# Patient Record
Sex: Female | Born: 2000 | Hispanic: No | Marital: Single | Smoking: Never smoker
Health system: Southern US, Community
[De-identification: ages and names within clinical notes are randomized; demographics above are authoritative.]

## PROBLEM LIST (undated history)

## (undated) DIAGNOSIS — K5900 Constipation, unspecified: Secondary | ICD-10-CM

## (undated) DIAGNOSIS — F419 Anxiety disorder, unspecified: Secondary | ICD-10-CM

## (undated) DIAGNOSIS — N12 Tubulo-interstitial nephritis, not specified as acute or chronic: Secondary | ICD-10-CM

## (undated) DIAGNOSIS — J45909 Unspecified asthma, uncomplicated: Secondary | ICD-10-CM

## (undated) DIAGNOSIS — D649 Anemia, unspecified: Secondary | ICD-10-CM

## (undated) DIAGNOSIS — T7840XA Allergy, unspecified, initial encounter: Secondary | ICD-10-CM

## (undated) DIAGNOSIS — F32A Depression, unspecified: Secondary | ICD-10-CM

## (undated) DIAGNOSIS — N390 Urinary tract infection, site not specified: Secondary | ICD-10-CM

## (undated) DIAGNOSIS — R109 Unspecified abdominal pain: Secondary | ICD-10-CM

## (undated) HISTORY — DX: Depression, unspecified: F32.A

## (undated) HISTORY — DX: Anxiety disorder, unspecified: F41.9

## (undated) HISTORY — DX: Constipation, unspecified: K59.00

## (undated) HISTORY — DX: Unspecified abdominal pain: R10.9

## (undated) HISTORY — PX: WISDOM TOOTH EXTRACTION: SHX21

## (undated) HISTORY — DX: Allergy, unspecified, initial encounter: T78.40XA

## (undated) HISTORY — DX: Anemia, unspecified: D64.9

---
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
## 2022-04-27 ENCOUNTER — Telehealth: Payer: Self-pay | Admitting: Family Medicine

## 2022-04-27 NOTE — Telephone Encounter (Signed)
Mom wants to [facility] from dr banks to Dr Ruthine Dose as she is an establish patient here at [facility].-   Please approve TOC

## 2022-04-27 NOTE — Telephone Encounter (Signed)
Please see message below and advise.

## 2022-04-28 NOTE — Telephone Encounter (Signed)
Ok

## 2022-05-16 NOTE — Telephone Encounter (Signed)
Vml for mom to call back and sch TOC with Ruthine Dose

## 2022-08-26 ENCOUNTER — Emergency Department (HOSPITAL_BASED_OUTPATIENT_CLINIC_OR_DEPARTMENT_OTHER): Payer: No Typology Code available for payment source

## 2022-08-26 ENCOUNTER — Other Ambulatory Visit: Payer: Self-pay

## 2022-08-26 ENCOUNTER — Emergency Department (HOSPITAL_BASED_OUTPATIENT_CLINIC_OR_DEPARTMENT_OTHER)
Admission: EM | Admit: 2022-08-26 | Discharge: 2022-08-26 | Disposition: A | Discharge status: Home / Self Care | Payer: No Typology Code available for payment source | Attending: Emergency Medicine | Admitting: Emergency Medicine

## 2022-08-26 ENCOUNTER — Encounter (HOSPITAL_BASED_OUTPATIENT_CLINIC_OR_DEPARTMENT_OTHER): Payer: Self-pay | Admitting: Emergency Medicine

## 2022-08-26 ENCOUNTER — Other Ambulatory Visit (HOSPITAL_BASED_OUTPATIENT_CLINIC_OR_DEPARTMENT_OTHER): Payer: Self-pay

## 2022-08-26 ENCOUNTER — Other Ambulatory Visit (HOSPITAL_COMMUNITY): Payer: Self-pay

## 2022-08-26 DIAGNOSIS — R202 Paresthesia of skin: Secondary | ICD-10-CM | POA: Insufficient documentation

## 2022-08-26 DIAGNOSIS — M542 Cervicalgia: Secondary | ICD-10-CM | POA: Insufficient documentation

## 2022-08-26 DIAGNOSIS — M79601 Pain in right arm: Secondary | ICD-10-CM | POA: Diagnosis not present

## 2022-08-26 DIAGNOSIS — R200 Anesthesia of skin: Secondary | ICD-10-CM

## 2022-08-26 LAB — BASIC METABOLIC PANEL
Anion gap: 8 (ref 5–15)
BUN: 9 mg/dL (ref 6–20)
CO2: 21 mmol/L — ABNORMAL LOW (ref 22–32)
Calcium: 9 mg/dL (ref 8.9–10.3)
Chloride: 108 mmol/L (ref 98–111)
Creatinine, Ser: 0.78 mg/dL (ref 0.44–1.00)
GFR, Estimated: >60 mL/min (ref >60)
Glucose, Bld: 105 mg/dL — ABNORMAL HIGH (ref 70–99)
Potassium: 4.1 mmol/L (ref 3.5–5.1)
Sodium: 137 mmol/L (ref 135–145)

## 2022-08-26 LAB — CBC
HCT: 36.7 % (ref 36.0–46.0)
Hemoglobin: 11.4 g/dL — ABNORMAL LOW (ref 12.0–15.0)
MCH: 25.4 pg — ABNORMAL LOW (ref 26.0–34.0)
MCHC: 31.1 g/dL (ref 30.0–36.0)
MCV: 81.7 fL (ref 80.0–100.0)
Platelets: 158 10*3/uL (ref 150–400)
RBC: 4.49 MIL/uL (ref 3.87–5.11)
RDW: 15 % (ref 11.5–15.5)
WBC: 6.1 10*3/uL (ref 4.0–10.5)
nRBC: 0 % (ref 0.0–0.2)

## 2022-08-26 LAB — HCG, SERUM, QUALITATIVE: Preg, Serum: NEGATIVE

## 2022-08-26 MED ORDER — KETOROLAC TROMETHAMINE 30 MG/ML IJ SOLN
30.0000 mg | Freq: Once | INTRAMUSCULAR | Status: AC
  Administered 2022-08-26: 30 mg via INTRAVENOUS
  Filled 2022-08-26: qty 1

## 2022-08-26 MED ORDER — GADOBUTROL 1 MMOL/ML IV SOLN
9.0000 mL | Freq: Once | INTRAVENOUS | Status: AC | PRN
  Administered 2022-08-26: 9 mL via INTRAVENOUS

## 2022-08-26 MED ORDER — IOHEXOL 350 MG/ML SOLN
100.0000 mL | Freq: Once | INTRAVENOUS | Status: AC | PRN
  Administered 2022-08-26: 75 mL via INTRAVENOUS

## 2022-08-26 MED ORDER — METHOCARBAMOL 500 MG PO TABS
500.0000 mg | ORAL_TABLET | Freq: Two times a day (BID) | ORAL | 0 refills | Status: DC
  Filled 2022-08-26 (×3): qty 20, 10d supply, fill #0

## 2022-08-26 MED ORDER — OXYCODONE-ACETAMINOPHEN 5-325 MG PO TABS
2.0000 | ORAL_TABLET | Freq: Once | ORAL | Status: DC
  Filled 2022-08-26: qty 2

## 2022-08-26 MED ORDER — MORPHINE SULFATE (PF) 4 MG/ML IV SOLN
4.0000 mg | Freq: Once | INTRAVENOUS | Status: AC
  Administered 2022-08-26: 4 mg via INTRAVENOUS
  Filled 2022-08-26: qty 1

## 2022-08-26 MED ORDER — PREDNISONE 10 MG PO TABS
ORAL_TABLET | Freq: Every day | ORAL | 0 refills | Status: DC
  Filled 2022-08-26 (×2): qty 42, 12d supply, fill #0

## 2022-08-26 NOTE — ED Provider Notes (Addendum)
[place] EMERGENCY DEPT Provider Note   CSN: [id number] Arrival date & time: 08/26/22  1001     History  Chief Complaint  Patient presents with   Neck Pain    Jessica Hendrix is a 21 y.o. female presents to the ER complaining of severe right-sided neck pain with radiation into her right arm and associated numbness.  Patient states she was getting ready this morning and felt a pop in her neck and instantly had pain and numbness.  She states she also felt lightheaded when she felt this pop.  She reports no prior history of spinal surgery or cervical spine issues.  Denies nausea, vomiting, back pain, syncope, speech difficulty, seizures, dizziness.        Home Medications Prior to Admission medications   Medication Sig Start Date End Date Taking? Authorizing Provider  methocarbamol (ROBAXIN) 500 MG tablet Take 1 tablet (500 mg total) by mouth 2 (two) times daily. 08/26/22  Yes Merek Niu R, PA  predniSONE (STERAPRED UNI-PAK 21 TAB) 10 MG (21) TBPK tablet Take by mouth daily. Take 6 tabs by mouth daily  for 2 days, then 5 tabs for 2 days, then 4 tabs for 2 days, then 3 tabs for 2 days, 2 tabs for 2 days, then 1 tab by mouth daily for 2 days 08/26/22  Yes Lamount Bankson R, PA  albuterol (VENTOLIN HFA) 108 (90 Base) MCG/ACT inhaler Inhale 1-2 puffs into the lungs every 6 (six) hours as needed for wheezing or shortness of breath. 02/03/21   Vanessa Kick, MD  Azelastine-Fluticasone 137-50 MCG/ACT SUSP PLACE 2 SPRAYS INTO BOTH NOSTRILS ONCE DAILY Patient taking differently: Place 2 sprays into both nostrils daily. 12/26/19 12/25/20  Kennith Gain, MD  COVID-19 At Home Antigen Test [facility] COVID-19 HOME TEST) KIT Use as directed 01/27/21   Edmon Crape, RPH  docusate sodium (COLACE) 100 MG capsule Take 1 capsule (100 mg total) by mouth every 12 (twelve) hours. 08/09/21   Horton, Barbette Hair, MD  doxycycline (VIBRA-TABS) 100 MG tablet Take 1 tablet (100 mg total) by  mouth 2 (two) times daily. 06/29/21   Lucretia Kern, DO  fluticasone (FLONASE) 50 MCG/ACT nasal spray Place 1 spray into both nostrils daily. 11/08/21   Billie Ruddy, MD  meloxicam (MOBIC) 7.5 MG tablet Take 1 tablet (7.5 mg total) by mouth daily. 06/03/21   Billie Ruddy, MD  polyethylene glycol (MIRALAX / GLYCOLAX) 17 g packet Take 17 g by mouth 2 (two) times daily. 08/09/21   Horton, Barbette Hair, MD      Allergies    Cinnamon, Penicillins, and Citrus    Review of Systems   Review of Systems  Gastrointestinal:  Negative for nausea and vomiting.  Musculoskeletal:  Positive for neck pain. Negative for back pain.  Neurological:  Positive for weakness, light-headedness, numbness and headaches. Negative for dizziness, seizures, syncope and speech difficulty.    Physical Exam Updated Vital Signs BP 128/76 (BP Location: Right Arm)   Pulse 70   Temp 98 F (36.7 C) (Oral)   Resp 11   Ht 5' 7" (1.702 m)   Wt 92.5 kg   LMP 08/25/2022   SpO2 100%   BMI 31.94 kg/m  Physical Exam Vitals and nursing note reviewed.  Constitutional:      General: She is not in acute distress.    Appearance: Normal appearance. She is not ill-appearing or diaphoretic.  Cardiovascular:     Rate and Rhythm: Normal rate and  regular rhythm.     Pulses: Normal pulses.  Pulmonary:     Effort: Pulmonary effort is normal.  Musculoskeletal:     Cervical back: Tenderness and bony tenderness present. Pain with movement present. Decreased range of motion.     Thoracic back: Normal. No tenderness.     Comments: Patient has decreased range of motion and severe tenderness to palpation on exam.  She is unable to turn her head due to pain.  Neurological:     Mental Status: She is alert. Mental status is at baseline.     Sensory: Sensory deficit present.     Comments: Patient has decreased sensation to right arm with light touch when compared to left.  She also has decreased grip strength on the right side.  She has  limited range of motion of her right arm due to pain.   Psychiatric:        Mood and Affect: Mood normal.        Behavior: Behavior normal.     ED Results / Procedures / Treatments   Labs (all labs ordered are listed, but only abnormal results are displayed) Labs Reviewed  BASIC METABOLIC PANEL - Abnormal; Notable for the following components:      Result Value   CO2 21 (*)    Glucose, Bld 105 (*)    All other components within normal limits  CBC - Abnormal; Notable for the following components:   Hemoglobin 11.4 (*)    MCH 25.4 (*)    All other components within normal limits  HCG, SERUM, QUALITATIVE    EKG None  Radiology MR Cervical Spine W or Wo Contrast  Result Date: 08/26/2022 CLINICAL DATA:  Myelopathy, acute, cervical spine. Right-sided neck pain and right upper extremity pain EXAM: MRI CERVICAL SPINE WITHOUT AND WITH CONTRAST TECHNIQUE: Multiplanar and multiecho pulse sequences of the cervical spine, to include the craniocervical junction and cervicothoracic junction, were obtained without and with intravenous contrast. CONTRAST:  20m GADAVIST GADOBUTROL 1 MMOL/ML IV SOLN COMPARISON:  Same day CT FINDINGS: Alignment: Physiologic. Vertebrae: No fracture, evidence of discitis, or bone lesion. Intact spinal ligamentous structures. Cord: Normal signal and morphology. Posterior Fossa, vertebral arteries, paraspinal tissues: Negative. No intramuscular edema. Disc levels: C2-C3: Unremarkable. C3-C4: No disc protrusion. Minimal left uncovertebral spurring resulting in mild-to-moderate left foraminal stenosis. No canal stenosis. C4-C5: No disc protrusion. Minimal uncovertebral spurring contributing to mild bilateral foraminal stenosis. No canal stenosis. C5-C6: No disc protrusion. Minimal uncovertebral spurring resulting in mild bilateral foraminal stenosis. No canal stenosis. C6-C7: Unremarkable. C7-T1: Unremarkable. IMPRESSION: 1. No acute osseous or ligamentous injury of the cervical  spine. 2. Mild-to-moderate left foraminal stenosis at C3-4 and mild bilateral foraminal stenosis at C4-5 and C5-6. 3. No focal disc protrusion or canal stenosis at any level. Electronically Signed   By: NDavina PokeD.O.   On: 08/26/2022 15:23   CT ANGIO HEAD NECK W WO CM  Result Date: 08/26/2022 CLINICAL DATA:  Felt a pop followed by instant pain in right neck, arm, and side. EXAM: CT ANGIOGRAPHY HEAD AND NECK TECHNIQUE: Multidetector CT imaging of the head and neck was performed using the standard protocol during bolus administration of intravenous contrast. Multiplanar CT image reconstructions and MIPs were obtained to evaluate the vascular anatomy. Carotid stenosis measurements (when applicable) are obtained utilizing NASCET criteria, using the distal internal carotid diameter as the denominator. RADIATION DOSE REDUCTION: This exam was performed according to the departmental dose-optimization program which includes automated exposure control, adjustment  of the mA and/or kV according to patient size and/or use of iterative reconstruction technique. CONTRAST:  71m OMNIPAQUE IOHEXOL 350 MG/ML SOLN COMPARISON:  None Available. FINDINGS: CT HEAD FINDINGS Brain: No acute intracranial hemorrhage, extra-axial fluid collection, or acute infarct Parenchymal volume is normal. The ventricles are normal in size. Gray-white differentiation is preserved There is no mass lesion.  There is no mass effect or midline shift. Vascular: See below. Skull: Normal. Negative for fracture or focal lesion. Sinuses/Orbits: The paranasal sinuses are clear. The globes and orbits are unremarkable. Other: None. Review of the MIP images confirms the above findings CTA NECK FINDINGS Aortic arch: The aortic arch is normal. The origins of the major branch vessels are patent. The subclavian arteries are patent to the level imaged. Right carotid system: The right common, internal, and external carotid arteries are patent, without  hemodynamically significant stenosis or occlusion. There is no dissection or aneurysm. Left carotid system: The left common, internal, and external carotid arteries are patent, without hemodynamically significant stenosis or occlusion. There is no dissection or aneurysm. Vertebral arteries: The vertebral arteries are patent, without hemodynamically significant stenosis or occlusion. There is no dissection or aneurysm. Skeleton: There is no acute osseous abnormality or suspicious osseous lesion. There is no visible canal hematoma. Other neck: The soft tissues of the neck are unremarkable. Upper chest: The imaged lung apices are clear. Review of the MIP images confirms the above findings CTA HEAD FINDINGS Anterior circulation: The intracranial ICAs are normal. The bilateral MCAs are patent, without proximal stenosis or occlusion. The distal branches are poorly evaluated. The bilateral ACAs are patent, without proximal stenosis or occlusion. The anterior communicating artery is not definitely seen. The distal branches are poorly evaluated. There is no aneurysm or AVM. Posterior circulation: The bilateral V4 segments are patent. The basilar artery is patent. PICA is not definitely seen on the right. The other major cerebellar arteries appear patent. The bilateral PCAs are patent, without proximal stenosis or occlusion. There is no aneurysm or AVM. Venous sinuses: Not well evaluated. Anatomic variants: None. Review of the MIP images confirms the above findings IMPRESSION: 1. Normal noncontrast head CT with no acute intracranial pathology. 2. Normal vasculature of the head and neck. Electronically Signed   By: PValetta MoleM.D.   On: 08/26/2022 12:17    Procedures Procedures    Medications Ordered in ED Medications  morphine (PF) 4 MG/ML injection 4 mg (4 mg Intravenous Given 08/26/22 1111)  iohexol (OMNIPAQUE) 350 MG/ML injection 100 mL (75 mLs Intravenous Contrast Given 08/26/22 1126)  ketorolac (TORADOL) 30  MG/ML injection 30 mg (30 mg Intravenous Given 08/26/22 1421)  gadobutrol (GADAVIST) 1 MMOL/ML injection 9 mL (9 mLs Intravenous Contrast Given 08/26/22 1508)    ED Course/ Medical Decision Making/ A&P                           Medical Decision Making Amount and/or Complexity of Data Reviewed Labs: ordered. Radiology: ordered.  Risk Prescription drug management.   This patient presents to the ED with chief complaint(s) of sudden onset severe neck pain with headache and right arm numbness.  She has no prior history of cervical injury or spinal surgery.  She has been unable to move her head and it is painful to move her right arm since it started.     The differential diagnosis includes herniated disc, acute cervical strain, brachial plexus injury, compression fracture   The initial plan  is to obtain baseline labs and CTA head neck  Initial Assessment:   On exam, patient appears to be in significant pain and is unable to move her head or neck.  She has no bony tenderness over the cervical spine.    Independent ECG/labs interpretation:  The following labs were independently interpreted:  CBC significant for mild anemia, hemoglobin 11.4. Metabolic panel shows no major electrolyte disturbances, glucose mildly elevated at 105.  She has normal kidney function.  Independent visualization and interpretation of imaging: I independently visualized the following imaging with scope of interpretation limited to determining acute life threatening conditions related to emergency care: CTA head neck, which revealed normal vasculature of the head and neck.  I agree with radiologist interpretation.  Treatment and Reassessment: Will treat patient's pain with IV morphine and IV Toradol.  CTA was negative, however, patient continues to have numbness in her right arm and feels that she cannot move her head or neck.  Plan to obtain MR of cervical spine to rule out nerve injury.   MRI reveals mild to  moderate foraminal stenosis of the cervical spine without canal stenosis, disc protrusion, or ligamentous injury.    Patient reports her pain improved following IV Toradol.   Disposition:   After consideration of the diagnostic results and the patients response to treatment, I feel that emergency department workup does not suggest an emergent condition requiring admission or immediate intervention beyond what has been performed at this time.  The patient is safe for discharge and has been instructed to return immediately for worsening symptoms, change in symptoms or any other concerns.  Discussed supportive care measures for neck pain at home and recommended primary care physician follow-up.  Will send patient home on steroid taper and Robaxin to help with symptoms.        Final Clinical Impression(s) / ED Diagnoses Final diagnoses:  Acute neck pain  Right arm pain  Right arm numbness    Rx / DC Orders ED Discharge Orders          Ordered    methocarbamol (ROBAXIN) 500 MG tablet  2 times daily        08/26/22 1539    predniSONE (STERAPRED UNI-PAK 21 TAB) 10 MG (21) TBPK tablet  Daily        08/26/22 1539              Pat Kocher, [place] 08/26/22 1540    Theressa Stamps [place], [place] 08/26/22 1542    Sherwood Gambler, MD 08/30/22 1610

## 2022-08-26 NOTE — Discharge Instructions (Addendum)
Thank you for allowing me to be part of your care today.  You were seen in the ER for neck and arm pain.  Your imaging was reassuring and you do not require further work up or imaging at this time.    I am sending you home on a muscle relaxer to help with your neck and arm pain and a steroid taper.  Avoid taking other NSAIDs while taking the steroid.  You may also continue to use heating pad or ice (whichever feels better).  I also recommend using OTC lidocaine patches in the areas where you have the most pain.    Please follow up with your primary care physician if your symptoms persist or worsen.   Return to the ER if you develop new or worsening symptoms or have any new concerns.

## 2022-08-26 NOTE — ED Triage Notes (Signed)
Pt was getting ready this morning and felt a pop and instant pain to right neck, arm and side.

## 2022-08-26 NOTE — ED Notes (Signed)
Discharge instructions, follow up care, and prescriptions reviewed and explained, pt verbalized understanding.  

## 2022-08-26 NOTE — ED Notes (Signed)
Pt transported to MRI with RAD tech via wheelchair.

## 2022-09-07 ENCOUNTER — Other Ambulatory Visit (HOSPITAL_COMMUNITY): Payer: Self-pay

## 2022-09-07 ENCOUNTER — Ambulatory Visit (INDEPENDENT_AMBULATORY_CARE_PROVIDER_SITE_OTHER): Payer: No Typology Code available for payment source | Admitting: Family Medicine

## 2022-09-07 ENCOUNTER — Encounter: Payer: Self-pay | Admitting: Family Medicine

## 2022-09-07 VITALS — BP 100/60 | HR 71 | Temp 98.3°F | Ht 67.0 in | Wt 193.1 lb

## 2022-09-07 DIAGNOSIS — J4540 Moderate persistent asthma, uncomplicated: Secondary | ICD-10-CM | POA: Diagnosis not present

## 2022-09-07 DIAGNOSIS — M5412 Radiculopathy, cervical region: Secondary | ICD-10-CM

## 2022-09-07 MED ORDER — IBUPROFEN 600 MG PO TABS
600.0000 mg | ORAL_TABLET | Freq: Three times a day (TID) | ORAL | 1 refills | Status: DC | PRN
  Filled 2022-09-07: qty 30, 10d supply, fill #0

## 2022-09-07 MED ORDER — AZELASTINE-FLUTICASONE 137-50 MCG/ACT NA SUSP
2.0000 | Freq: Every day | NASAL | 5 refills | Status: DC
  Filled 2022-09-07: qty 23, 30d supply, fill #0

## 2022-09-07 MED ORDER — BUDESONIDE-FORMOTEROL FUMARATE 80-4.5 MCG/ACT IN AERO
2.0000 | INHALATION_SPRAY | Freq: Two times a day (BID) | RESPIRATORY_TRACT | 3 refills | Status: DC
  Filled 2022-09-07: qty 10.2, 30d supply, fill #0

## 2022-09-07 MED ORDER — ALBUTEROL SULFATE HFA 108 (90 BASE) MCG/ACT IN AERS
1.0000 | INHALATION_SPRAY | Freq: Four times a day (QID) | RESPIRATORY_TRACT | 1 refills | Status: DC | PRN
  Filled 2022-09-07: qty 6.7, 25d supply, fill #0

## 2022-09-07 NOTE — Patient Instructions (Signed)
It was very nice to see you today!  [facility]  [address] on the 1st floor Phone number [phone]    PLEASE NOTE:  If you had any lab tests please let us know if you have not heard back within a few days. You may see your results on MyChart before we have a chance to review them but we will give you a call once they are reviewed by [place]. If we ordered any referrals today, please let us know if you have not heard from their office within the next week.   Please try these tips to maintain a healthy lifestyle:  Eat most of your calories during the day when you are active. Eliminate processed foods including packaged sweets (pies, cakes, cookies), reduce intake of potatoes, white bread, white pasta, and white rice. Look for whole grain options, oat flour or almond flour.  Each meal should contain half fruits/vegetables, one quarter protein, and one quarter carbs (no bigger than a computer mouse).  Cut down on sweet beverages. This includes juice, soda, and sweet tea. Also watch fruit intake, though this is a healthier sweet option, it still contains natural sugar! Limit to 3 servings daily.  Drink at least 1 glass of water with each meal and aim for at least 8 glasses per day  Exercise at least 150 minutes every week.

## 2022-09-07 NOTE — Progress Notes (Signed)
Subjective:     Patient ID: Jessica Hendrix, female    DOB: 01/27/2001, 21 y.o.   MRN: [id number]  Chief Complaint  Patient presents with   Establish Care    Need new pcp ER follow-up on 08/26/22 for neck injury causing pain and numbness in right arm    HPI TOC from [place]  Asthma-using inhaler-daily at least.never on ICS Was walking out of house 08/26/22, and turned neck and "crunch" and sharp pain shooting down arm and up to head.  Went to ER-placed on muscle relaxor and prednisone-helped.  Still getting pain and shooting down R arm and to head.  Some n/t  down into fingers and weakness, color can get darker and fingers can swell esp if moving a lot. No spasms in hand. If turns head to right, zings down arm.  Has to sleep in a position that not turn head to R  Health Maintenance Due  Topic Date Due   CHLAMYDIA SCREENING  Never done   HIV Screening  Never done   Hepatitis C Screening  Never done   DTaP/Tdap/Td (1 - Tdap) Never done    Past Medical History:  Diagnosis Date   Allergy    Anemia    Anxiety    Constipation    Depression    Stomach discomfort     History reviewed. No pertinent surgical history.  Outpatient Medications Prior to Visit  Medication Sig Dispense Refill   CRANBERRY PO Take by mouth daily.     methocarbamol (ROBAXIN) 500 MG tablet Take 1 tablet (500 mg total) by mouth 2 (two) times daily. 20 tablet 0   Multiple Vitamins-Minerals (ZINC PO) Take by mouth daily.     predniSONE (DELTASONE) 10 MG tablet Take 6 tabs by mouth daily  for 2 days, then 5 tabs for 2 days, then 4 tabs for 2 days, then 3 tabs for 2 days, 2 tabs for 2 days, then 1 tab by mouth daily for 2 days 42 tablet 0   albuterol (VENTOLIN HFA) 108 (90 Base) MCG/ACT inhaler Inhale 1-2 puffs into the lungs every 6 (six) hours as needed for wheezing or shortness of breath. 18 g 1   fluticasone (FLONASE) 50 MCG/ACT nasal spray Place 1 spray into both nostrils daily. (Patient not taking:  Reported on 09/07/2022) 16 g 0   Azelastine-Fluticasone 137-50 MCG/ACT SUSP PLACE 2 SPRAYS INTO BOTH NOSTRILS ONCE DAILY (Patient not taking: Reported on 09/07/2022) 23 g 5   COVID-19 At Home Antigen Test ([facility] COVID-19 HOME TEST) KIT Use as directed (Patient not taking: Reported on 09/07/2022) 4 each 0   docusate sodium (COLACE) 100 MG capsule Take 1 capsule (100 mg total) by mouth every 12 (twelve) hours. (Patient not taking: Reported on 09/07/2022) 60 capsule 0   doxycycline (VIBRA-TABS) 100 MG tablet Take 1 tablet (100 mg total) by mouth 2 (two) times daily. (Patient not taking: Reported on 09/07/2022) 20 tablet 0   meloxicam (MOBIC) 7.5 MG tablet Take 1 tablet (7.5 mg total) by mouth daily. (Patient not taking: Reported on 09/07/2022) 30 tablet 0   polyethylene glycol (MIRALAX / GLYCOLAX) 17 g packet Take 17 g by mouth 2 (two) times daily. (Patient not taking: Reported on 09/07/2022) 14 each 0   No facility-administered medications prior to visit.    Allergies  Allergen Reactions   Cinnamon     spices   Penicillins Other (See Comments)    unk   Citrus Rash   ROS neg/noncontributory except  as noted HPI/below  Psych-some down, depression, anx recently.  No SI.  Therapy in past-not want now and not want meds.  Discussed resources.     Objective:     BP 100/60   Pulse 71   Temp 98.3 F (36.8 C) (Temporal)   Ht _0  (1.702 m)   Wt 193 lb 2 oz (87.6 kg)   LMP 08/25/2022   SpO2 99%   BMI 30.25 kg/m  Wt Readings from Last 3 Encounters:  09/07/22 193 lb 2 oz (87.6 kg) (97 %, Z= 1.83)*  08/26/22 203 lb 14.8 oz (92.5 kg) (98 %, Z= 2.00)*  11/08/21 204 lb (92.5 kg) (98 %, Z= 2.01)*   * Growth percentiles are based on CDC (Girls, 2-20 Years) data.    Physical Exam   Gen: WDWN NAD HEENT: NCAT, conjunctiva not injected, sclera nonicteric NECK:  supple, no thyromegaly, no nodes, no carotid bruits CARDIAC: RRR, S1S2+, no murmur. DP 2+B LUNGS: CTAB. No wheezes EXT:  sl  swelling R hand MSK: no TTP cspine.  +tight tender muscles R neck.  Decreased grip on R compared to L.  Ms 5/5 bue but limited lift d/t pain on R.  Dec rom r arm d/t pain.  Couldn't eval radial pulse overhead d/t pain arm.   NEURO: A&O x3.  CN II-XII intact.  PSYCH: normal mood. Good eye contact  Reviewed ER records    Assessment & Plan:   Problem List Items Addressed This Visit   None Visit Diagnoses     Cervical radiculopathy    -  Primary   Moderate persistent asthma without complication       Relevant Medications   albuterol (VENTOLIN HFA) 108 (90 Base) MCG/ACT inhaler   budesonide-formoterol (SYMBICORT) 80-4.5 MCG/ACT inhaler     1  cervical radiculopathy on R.  Some improvement but still bad.  Ibuprofen 62m tid.  See sports med, ?cerv rib or other or just needs more time, PT.   2.  Asthma-not controlled.  Using inhaler daily and more.  Will start maintenance-symbicort 80 bid rinse mouth after.  F/u 3 mo.  Renewed albuterol as well.    Meds ordered this encounter  Medications   Azelastine-Fluticasone 137-50 MCG/ACT SUSP    Sig: PLACE 2 SPRAYS INTO BOTH NOSTRILS ONCE DAILY    Dispense:  23 g    Refill:  5   albuterol (VENTOLIN HFA) 108 (90 Base) MCG/ACT inhaler    Sig: Inhale 1-2 puffs into the lungs every 6 (six) hours as needed for wheezing or shortness of breath.    Dispense:  6.7 g    Refill:  1   budesonide-formoterol (SYMBICORT) 80-4.5 MCG/ACT inhaler    Sig: Inhale 2 puffs into the lungs 2 (two) times daily.    Dispense:  1 each    Refill:  3   ibuprofen (ADVIL) 600 MG tablet    Sig: Take 1 tablet (600 mg total) by mouth every 8 (eight) hours as needed.    Dispense:  30 tablet    Refill:  1    AWellington Hampshire MD

## 2022-09-15 ENCOUNTER — Other Ambulatory Visit (HOSPITAL_COMMUNITY): Payer: Self-pay

## 2022-11-08 NOTE — Progress Notes (Unsigned)
    Benito Mccreedy D.[place] Phone: [phone]   Assessment and Plan:     There are no diagnoses linked to this encounter.  ***   Pertinent previous records reviewed include ***   Follow Up: ***     Subjective:   I, Jessica Hendrix, am serving as a Education administrator for Doctor Glennon Mac  Chief Complaint: shoulder , neck pain   HPI:   11/09/2022 Patient is a 22 year old female complaining of shoulder and neck pain. Patient states ight-sided neck pain with radiation into her right arm and associated numbness. Patient states she was getting ready this morning and felt a pop in her neck and instantly had pain and numbness. She states she also felt lightheaded when she felt this pop. She reports no prior history of spinal surgery or cervical spine issues.   Was walking out of house 08/26/22, and turned neck and "crunch" and sharp pain shooting down arm and up to head. Went to ER-placed on muscle relaxor and prednisone-helped. Still getting pain and shooting down R arm and to head. Some n/t down into fingers and weakness, color can get darker and fingers can swell esp if moving a lot. No spasms in hand. If turns head to right, zings down arm. Has to sleep in a position that not turn head to R   Relevant Historical Information: ***  Additional pertinent review of systems negative.   Current Outpatient Medications:    albuterol (VENTOLIN HFA) 108 (90 Base) MCG/ACT inhaler, Inhale 1-2 puffs into the lungs every 6 (six) hours as needed for wheezing or shortness of breath., Disp: 6.7 g, Rfl: 1   Azelastine-Fluticasone 137-50 MCG/ACT SUSP, Place 2 sprays into both nostrils daily., Disp: 23 g, Rfl: 5   budesonide-formoterol (SYMBICORT) 80-4.5 MCG/ACT inhaler, Inhale 2 puffs into the lungs 2 (two) times daily., Disp: 10.2 g, Rfl: 3   CRANBERRY PO, Take by mouth daily., Disp: , Rfl:    fluticasone (FLONASE) 50 MCG/ACT nasal  spray, Place 1 spray into both nostrils daily. (Patient not taking: Reported on 09/07/2022), Disp: 16 g, Rfl: 0   ibuprofen (ADVIL) 600 MG tablet, Take 1 tablet (600 mg total) by mouth every 8 (eight) hours as needed., Disp: 30 tablet, Rfl: 1   methocarbamol (ROBAXIN) 500 MG tablet, Take 1 tablet (500 mg total) by mouth 2 (two) times daily., Disp: 20 tablet, Rfl: 0   Multiple Vitamins-Minerals (ZINC PO), Take by mouth daily., Disp: , Rfl:    predniSONE (DELTASONE) 10 MG tablet, Take 6 tabs by mouth daily  for 2 days, then 5 tabs for 2 days, then 4 tabs for 2 days, then 3 tabs for 2 days, 2 tabs for 2 days, then 1 tab by mouth daily for 2 days, Disp: 42 tablet, Rfl: 0   Objective:     There were no vitals filed for this visit.    There is no height or weight on file to calculate BMI.    Physical Exam:    ***   Electronically signed by:  Benito Mccreedy D.[facility] 7:41 AM 11/08/22

## 2022-11-09 ENCOUNTER — Ambulatory Visit: Payer: 59 | Admitting: Sports Medicine

## 2022-11-09 ENCOUNTER — Other Ambulatory Visit (HOSPITAL_COMMUNITY): Payer: Self-pay

## 2022-11-09 VITALS — BP 118/80 | Ht 67.0 in | Wt 201.0 lb

## 2022-11-09 DIAGNOSIS — R29898 Other symptoms and signs involving the musculoskeletal system: Secondary | ICD-10-CM

## 2022-11-09 DIAGNOSIS — M542 Cervicalgia: Secondary | ICD-10-CM | POA: Diagnosis not present

## 2022-11-09 DIAGNOSIS — L819 Disorder of pigmentation, unspecified: Secondary | ICD-10-CM

## 2022-11-09 DIAGNOSIS — M25511 Pain in right shoulder: Secondary | ICD-10-CM

## 2022-11-09 MED ORDER — MELOXICAM 15 MG PO TABS
15.0000 mg | ORAL_TABLET | Freq: Every day | ORAL | 0 refills | Status: DC
  Filled 2022-11-09: qty 30, 30d supply, fill #0

## 2022-11-09 MED ORDER — CYCLOBENZAPRINE HCL 5 MG PO TABS
5.0000 mg | ORAL_TABLET | Freq: Every day | ORAL | 0 refills | Status: DC
  Filled 2022-11-09: qty 30, 30d supply, fill #0

## 2022-11-09 NOTE — Patient Instructions (Addendum)
Good to see you  - Start meloxicam 15 mg daily x2 weeks.  If still having pain after 2 weeks, complete 3rd-week of meloxicam. May use remaining meloxicam as needed once daily for pain control.  Do not to use additional NSAIDs while taking meloxicam.  May use Tylenol [phone] mg 2 to 3 times a day for breakthrough pain. Flexeril 5-10 mg nightly as needed Work note provided no lifting more than 5 pounds with right arm can use sling as needed MRI brachial plexus right w wo  Follow up 3 days after MRI to discuss results Labs at your convenience

## 2022-11-17 ENCOUNTER — Other Ambulatory Visit (HOSPITAL_COMMUNITY): Payer: Self-pay

## 2022-12-23 ENCOUNTER — Other Ambulatory Visit (HOSPITAL_COMMUNITY): Payer: Self-pay

## 2023-01-06 ENCOUNTER — Ambulatory Visit: Payer: 59 | Admitting: Family Medicine

## 2023-01-13 ENCOUNTER — Ambulatory Visit (INDEPENDENT_AMBULATORY_CARE_PROVIDER_SITE_OTHER): Payer: 59 | Admitting: Family Medicine

## 2023-01-13 ENCOUNTER — Other Ambulatory Visit (HOSPITAL_COMMUNITY): Payer: Self-pay

## 2023-01-13 VITALS — BP 110/72 | HR 78 | Temp 98.2°F | Ht 67.0 in | Wt 217.4 lb

## 2023-01-13 DIAGNOSIS — J4540 Moderate persistent asthma, uncomplicated: Secondary | ICD-10-CM | POA: Diagnosis not present

## 2023-01-13 DIAGNOSIS — R21 Rash and other nonspecific skin eruption: Secondary | ICD-10-CM | POA: Diagnosis not present

## 2023-01-13 DIAGNOSIS — N926 Irregular menstruation, unspecified: Secondary | ICD-10-CM

## 2023-01-13 LAB — LIPID PANEL
Cholesterol: 146 mg/dL (ref 0–200)
HDL: 48.9 mg/dL (ref >39.00)
LDL Cholesterol: 87 mg/dL (ref 0–99)
NonHDL: 97.22
Total CHOL/HDL Ratio: 3
Triglycerides: 51 mg/dL (ref 0.0–149.0)
VLDL: 10.2 mg/dL (ref 0.0–40.0)

## 2023-01-13 LAB — COMPREHENSIVE METABOLIC PANEL
ALT: 26 U/L (ref 0–35)
AST: 22 U/L (ref 0–37)
Albumin: 4.2 g/dL (ref 3.5–5.2)
Alkaline Phosphatase: 69 U/L (ref 39–117)
BUN: 15 mg/dL (ref 6–23)
CO2: 26 mEq/L (ref 19–32)
Calcium: 9.2 mg/dL (ref 8.4–10.5)
Chloride: 103 mEq/L (ref 96–112)
Creatinine, Ser: 0.89 mg/dL (ref 0.40–1.20)
GFR: 93.51 mL/min (ref >60.00)
Glucose, Bld: 93 mg/dL (ref 70–99)
Potassium: 4.2 mEq/L (ref 3.5–5.1)
Sodium: 137 mEq/L (ref 135–145)
Total Bilirubin: 0.3 mg/dL (ref 0.2–1.2)
Total Protein: 6.9 g/dL (ref 6.0–8.3)

## 2023-01-13 LAB — CBC WITH DIFFERENTIAL/PLATELET
Basophils Absolute: 0 10*3/uL (ref 0.0–0.1)
Basophils Relative: 0.5 % (ref 0.0–3.0)
Eosinophils Absolute: 0.1 10*3/uL (ref 0.0–0.7)
Eosinophils Relative: 1.1 % (ref 0.0–5.0)
HCT: 35.7 % — ABNORMAL LOW (ref 36.0–46.0)
Hemoglobin: 11.4 g/dL — ABNORMAL LOW (ref 12.0–15.0)
Lymphocytes Relative: 26.4 % (ref 12.0–46.0)
Lymphs Abs: 1.8 10*3/uL (ref 0.7–4.0)
MCHC: 31.9 g/dL (ref 30.0–36.0)
MCV: 79.9 fl (ref 78.0–100.0)
Monocytes Absolute: 0.5 10*3/uL (ref 0.1–1.0)
Monocytes Relative: 7.3 % (ref 3.0–12.0)
Neutro Abs: 4.4 10*3/uL (ref 1.4–7.7)
Neutrophils Relative %: 64.7 % (ref 43.0–77.0)
Platelets: 200 10*3/uL (ref 150.0–400.0)
RBC: 4.47 Mil/uL (ref 3.87–5.11)
RDW: 15.3 % — ABNORMAL HIGH (ref 11.5–14.6)
WBC: 6.8 10*3/uL (ref 4.5–10.5)

## 2023-01-13 LAB — TSH: TSH: 2.91 u[IU]/mL (ref 0.35–5.50)

## 2023-01-13 LAB — HEMOGLOBIN A1C: Hgb A1c MFr Bld: 6 % (ref 4.6–6.5)

## 2023-01-13 MED ORDER — BUDESONIDE-FORMOTEROL FUMARATE 80-4.5 MCG/ACT IN AERO
2.0000 | INHALATION_SPRAY | Freq: Two times a day (BID) | RESPIRATORY_TRACT | 3 refills | Status: DC
  Filled 2023-01-13: qty 10.2, 30d supply, fill #0

## 2023-01-13 MED ORDER — ALBUTEROL SULFATE HFA 108 (90 BASE) MCG/ACT IN AERS
1.0000 | INHALATION_SPRAY | Freq: Four times a day (QID) | RESPIRATORY_TRACT | 1 refills | Status: DC | PRN
  Filled 2023-01-13: qty 6.7, 25d supply, fill #0

## 2023-01-13 NOTE — Patient Instructions (Signed)
Pityriasis rosea.  Check on inhalers if not covered  Schedule with gyn

## 2023-01-13 NOTE — Progress Notes (Signed)
Subjective:     Patient ID: Jessica Hendrix, female    DOB: 11-Jul-2001, 22 y.o.   MRN: [id number]  Chief Complaint  Patient presents with   Skin Concern    Started a couple of weeks ago, denies any change in skin products.    Menstrual Problem    Pt is considering appt with GYN; cycles are irregular and heavy. LMP 4/4. Pls refer to [facility] (Dr[facility]) or [facility] (Dr. Hoover Browns)    HPI-here w/mom  Skin concerns-past 2-3 weeks-chest and now more places.  Dry and itchy.  Left upper thigh patch about 1.72months.  does get intermitt dry patches. No new soaps, etc. Dove. Tide clear.  Used cortisone once.  Menstrual issues-irreg and heavy.  All over the place. Can last 10 days or just spotting.  For years. Heavy at times-pads and tampons every 3 hrs. Can have clots. Patient will schedule w/gynecology.  Asthma-using albterol more again.  Never filled Symbicort due to copay.   Health Maintenance Due  Topic Date Due   CHLAMYDIA SCREENING  Never done   HIV Screening  Never done   Hepatitis C Screening  Never done   DTaP/Tdap/Td (1 - Tdap) Never done   COVID-19 Vaccine (3 - 2023-24 season) 05/27/2022    Past Medical History:  Diagnosis Date   Allergy    Anemia    Anxiety    Constipation    Depression    Stomach discomfort     History reviewed. No pertinent surgical history.   Current Outpatient Medications:    albuterol (VENTOLIN HFA) 108 (90 Base) MCG/ACT inhaler, Inhale 1 - 2 puffs into the lungs every 6 hours as needed for wheezing or shortness of breath., Disp: 6.7 g, Rfl: 1   Azelastine-Fluticasone 137-50 MCG/ACT SUSP, Place 2 sprays into both nostrils daily. (Patient not taking: Reported on 01/13/2023), Disp: 23 g, Rfl: 5   budesonide-formoterol (SYMBICORT) 80-4.5 MCG/ACT inhaler, Inhale 2 puffs into the lungs 2 times daily., Disp: 10.2 g, Rfl: 3  Allergies  Allergen Reactions   Cinnamon     spices   Penicillins Other (See Comments)    unk   Citrus  Rash   ROS neg/noncontributory except as noted HPI/below      Objective:     BP 110/72   Pulse 78   Temp 98.2 F (36.8 C)   Ht  (1.702 m)   Wt 217 lb 6.4 oz (98.6 kg)   SpO2 99%   BMI 34.05 kg/m  Wt Readings from Last 3 Encounters:  01/13/23 217 lb 6.4 oz (98.6 kg)  11/09/22 201 lb (91.2 kg) (97 %, Z= 1.95)*  09/07/22 193 lb 2 oz (87.6 kg) (97 %, Z= 1.83)*   * Growth percentiles are based on CDC (Girls, 2-20 Years) data.    Physical Exam   Gen: WDWN NAD HEENT: NCAT, conjunctiva not injected, sclera nonicteric NECK:  supple, no thyromegaly, no nodes, no carotid bruits CARDIAC: RRR, S1S2+, no murmur. DP 2+B LUNGS: CTAB. No wheezes ABDOMEN:  BS+, soft, NTND, No HSM, no masses EXT:  no edema MSK: no gross abnormalities.  NEURO: A&O x3.  CN II-XII intact.  PSYCH: normal mood. Good eye contact Left upper thigh ant-approx 2.5cm pink sl scaley patch.  Chest/back-scattered pink, scaley patches fairly diffuse.   Drank juice 1 hour ago     Assessment & Plan:  Rash  Moderate persistent asthma without complication  Irregular menses -     Lipid  panel -     Comprehensive metabolic panel -     CBC with Differential/Platelet -     Hemoglobin A1c -     TSH  Other orders -     Budesonide-Formoterol Fumarate; Inhale 2 puffs into the lungs 2 times daily.  Dispense: 10.2 g; Refill: 3 -     Albuterol Sulfate HFA; Inhale 1 - 2 puffs into the lungs every 6 hours as needed for wheezing or shortness of breath.  Dispense: 6.7 g; Refill: 1   Rash-suspect pityriasis rosea-discussed self=limited and should resolve in 1 month more or so.  Can use cortisone cream if makes it feel better.  Can see derm if not resolving or opinion.  Irreg menses whole life.  Suspect poss pcos-patient wants to research and find gynecology.  Will check labs to rule out other causes.  Asthma-chronic.  Not controlled.  Not on maintenance discussed importance. Patient will check into insurance and issues  w/high complaining of-pay and mom stated she would help  renewed Symbicort.  Filled albuterol as well  follow up 80m  Angelena Sole, MD

## 2023-01-14 ENCOUNTER — Other Ambulatory Visit (HOSPITAL_COMMUNITY): Payer: Self-pay

## 2023-01-14 NOTE — Progress Notes (Signed)
Labs okay except: 1.  Still a bit anemic-take vitamin with iron daily.  Follow-up with GYN.  We will repeat this when I see back in 3 months. 2.A1C(3 month average of sugars) is elevated.  This is considered PreDiabetes.  Work on diet-decrease sugars and starches and aim for 30 minutes of exercise 5 days/week to prevent progression to diabetes

## 2023-01-31 ENCOUNTER — Telehealth: Payer: Self-pay | Admitting: Family Medicine

## 2023-01-31 NOTE — Telephone Encounter (Signed)
FYI

## 2023-01-31 NOTE — Telephone Encounter (Signed)
Patient states she wants Dr. Ruthine Dose to know that she has chosen [facility] as her [facility]necology office

## 2023-02-05 ENCOUNTER — Other Ambulatory Visit: Payer: Self-pay

## 2023-02-05 ENCOUNTER — Encounter (HOSPITAL_BASED_OUTPATIENT_CLINIC_OR_DEPARTMENT_OTHER): Payer: Self-pay | Admitting: Emergency Medicine

## 2023-02-05 ENCOUNTER — Emergency Department (HOSPITAL_BASED_OUTPATIENT_CLINIC_OR_DEPARTMENT_OTHER)
Admission: EM | Admit: 2023-02-05 | Discharge: 2023-02-06 | Disposition: A | Discharge status: Home / Self Care | Payer: 59 | Attending: Emergency Medicine | Admitting: Emergency Medicine

## 2023-02-05 DIAGNOSIS — R319 Hematuria, unspecified: Secondary | ICD-10-CM

## 2023-02-05 DIAGNOSIS — Z3A01 Less than 8 weeks gestation of pregnancy: Secondary | ICD-10-CM | POA: Diagnosis not present

## 2023-02-05 DIAGNOSIS — R1030 Lower abdominal pain, unspecified: Secondary | ICD-10-CM | POA: Diagnosis not present

## 2023-02-05 DIAGNOSIS — R109 Unspecified abdominal pain: Secondary | ICD-10-CM

## 2023-02-05 DIAGNOSIS — O99891 Other specified diseases and conditions complicating pregnancy: Secondary | ICD-10-CM | POA: Diagnosis not present

## 2023-02-05 DIAGNOSIS — O26891 Other specified pregnancy related conditions, first trimester: Secondary | ICD-10-CM | POA: Insufficient documentation

## 2023-02-05 DIAGNOSIS — Z3490 Encounter for supervision of normal pregnancy, unspecified, unspecified trimester: Secondary | ICD-10-CM

## 2023-02-05 DIAGNOSIS — O2341 Unspecified infection of urinary tract in pregnancy, first trimester: Secondary | ICD-10-CM | POA: Diagnosis not present

## 2023-02-05 LAB — URINALYSIS, ROUTINE W REFLEX MICROSCOPIC
Bilirubin Urine: NEGATIVE
Glucose, UA: NEGATIVE mg/dL
Nitrite: NEGATIVE
Specific Gravity, Urine: 1.026 (ref 1.005–1.030)
pH: 5.5 (ref 5.0–8.0)

## 2023-02-05 LAB — CBC
HCT: 36.6 % (ref 36.0–46.0)
Hemoglobin: 11.8 g/dL — ABNORMAL LOW (ref 12.0–15.0)
MCH: 25.6 pg — ABNORMAL LOW (ref 26.0–34.0)
MCHC: 32.2 g/dL (ref 30.0–36.0)
MCV: 79.4 fL — ABNORMAL LOW (ref 80.0–100.0)
Platelets: 188 10*3/uL (ref 150–400)
RBC: 4.61 MIL/uL (ref 3.87–5.11)
RDW: 15.2 % (ref 11.5–15.5)
WBC: 9.9 10*3/uL (ref 4.0–10.5)
nRBC: 0 % (ref 0.0–0.2)

## 2023-02-05 LAB — BASIC METABOLIC PANEL
Anion gap: 9 (ref 5–15)
BUN: 10 mg/dL (ref 6–20)
CO2: 22 mmol/L (ref 22–32)
Calcium: 9.7 mg/dL (ref 8.9–10.3)
Chloride: 104 mmol/L (ref 98–111)
Creatinine, Ser: 0.78 mg/dL (ref 0.44–1.00)
GFR, Estimated: >60 mL/min (ref >60)
Glucose, Bld: 102 mg/dL — ABNORMAL HIGH (ref 70–99)
Potassium: 4 mmol/L (ref 3.5–5.1)
Sodium: 135 mmol/L (ref 135–145)

## 2023-02-05 LAB — HCG, QUANTITATIVE, PREGNANCY: hCG, Beta Chain, Quant, S: 13538 m[IU]/mL — ABNORMAL HIGH (ref <5)

## 2023-02-05 LAB — PREGNANCY, URINE: Preg Test, Ur: POSITIVE — AB

## 2023-02-05 MED ORDER — KETOROLAC TROMETHAMINE 30 MG/ML IJ SOLN
30.0000 mg | Freq: Once | INTRAMUSCULAR | Status: AC
  Administered 2023-02-05: 30 mg via INTRAVENOUS
  Filled 2023-02-05: qty 1

## 2023-02-05 MED ORDER — ONDANSETRON HCL 4 MG/2ML IJ SOLN
4.0000 mg | Freq: Once | INTRAMUSCULAR | Status: AC
  Administered 2023-02-05: 4 mg via INTRAVENOUS
  Filled 2023-02-05: qty 2

## 2023-02-05 MED ORDER — SODIUM CHLORIDE 0.9 % IV BOLUS
1000.0000 mL | Freq: Once | INTRAVENOUS | Status: AC
  Administered 2023-02-05: 1000 mL via INTRAVENOUS

## 2023-02-05 MED ORDER — NITROFURANTOIN MONOHYD MACRO 100 MG PO CAPS
100.0000 mg | ORAL_CAPSULE | Freq: Once | ORAL | Status: AC
  Administered 2023-02-05: 100 mg via ORAL
  Filled 2023-02-05: qty 1

## 2023-02-05 MED ORDER — NITROFURANTOIN MONOHYD MACRO 100 MG PO CAPS: 100.0000 mg | ORAL_CAPSULE | Freq: Two times a day (BID) | ORAL | 0 refills | Status: DC

## 2023-02-05 NOTE — Discharge Instructions (Signed)
Take Macrobid as prescribed until finished. Have your blood pregnancy test recheck in 48 hours. This can be coordinated by your primary care doctor or an OBGYN. If you are unable to have these providers order this test, return to the MAU at [facility] for completion of this test.

## 2023-02-05 NOTE — ED Provider Notes (Signed)
[place] EMERGENCY DEPARTMENT AT [facility] Provider Note   CSN: [id number] Arrival date & time: 02/05/23  1958     History  Chief Complaint  Patient presents with   Flank Pain    Jessica Hendrix is a 22 y.o. female.   Flank Pain     Patient has a history of constipation anxiety, depression, urinary tract infections.  Patient presents to the ED with complaints of dysuria and flank pain.  Patient states she started having symptoms a few days ago of dysuria.  She thought she was developing a urinary tract infection.  She did start develop some pain in her left flank area.  She noticed some blood in her urine today.  The pain she has is in her left flank and her suprapubic area.  Home Medications Prior to Admission medications   Medication Sig Start Date End Date Taking? Authorizing Provider  nitrofurantoin, macrocrystal-monohydrate, (MACROBID) 100 MG capsule Take 1 capsule (100 mg total) by mouth 2 (two) times daily. 02/05/23  Yes Linwood Dibbles, MD  albuterol (VENTOLIN HFA) 108 (90 Base) MCG/ACT inhaler Inhale 1 - 2 puffs into the lungs every 6 hours as needed for wheezing or shortness of breath. 01/13/23   Jeani Sow, MD  Azelastine-Fluticasone [phone] MCG/ACT SUSP Place 2 sprays into both nostrils daily. Patient not taking: Reported on 01/13/2023 09/07/22   Jeani Sow, MD  budesonide-formoterol [facility]) 80-4.5 MCG/ACT inhaler Inhale 2 puffs into the lungs 2 times daily. 01/13/23   Jeani Sow, MD      Allergies    Cinnamon, Penicillins, and Citrus    Review of Systems   Review of Systems  Genitourinary:  Positive for flank pain.    Physical Exam Updated Vital Signs BP 126/83 (BP Location: Right Arm)   Pulse 76   Temp 98.6 F (37 C) (Oral)   Resp 18   Ht 1.702 m (5\' 7" )   Wt 99.8 kg   LMP 12/29/2022 (Exact Date)   SpO2 100%   BMI 34.46 kg/m  Physical Exam Vitals and nursing note reviewed.  Constitutional:      General: She is not in  acute distress.    Appearance: She is well-developed.  HENT:     Head: Normocephalic and atraumatic.     Right Ear: External ear normal.     Left Ear: External ear normal.  Eyes:     General: No scleral icterus.       Right eye: No discharge.        Left eye: No discharge.     Conjunctiva/sclera: Conjunctivae normal.  Neck:     Trachea: No tracheal deviation.  Cardiovascular:     Rate and Rhythm: Normal rate and regular rhythm.  Pulmonary:     Effort: Pulmonary effort is normal. No respiratory distress.     Breath sounds: Normal breath sounds. No stridor. No wheezing or rales.  Abdominal:     General: Bowel sounds are normal. There is no distension.     Palpations: Abdomen is soft.     Tenderness: There is abdominal tenderness in the suprapubic area. There is left CVA tenderness. There is no guarding or rebound.  Musculoskeletal:        General: No tenderness or deformity.     Cervical back: Neck supple.  Skin:    General: Skin is warm and dry.     Findings: No rash.  Neurological:     General: No focal deficit present.  Mental Status: She is alert.     Cranial Nerves: No cranial nerve deficit, dysarthria or facial asymmetry.     Sensory: No sensory deficit.     Motor: No abnormal muscle tone or seizure activity.     Coordination: Coordination normal.  Psychiatric:        Mood and Affect: Mood normal.     ED Results / Procedures / Treatments   Labs (all labs ordered are listed, but only abnormal results are displayed) Labs Reviewed  URINALYSIS, ROUTINE W REFLEX MICROSCOPIC - Abnormal; Notable for the following components:      Result Value   Hgb urine dipstick SMALL (*)    Ketones, ur TRACE (*)    Protein, ur TRACE (*)    Leukocytes,Ua SMALL (*)    Bacteria, UA RARE (*)    All other components within normal limits  PREGNANCY, URINE - Abnormal; Notable for the following components:   Preg Test, Ur POSITIVE (*)    All other components within normal limits   BASIC METABOLIC PANEL - Abnormal; Notable for the following components:   Glucose, Bld 102 (*)    All other components within normal limits  CBC - Abnormal; Notable for the following components:   Hemoglobin 11.8 (*)    MCV 79.4 (*)    MCH 25.6 (*)    All other components within normal limits  HCG, QUANTITATIVE, PREGNANCY    EKG None  Radiology No results found.  Procedures Procedures    Medications Ordered in ED Medications  nitrofurantoin (macrocrystal-monohydrate) (MACROBID) capsule 100 mg (has no administration in time range)  sodium chloride 0.9 % bolus 1,000 mL (1,000 mLs Intravenous New Bag/Given 02/05/23 2102)  ketorolac (TORADOL) 30 MG/ML injection 30 mg (30 mg Intravenous Given 02/05/23 2122)  ondansetron (ZOFRAN) injection 4 mg (4 mg Intravenous Given 02/05/23 2122)    ED Course/ Medical Decision Making/ A&P Clinical Course as of 02/05/23 2227  Sun Feb 05, 2023  2148 CBC normal.  Metabolic panel normal.  Pregnancy test [JK]  2224 Urinalysis suggestive of urinary tract infection [JK]    Clinical Course User Index [JK] Linwood Dibbles, MD                             Medical Decision Making Problems Addressed: Flank pain: acute illness or injury Hematuria, unspecified type: acute illness or injury Pregnancy, unspecified gestational age: undiagnosed new problem with uncertain prognosis  Amount and/or Complexity of Data Reviewed Labs: ordered. Radiology: ordered.  Risk Prescription drug management.   Patient presented to the ED for evaluation of dysuria and hematuria.  Consider the possibility of ureteral colic or urinary tract infection.  Patient CBC and metabolic panel unremarkable.  Patient's not having any severe pain or discomfort.  Favor infection rather than renal colic.  Patient's urinalysis does suggest infection.  She has 21-50 white blood cells red blood cells and rare bacteria.  Patient's pregnancy test however is positive.  She denies any vaginal  bleeding but with her suprapubic and flank pain I think that is reasonable to an ultrasound to make sure she does not have evidence of ectopic pregnancy.  I have added on a quantitative hCG.  Ultrasound is not available at this facility until 4:30 in the morning which is 6 hours from now.  Will have the patient transferred to [facility], ED to have her ultrasound performed.  I discussed the case with Dr. Durwin Nora.  Will rx macrobid  for uti       Final Clinical Impression(s) / ED Diagnoses Final diagnoses:  Flank pain  Pregnancy, unspecified gestational age  Hematuria, unspecified type    Rx / DC Orders ED Discharge Orders          Ordered    nitrofurantoin, macrocrystal-monohydrate, (MACROBID) 100 MG capsule  2 times daily        02/05/23 2225              Linwood Dibbles, MD 02/05/23 2227

## 2023-02-05 NOTE — ED Notes (Signed)
Pt unable to provide urine sample at this time 

## 2023-02-05 NOTE — ED Triage Notes (Signed)
Pt via pov from home with left flank pain x 3 days. Pt has hx of kidney infection a couple of years ago. Pt also reports hematuria today. Pt alert & oriented, nad noted.

## 2023-02-06 ENCOUNTER — Other Ambulatory Visit (HOSPITAL_COMMUNITY): Payer: Self-pay

## 2023-02-06 ENCOUNTER — Emergency Department (HOSPITAL_COMMUNITY): Payer: 59

## 2023-02-06 DIAGNOSIS — O26891 Other specified pregnancy related conditions, first trimester: Secondary | ICD-10-CM | POA: Diagnosis not present

## 2023-02-06 DIAGNOSIS — Z3A01 Less than 8 weeks gestation of pregnancy: Secondary | ICD-10-CM | POA: Diagnosis not present

## 2023-02-06 DIAGNOSIS — O2341 Unspecified infection of urinary tract in pregnancy, first trimester: Secondary | ICD-10-CM | POA: Diagnosis not present

## 2023-02-06 MED ORDER — NITROFURANTOIN MONOHYD MACRO 100 MG PO CAPS
100.0000 mg | ORAL_CAPSULE | Freq: Two times a day (BID) | ORAL | 0 refills | Status: DC
  Filled 2023-02-06: qty 10, 5d supply, fill #0

## 2023-02-06 NOTE — ED Notes (Signed)
Discharge instructions discussed with pt. Verbalized understanding. VSS. No questions or concerns regarding discharge  

## 2023-02-06 NOTE — ED Provider Notes (Signed)
3:34 AM Patient care assumed in transfer for [place] to r/o ectoptic pregnancy. US shows probable intrauterine gestational sac and yolk sac. No fetal pole yet noted. No cardiac activity.  Discussed findings w/patient as well as return for repeat hCG in 48 hours to ensure appropriate progression. Will refer to OBGYN as patient is not currently followed by a obstetric/gynecologic practice. Encouraged to present to MAU for this if unable to be coordinated by her PCP. Return precautions discussed and provided. Patient discharged in stable condition with no unaddressed concerns.   US OB Comp < 14 Wks  Result Date: 02/06/2023 CLINICAL DATA:  UTI. EXAM: OBSTETRIC <14 WK ULTRASOUND TECHNIQUE: Transabdominal ultrasound was performed for evaluation of the gestation as well as the maternal uterus and adnexal regions. COMPARISON:  None Available. FINDINGS: Intrauterine gestational sac: Single Yolk sac:  Visualized. Embryo:  Not Visualized. Cardiac Activity: Not Visualized. Heart Rate: N/A bpm MSD:  10.5 mm   5 w   5 d Subchorionic hemorrhage:  None visualized. Maternal uterus/adnexae: The right ovary is visualized and is normal in appearance. A 3.0 cm simple left ovarian cyst is seen. No pelvic free fluid is noted. IMPRESSION: Probable early intrauterine gestational sac and yolk sac, but no fetal pole, or cardiac activity yet visualized. Recommend follow-up quantitative B-HCG levels and follow-up US in 14 days to assess viability. This recommendation follows SRU consensus guidelines: Diagnostic Criteria for Nonviable Pregnancy Early in the First Trimester. Malva Limes Med 2013; 161:0960-45. Electronically Signed   By: Aram Candela M.D.   On: 02/06/2023 00:31      Antony Madura, PA-C 02/06/23 [id number]    Sloan Leiter, DO 02/06/23 [phone]

## 2023-02-07 ENCOUNTER — Telehealth: Payer: Self-pay

## 2023-02-07 NOTE — Transitions of Care (Post Inpatient/ED Visit) (Signed)
   02/07/2023  Name: Jessica Hendrix MRN: [id number] DOB: 25-Sep-2001  Today's TOC FU Call Status: Today's TOC FU Call Status:: Unsuccessul Call (1st Attempt) Unsuccessful Call (1st Attempt) Date: 02/07/23  Attempted to reach the patient regarding the most recent Inpatient/ED visit.  Follow Up Plan: Additional outreach attempts will be made to reach the patient to complete the Transitions of Care (Post Inpatient/ED visit) call.   Signature Adela Glimpse, CMA

## 2023-02-08 ENCOUNTER — Encounter: Payer: Self-pay | Admitting: Family Medicine

## 2023-02-08 ENCOUNTER — Ambulatory Visit (INDEPENDENT_AMBULATORY_CARE_PROVIDER_SITE_OTHER): Payer: 59 | Admitting: Family Medicine

## 2023-02-08 ENCOUNTER — Other Ambulatory Visit (HOSPITAL_COMMUNITY): Payer: Self-pay

## 2023-02-08 VITALS — BP 120/70 | HR 85 | Temp 98.3°F | Resp 18 | Ht 67.0 in | Wt 214.2 lb

## 2023-02-08 DIAGNOSIS — Z3A01 Less than 8 weeks gestation of pregnancy: Secondary | ICD-10-CM

## 2023-02-08 DIAGNOSIS — R110 Nausea: Secondary | ICD-10-CM

## 2023-02-08 MED ORDER — ONDANSETRON HCL 4 MG PO TABS
4.0000 mg | ORAL_TABLET | Freq: Three times a day (TID) | ORAL | 3 refills | Status: DC | PRN
  Filled 2023-02-08: qty 30, 10d supply, fill #0

## 2023-02-08 NOTE — Progress Notes (Signed)
Subjective:     Patient ID: Jessica Hendrix, female    DOB: 08-23-2001, 22 y.o.   MRN: [id number]  Chief Complaint  Patient presents with   Referral    Referral to gyn, recently found out she is pregnant, need levels checked    HPI-here w/boyfriend  Last menstrual period 4/4.  In ER for abdomen pain-left flank. History of pyelo. Hematuria and dysuria.   +pregnancy.  Ectopic ruled out. also diagnosis urinary tract infection-on macrobid Ultrasound . US shows probable intrauterine gestational sac and yolk sac. No fetal pole yet noted. No cardiac activity. .  Needs repeat labs  Urinary tract infection better.  No pain.  A lot of nausea. No vag bleeding.   Nausea-smells and foods-patient is pregnant.     Health Maintenance Due  Topic Date Due   CHLAMYDIA SCREENING  Never done   HIV Screening  Never done   Hepatitis C Screening  Never done   DTaP/Tdap/Td (1 - Tdap) Never done    Past Medical History:  Diagnosis Date   Allergy    Anemia    Anxiety    Constipation    Depression    Stomach discomfort     History reviewed. No pertinent surgical history.   Current Outpatient Medications:    albuterol (VENTOLIN HFA) 108 (90 Base) MCG/ACT inhaler, Inhale 1 - 2 puffs into the lungs every 6 hours as needed for wheezing or shortness of breath., Disp: 6.7 g, Rfl: 1   nitrofurantoin, macrocrystal-monohydrate, (MACROBID) 100 MG capsule, Take 1 capsule (100 mg total) by mouth 2 (two) times daily., Disp: 10 capsule, Rfl: 0   ondansetron (ZOFRAN) 4 MG tablet, Take 1 tablet (4 mg total) by mouth every 8 (eight) hours as needed for nausea or vomiting., Disp: 30 tablet, Rfl: 3   Azelastine-Fluticasone 137-50 MCG/ACT SUSP, Place 2 sprays into both nostrils daily. (Patient not taking: Reported on 01/13/2023), Disp: 23 g, Rfl: 5   budesonide-formoterol (SYMBICORT) 80-4.5 MCG/ACT inhaler, Inhale 2 puffs into the lungs 2 times daily. (Patient not taking: Reported on 02/08/2023), Disp: 10.2 g, Rfl:  3  Allergies  Allergen Reactions   Cinnamon     spices   Penicillins Other (See Comments)    unk   Citrus Rash   ROS neg/noncontributory except as noted HPI/below      Objective:     BP 120/70   Pulse 85   Temp 98.3 F (36.8 C) (Temporal)   Resp 18   Ht 5\' 7"  (1.702 m)   Wt 214 lb 4 oz (97.2 kg)   LMP 12/29/2022 (Exact Date)   SpO2 99%   BMI 33.56 kg/m  Wt Readings from Last 3 Encounters:  02/08/23 214 lb 4 oz (97.2 kg)  02/05/23 220 lb (99.8 kg)  01/13/23 217 lb 6.4 oz (98.6 kg)    Physical Exam   Gen: WDWN NAD HEENT: NCAT, conjunctiva not injected, sclera nonicteric NECK:  supple, no thyromegaly, no nodes, no carotid bruits CARDIAC: RRR, S1S2+, no murmur. DP 2+B LUNGS: CTAB. No wheezes ABDOMEN:  BS+, soft, NTND, No HSM, no masses EXT:  no edema MSK: no gross abnormalities.  NEURO: A&O x3.  CN II-XII intact.  PSYCH: normal mood. Good eye contact  Reviewed ER records     Assessment & Plan:  Less than [redacted] weeks gestation of pregnancy -     Ambulatory referral to Gynecology -     hCG, quantitative, pregnancy  Nausea  Other orders -  Ondansetron HCl; Take 1 tablet (4 mg total) by mouth every 8 (eight) hours as needed for nausea or vomiting.  Dispense: 30 tablet; Refill: 3  1.  Pregnancy-advised to take prenatal vitamins.  If she has problems because of the citrus, but at least take folic acid, B complex, iron.  On ultrasound done in the emergency room, no fetus was seen nor cardiac movement, however yolk sac was present.  Will repeat beta hCG to make sure levels are increasing as expected.  Referral made to equal OB/GYN per patient preference 2.  Nausea-due to pregnancy.  Advised to eat crackers, small amounts, sips of water.  However, even the smell of food gets her sick.  Will do Zofran 4 mg every 8 as needed to allow for healthy eating/drinking  Angelena Sole, MD

## 2023-02-08 NOTE — Patient Instructions (Addendum)
Folic acid(folate)   Iron-325mg (elemental 65)  B complex  Referral placed to [facility]

## 2023-02-09 LAB — HCG, QUANTITATIVE, PREGNANCY: Quantitative HCG: 22738 m[IU]/mL

## 2023-02-13 ENCOUNTER — Telehealth: Payer: Self-pay | Admitting: Family Medicine

## 2023-02-13 NOTE — Telephone Encounter (Signed)
Patient states: - Had intense vomiting this morning due to pregnancy and couldn't go into work  - Informed by job that a note from doctor excusing her  Pt is requesting a work note. Please Advise.

## 2023-02-14 ENCOUNTER — Encounter: Payer: Self-pay | Admitting: Family Medicine

## 2023-02-14 ENCOUNTER — Encounter (HOSPITAL_BASED_OUTPATIENT_CLINIC_OR_DEPARTMENT_OTHER): Payer: Self-pay | Admitting: Emergency Medicine

## 2023-02-14 ENCOUNTER — Other Ambulatory Visit (HOSPITAL_BASED_OUTPATIENT_CLINIC_OR_DEPARTMENT_OTHER): Payer: Self-pay

## 2023-02-14 ENCOUNTER — Other Ambulatory Visit: Payer: Self-pay

## 2023-02-14 ENCOUNTER — Encounter: Payer: Self-pay | Admitting: *Deleted

## 2023-02-14 ENCOUNTER — Emergency Department (HOSPITAL_BASED_OUTPATIENT_CLINIC_OR_DEPARTMENT_OTHER)
Admission: EM | Admit: 2023-02-14 | Discharge: 2023-02-14 | Disposition: A | Discharge status: Home / Self Care | Payer: 59 | Attending: Emergency Medicine | Admitting: Emergency Medicine

## 2023-02-14 DIAGNOSIS — O219 Vomiting of pregnancy, unspecified: Secondary | ICD-10-CM | POA: Insufficient documentation

## 2023-02-14 DIAGNOSIS — R7981 Abnormal blood-gas level: Secondary | ICD-10-CM | POA: Insufficient documentation

## 2023-02-14 DIAGNOSIS — R1013 Epigastric pain: Secondary | ICD-10-CM | POA: Diagnosis not present

## 2023-02-14 DIAGNOSIS — O99011 Anemia complicating pregnancy, first trimester: Secondary | ICD-10-CM | POA: Diagnosis not present

## 2023-02-14 DIAGNOSIS — Z3A01 Less than 8 weeks gestation of pregnancy: Secondary | ICD-10-CM | POA: Diagnosis not present

## 2023-02-14 LAB — CBC WITH DIFFERENTIAL/PLATELET
Abs Immature Granulocytes: 0.03 10*3/uL (ref 0.00–0.07)
Basophils Absolute: 0 10*3/uL (ref 0.0–0.1)
Basophils Relative: 0 %
Eosinophils Absolute: 0 10*3/uL (ref 0.0–0.5)
Eosinophils Relative: 0 %
HCT: 37.1 % (ref 36.0–46.0)
Hemoglobin: 11.9 g/dL — ABNORMAL LOW (ref 12.0–15.0)
Immature Granulocytes: 0 %
Lymphocytes Relative: 16 %
Lymphs Abs: 1.6 10*3/uL (ref 0.7–4.0)
MCH: 25.7 pg — ABNORMAL LOW (ref 26.0–34.0)
MCHC: 32.1 g/dL (ref 30.0–36.0)
MCV: 80.1 fL (ref 80.0–100.0)
Monocytes Absolute: 0.7 10*3/uL (ref 0.1–1.0)
Monocytes Relative: 7 %
Neutro Abs: 7.4 10*3/uL (ref 1.7–7.7)
Neutrophils Relative %: 77 %
Platelets: 174 10*3/uL (ref 150–400)
RBC: 4.63 MIL/uL (ref 3.87–5.11)
RDW: 15.3 % (ref 11.5–15.5)
WBC: 9.9 10*3/uL (ref 4.0–10.5)
nRBC: 0 % (ref 0.0–0.2)

## 2023-02-14 LAB — URINALYSIS, W/ REFLEX TO CULTURE (INFECTION SUSPECTED)
Bacteria, UA: NONE SEEN
Bilirubin Urine: NEGATIVE
Glucose, UA: NEGATIVE mg/dL
Hgb urine dipstick: NEGATIVE
Ketones, ur: 15 mg/dL — AB
Leukocytes,Ua: NEGATIVE
Nitrite: NEGATIVE
Protein, ur: NEGATIVE mg/dL
Specific Gravity, Urine: 1.013 (ref 1.005–1.030)
pH: 6 (ref 5.0–8.0)

## 2023-02-14 LAB — COMPREHENSIVE METABOLIC PANEL
ALT: 40 U/L (ref 0–44)
AST: 18 U/L (ref 15–41)
Albumin: 4 g/dL (ref 3.5–5.0)
Alkaline Phosphatase: 58 U/L (ref 38–126)
Anion gap: 9 (ref 5–15)
BUN: 6 mg/dL (ref 6–20)
CO2: 21 mmol/L — ABNORMAL LOW (ref 22–32)
Calcium: 9.5 mg/dL (ref 8.9–10.3)
Chloride: 105 mmol/L (ref 98–111)
Creatinine, Ser: 0.81 mg/dL (ref 0.44–1.00)
GFR, Estimated: >60 mL/min (ref >60)
Glucose, Bld: 89 mg/dL (ref 70–99)
Potassium: 4.5 mmol/L (ref 3.5–5.1)
Sodium: 135 mmol/L (ref 135–145)
Total Bilirubin: 0.5 mg/dL (ref 0.3–1.2)
Total Protein: 7 g/dL (ref 6.5–8.1)

## 2023-02-14 MED ORDER — METOCLOPRAMIDE HCL 10 MG PO TABS
10.0000 mg | ORAL_TABLET | Freq: Four times a day (QID) | ORAL | 0 refills | Status: DC | PRN
  Filled 2023-02-14: qty 30, 8d supply, fill #0

## 2023-02-14 MED ORDER — DIPHENHYDRAMINE HCL 50 MG/ML IJ SOLN
25.0000 mg | Freq: Once | INTRAMUSCULAR | Status: AC
  Administered 2023-02-14: 25 mg via INTRAVENOUS
  Filled 2023-02-14: qty 1

## 2023-02-14 MED ORDER — LACTATED RINGERS IV BOLUS
1000.0000 mL | Freq: Once | INTRAVENOUS | Status: AC
  Administered 2023-02-14: 1000 mL via INTRAVENOUS

## 2023-02-14 MED ORDER — METOCLOPRAMIDE HCL 5 MG/ML IJ SOLN
10.0000 mg | Freq: Once | INTRAMUSCULAR | Status: AC
  Administered 2023-02-14: 10 mg via INTRAVENOUS
  Filled 2023-02-14: qty 2

## 2023-02-14 NOTE — Telephone Encounter (Signed)
Work note uploaded to [employer] for patient. Patient sent mychart message, per patient: I didn't go to work yesterday, and I'm not going today. I did take the nausea medicine but it doesn't really work well, and I don't want to over do it. I ended up going to the ER this morning (I am still here) because I'm in pain from throwing up so much and can't keep myself hydrated or keep food down.

## 2023-02-14 NOTE — Telephone Encounter (Signed)
Patient notified of message below.

## 2023-02-14 NOTE — Discharge Instructions (Addendum)
Recommend also taking over the counter unisom (doxylamine) to take nightly to help with nausea as well as vitamin B 6.

## 2023-02-14 NOTE — ED Triage Notes (Signed)
Pt arrives to ED with c/o emesis and nausea during pregnancy since yesterday. She notes she is around [redacted] weeks pregnant.

## 2023-02-14 NOTE — ED Notes (Signed)
Pt to restroom, steady gait

## 2023-02-14 NOTE — ED Notes (Signed)
Pt ambulatory with steady gait to restroom, will provide urine specimen 

## 2023-02-14 NOTE — ED Provider Notes (Signed)
[facility] Provider Note   CSN: [id number] Arrival date & time: 02/14/23  4098     History  Chief Complaint  Patient presents with   Emesis During Pregnancy    Jessica Hendrix is a 22 y.o. female.  HPI     22 year old G1 at 7 weeks presents with concern for nausea and vomiting.  Reports that she had some nausea that developed prior to discovering she was pregnant, but has had more significant nausea/vomiting for the last week.  Has had continuous nausea, reports she has been unable to keep anything down.  Reports that she is vomiting twice per day.  Denies constipation, diarrhea, dysuria, fever, significant abdominal pain, vaginal bleeding.  Reports that she does have some pain to the top of her abdomen after vomiting so much, feels like a soreness thinks it is related to the vomiting episode.  She was prescribed Zofran by her doctor, but has tried not to take it.  Reports that when she takes that she does not feel any better.  Today she tried to eat a croissant and she vomited that as well.  Past Medical History:  Diagnosis Date   Allergy    Anemia    Anxiety    Constipation    Depression    Stomach discomfort      Home Medications Prior to Admission medications   Medication Sig Start Date End Date Taking? Authorizing Provider  albuterol (VENTOLIN HFA) 108 (90 Base) MCG/ACT inhaler Inhale 1 - 2 puffs into the lungs every 6 hours as needed for wheezing or shortness of breath. 01/13/23   Jeani Sow, MD  Azelastine-Fluticasone [phone] MCG/ACT SUSP Place 2 sprays into both nostrils daily. Patient not taking: Reported on 01/13/2023 09/07/22   Jeani Sow, MD  budesonide-formoterol [facility]) 80-4.5 MCG/ACT inhaler Inhale 2 puffs into the lungs 2 times daily. Patient not taking: Reported on 02/08/2023 01/13/23   Jeani Sow, MD  nitrofurantoin, macrocrystal-monohydrate, (MACROBID) 100 MG capsule Take 1 capsule (100 mg  total) by mouth 2 (two) times daily. 02/06/23   Antony Madura, PA-C  ondansetron (ZOFRAN) 4 MG tablet Take 1 tablet (4 mg total) by mouth every 8 (eight) hours as needed for nausea or vomiting. 02/08/23   Jeani Sow, MD      Allergies    Cinnamon, Penicillins, and Citrus    Review of Systems   Review of Systems  Physical Exam Updated Vital Signs BP (!) 129/57 (BP Location: Right Arm)   Pulse 76   Temp 98.7 F (37.1 C) (Oral)   Resp 15   LMP 12/29/2022 (Exact Date)   SpO2 100%  Physical Exam Vitals and nursing note reviewed.  Constitutional:      General: She is not in acute distress.    Appearance: She is well-developed. She is not diaphoretic.  HENT:     Head: Normocephalic and atraumatic.  Eyes:     Conjunctiva/sclera: Conjunctivae normal.  Cardiovascular:     Rate and Rhythm: Normal rate and regular rhythm.     Heart sounds: Normal heart sounds. No murmur heard.    No friction rub. No gallop.  Pulmonary:     Effort: Pulmonary effort is normal. No respiratory distress.     Breath sounds: Normal breath sounds. No wheezing or rales.  Abdominal:     General: There is no distension.     Palpations: Abdomen is soft.     Tenderness: There is abdominal tenderness (mild  epigastric). There is no guarding.  Musculoskeletal:        General: No tenderness.     Cervical back: Normal range of motion.  Skin:    General: Skin is warm and dry.     Findings: No erythema or rash.  Neurological:     Mental Status: She is alert and oriented to person, place, and time.     ED Results / Procedures / Treatments   Labs (all labs ordered are listed, but only abnormal results are displayed) Labs Reviewed  CBC WITH DIFFERENTIAL/PLATELET - Abnormal; Notable for the following components:      Result Value   Hemoglobin 11.9 (*)    MCH 25.7 (*)    All other components within normal limits  COMPREHENSIVE METABOLIC PANEL - Abnormal; Notable for the following components:   CO2 21 (*)     All other components within normal limits  URINALYSIS, W/ REFLEX TO CULTURE (INFECTION SUSPECTED)    EKG None  Radiology No results found.  Procedures Procedures    Medications Ordered in ED Medications  lactated ringers bolus 1,000 mL (1,000 mLs Intravenous New Bag/Given 02/14/23 0914)  metoCLOPramide (REGLAN) injection 10 mg (10 mg Intravenous Given 02/14/23 0915)  diphenhydrAMINE (BENADRYL) injection 25 mg (25 mg Intravenous Given 02/14/23 0914)    ED Course/ Medical Decision Making/ A&P                                22 year old G1 at 7 weeks presents with concern for nausea and vomiting.   Abdominal exam benign, have low suspicion for appendicitis, cholecystitis, small bowel obstruction, diverticulitis, ovarian torsion.  She has a confirmed intrauterine pregnancy with a yolk and gestational sac on emergency department ultrasound previously//no signs of ectopic pregnancy.  Labs completed and personally about interpreted by me show hemoglobin of 11.9, no leukocytosis, normal transaminases, bicarb mildly decreased.  Urinalysis without infection.  Suspect vomiting secondary to pregnancy.  She feels improved after reglan/benadryl and fluids. Tolerating po gingerale.  Given prescription for Reglan.  Recommend Unisom and B6 as well. Patient discharged in stable condition with understanding of reasons to return.          Final Clinical Impression(s) / ED Diagnoses Final diagnoses:  Nausea and vomiting in pregnancy    Rx / DC Orders ED Discharge Orders     None         Alvira Monday, MD 02/14/23 [phone]

## 2023-02-14 NOTE — ED Notes (Signed)
Patient verbalizes understanding of discharge instructions. Opportunity for questioning and answers were provided. Patient discharged from ED.  °

## 2023-02-22 ENCOUNTER — Other Ambulatory Visit: Payer: Self-pay

## 2023-02-22 DIAGNOSIS — O3680X0 Pregnancy with inconclusive fetal viability, not applicable or unspecified: Secondary | ICD-10-CM

## 2023-02-23 ENCOUNTER — Other Ambulatory Visit: Payer: Self-pay | Admitting: Obstetrics & Gynecology

## 2023-02-23 ENCOUNTER — Ambulatory Visit (HOSPITAL_COMMUNITY)
Admission: RE | Admit: 2023-02-23 | Discharge: 2023-02-23 | Disposition: A | Discharge status: Home / Self Care | Payer: 59 | Source: Ambulatory Visit | Attending: Obstetrics & Gynecology | Admitting: Obstetrics & Gynecology

## 2023-02-23 DIAGNOSIS — Z3A01 Less than 8 weeks gestation of pregnancy: Secondary | ICD-10-CM | POA: Diagnosis not present

## 2023-02-23 DIAGNOSIS — O3680X0 Pregnancy with inconclusive fetal viability, not applicable or unspecified: Secondary | ICD-10-CM

## 2023-02-23 DIAGNOSIS — Z3689 Encounter for other specified antenatal screening: Secondary | ICD-10-CM | POA: Diagnosis not present

## 2023-02-28 ENCOUNTER — Emergency Department (HOSPITAL_BASED_OUTPATIENT_CLINIC_OR_DEPARTMENT_OTHER)
Admission: EM | Admit: 2023-02-28 | Discharge: 2023-03-01 | Disposition: A | Discharge status: Home / Self Care | Payer: 59 | Attending: Emergency Medicine | Admitting: Emergency Medicine

## 2023-02-28 ENCOUNTER — Other Ambulatory Visit: Payer: Self-pay

## 2023-02-28 ENCOUNTER — Encounter (HOSPITAL_BASED_OUTPATIENT_CLINIC_OR_DEPARTMENT_OTHER): Payer: Self-pay

## 2023-02-28 DIAGNOSIS — Z3A08 8 weeks gestation of pregnancy: Secondary | ICD-10-CM | POA: Diagnosis not present

## 2023-02-28 DIAGNOSIS — O210 Mild hyperemesis gravidarum: Secondary | ICD-10-CM | POA: Diagnosis not present

## 2023-02-28 DIAGNOSIS — Z87891 Personal history of nicotine dependence: Secondary | ICD-10-CM | POA: Diagnosis not present

## 2023-02-28 DIAGNOSIS — Z3A01 Less than 8 weeks gestation of pregnancy: Secondary | ICD-10-CM | POA: Diagnosis not present

## 2023-02-28 DIAGNOSIS — O219 Vomiting of pregnancy, unspecified: Secondary | ICD-10-CM | POA: Diagnosis present

## 2023-02-28 LAB — URINALYSIS, ROUTINE W REFLEX MICROSCOPIC
Bilirubin Urine: NEGATIVE
Glucose, UA: NEGATIVE mg/dL
Hgb urine dipstick: NEGATIVE
Ketones, ur: 40 mg/dL — AB
Nitrite: NEGATIVE
Specific Gravity, Urine: 1.02 (ref 1.005–1.030)
pH: 6.5 (ref 5.0–8.0)

## 2023-02-28 LAB — CBC
HCT: 37.5 % (ref 36.0–46.0)
Hemoglobin: 12.1 g/dL (ref 12.0–15.0)
MCH: 25.4 pg — ABNORMAL LOW (ref 26.0–34.0)
MCHC: 32.3 g/dL (ref 30.0–36.0)
MCV: 78.8 fL — ABNORMAL LOW (ref 80.0–100.0)
Platelets: 151 10*3/uL (ref 150–400)
RBC: 4.76 MIL/uL (ref 3.87–5.11)
RDW: 15.7 % — ABNORMAL HIGH (ref 11.5–15.5)
WBC: 10.2 10*3/uL (ref 4.0–10.5)
nRBC: 0 % (ref 0.0–0.2)

## 2023-02-28 MED ORDER — LACTATED RINGERS IV BOLUS
1000.0000 mL | Freq: Once | INTRAVENOUS | Status: AC
  Administered 2023-02-28: 1000 mL via INTRAVENOUS

## 2023-02-28 MED ORDER — ONDANSETRON HCL 4 MG/2ML IJ SOLN
4.0000 mg | Freq: Once | INTRAMUSCULAR | Status: AC
  Administered 2023-02-28: 4 mg via INTRAVENOUS
  Filled 2023-02-28: qty 2

## 2023-02-28 NOTE — ED Provider Notes (Signed)
[facility] Emergency Department Provider Note MRN:  [id number]  Arrival date & time: 03/01/23     Chief Complaint   Emesis During Pregnancy   History of Present Illness   Jessica Hendrix is a 22 y.o. year-old female with no pertinent past medical history presenting to the ED with chief complaint of emesis.  Patient is [redacted] weeks pregnant, has been having a lot of nausea but has been getting worse over the past few days.  Has Zofran and Reglan at home but did not seem to be helping today.  7+ episodes of vomiting today.  No abdominal pain, no vaginal bleeding, no fever, no other complaints.  Review of Systems  A thorough review of systems was obtained and all systems are negative except as noted in the HPI and PMH.   Patient's Health History    Past Medical History:  Diagnosis Date   Allergy    Anemia    Anxiety    Constipation    Depression    Stomach discomfort     History reviewed. No pertinent surgical history.  Family History  Problem Relation Age of Onset   Diabetes Father    Asthma Mother    Allergic rhinitis Neg Hx    Angioedema Neg Hx    Atopy Neg Hx    Eczema Neg Hx    Immunodeficiency Neg Hx    Urticaria Neg Hx     Social History   Socioeconomic History   Marital status: Single    Spouse name: Not on file   Number of children: 0   Years of education: Not on file   Highest education level: 12th grade  Occupational History   Not on file  Tobacco Use   Smoking status: Former    Types: Cigarettes   Smokeless tobacco: Never  Vaping Use   Vaping Use: Former  Substance and Sexual Activity   Alcohol use: Not Currently   Drug use: Never   Sexual activity: Yes    Birth control/protection: Condom  Other Topics Concern   Not on file  Social History Narrative   Female partner   Programmer, multimedia [employer]   Social Determinants of Health   Financial Resource Strain: Medium Risk (01/13/2023)   Overall Financial Resource Strain  (CARDIA)    Difficulty of Paying Living Expenses: Somewhat hard  Food Insecurity: Food Insecurity Present (01/13/2023)   Hunger Vital Sign    Worried About Running Out of Food in the Last Year: Never true    Ran Out of Food in the Last Year: Sometimes true  Transportation Needs: Unmet Transportation Needs (01/13/2023)   PRAPARE - Transportation    Lack of Transportation (Medical): Yes    Lack of Transportation (Non-Medical): Yes  Physical Activity: Sufficiently Active (01/13/2023)   Exercise Vital Sign    Days of Exercise per Week: 7 days    Minutes of Exercise per Session: 50 min  Stress: Stress Concern Present (01/13/2023)   [employer] of Occupational Health - Occupational Stress Questionnaire    Feeling of Stress : To some extent  Social Connections: Unknown (01/13/2023)   Social Connection and Isolation Panel [NHANES]    Frequency of Communication with Friends and Family: More than three times a week    Frequency of Social Gatherings with Friends and Family: More than three times a week    Attends Religious Services: Never    Database administrator or Organizations: Yes    Attends Club or  Organization Meetings: More than 4 times per year    Marital Status: Patient declined  Intimate Partner Violence: Not on file     Physical Exam   Vitals:   03/01/23 0000 03/01/23 0100  BP: 123/70 112/61  Pulse: 67 64  Resp: 18 16  Temp:    SpO2: 99% 93%    CONSTITUTIONAL: Well-appearing, NAD NEURO/PSYCH:  Alert and oriented x 3, no focal deficits EYES:  eyes equal and reactive ENT/NECK:  no LAD, no JVD CARDIO: Regular rate, well-perfused, normal S1 and S2 PULM:  CTAB no wheezing or rhonchi GI/GU:  non-distended, non-tender MSK/SPINE:  No gross deformities, no edema SKIN:  no rash, atraumatic   *Additional and/or pertinent findings included in MDM below  Diagnostic and Interventional Summary    EKG Interpretation  Date/Time:    Ventricular Rate:    PR Interval:    QRS  Duration:   QT Interval:    QTC Calculation:   R Axis:     Text Interpretation:         Labs Reviewed  COMPREHENSIVE METABOLIC PANEL - Abnormal; Notable for the following components:      Result Value   Sodium 133 (*)    CO2 21 (*)    BUN 5 (*)    All other components within normal limits  CBC - Abnormal; Notable for the following components:   MCV 78.8 (*)    MCH 25.4 (*)    RDW 15.7 (*)    All other components within normal limits  URINALYSIS, ROUTINE W REFLEX MICROSCOPIC - Abnormal; Notable for the following components:   APPearance HAZY (*)    Ketones, ur 40 (*)    Protein, ur TRACE (*)    Leukocytes,Ua TRACE (*)    Bacteria, UA FEW (*)    All other components within normal limits  HCG, QUANTITATIVE, PREGNANCY - Abnormal; Notable for the following components:   hCG, Beta Chain, Quant, S 152,500 (*)    All other components within normal limits  LIPASE, BLOOD    No orders to display    Medications  lactated ringers bolus 1,000 mL (0 mLs Intravenous Stopped 03/01/23 0056)  ondansetron (ZOFRAN) injection 4 mg (4 mg Intravenous Given 02/28/23 2348)     Procedures  /  Critical Care Procedures  ED Course and Medical Decision Making  Initial Impression and Ddx Well-appearing with reassuring vital signs, abdomen soft and nontender with no rebound guarding or rigidity.  Seems consistent with hyperemesis gravidarum.  Has had a confirmed IUP by ultrasound about a week ago.  Providing fluids, Zofran, will reassess.  Past medical/surgical history that increases complexity of ED encounter: None  Interpretation of Diagnostics I personally reviewed the laboratory assessment and my interpretation is as follows: No significant blood count or electrolyte disturbance    Patient Reassessment and Ultimate Disposition/Management     Patient is feeling better, vital signs normal, tolerating p.o., appropriate for discharge.  Patient management required discussion with the following  services or consulting groups:  None  Complexity of Problems Addressed Acute illness or injury that poses threat of life of bodily function  Additional Data Reviewed and Analyzed Further history obtained from: Further history from spouse/family member  Additional Factors Impacting ED Encounter Risk Prescriptions  Elmer Sow. Pilar Plate, MD [facility] Emergency Medicine [facility] [email or web address]  Final Clinical Impressions(s) / ED Diagnoses     ICD-10-CM   1. Hyperemesis gravidarum  O21.0       ED Discharge Orders  Ordered    Doxylamine-Pyridoxine 10-10 MG TBEC  2 times daily PRN        03/01/23 0207             Discharge Instructions Discussed with and Provided to Patient:     Discharge Instructions      You were evaluated in the Emergency Department and after careful evaluation, we did not find any emergent condition requiring admission or further testing in the hospital.  Your exam/testing today is overall reassuring.  Continue hydration at home, use your home nausea medications as needed, follow-up with your OB/GYN.  Please return to the Emergency Department if you experience any worsening of your condition.   Thank you for allowing [place] to be a part of your care.       Sabas Sous, MD 03/01/23 [phone]

## 2023-02-28 NOTE — ED Triage Notes (Signed)
Patient here POV from Home.  Endorses N/V for approximately 12 Hours. [redacted] Weeks Pregnant. No Known Fevers. Some Loose Stools.   NAD Noted during Triage. A&Ox4. Gcs 15. Ambulatory.

## 2023-03-01 ENCOUNTER — Other Ambulatory Visit (HOSPITAL_COMMUNITY): Payer: Self-pay

## 2023-03-01 LAB — COMPREHENSIVE METABOLIC PANEL
ALT: 26 U/L (ref 0–44)
AST: 26 U/L (ref 15–41)
Albumin: 4.2 g/dL (ref 3.5–5.0)
Alkaline Phosphatase: 62 U/L (ref 38–126)
Anion gap: 10 (ref 5–15)
BUN: 5 mg/dL — ABNORMAL LOW (ref 6–20)
CO2: 21 mmol/L — ABNORMAL LOW (ref 22–32)
Calcium: 9.4 mg/dL (ref 8.9–10.3)
Chloride: 102 mmol/L (ref 98–111)
Creatinine, Ser: 0.75 mg/dL (ref 0.44–1.00)
GFR, Estimated: >60 mL/min (ref >60)
Glucose, Bld: 84 mg/dL (ref 70–99)
Potassium: 4.4 mmol/L (ref 3.5–5.1)
Sodium: 133 mmol/L — ABNORMAL LOW (ref 135–145)
Total Bilirubin: 0.5 mg/dL (ref 0.3–1.2)
Total Protein: 7.3 g/dL (ref 6.5–8.1)

## 2023-03-01 LAB — LIPASE, BLOOD: Lipase: 22 U/L (ref 11–51)

## 2023-03-01 LAB — HCG, QUANTITATIVE, PREGNANCY: hCG, Beta Chain, Quant, S: 152500 m[IU]/mL — ABNORMAL HIGH (ref <5)

## 2023-03-01 MED ORDER — DOXYLAMINE-PYRIDOXINE 10-10 MG PO TBEC
1.0000 | DELAYED_RELEASE_TABLET | Freq: Two times a day (BID) | ORAL | 0 refills | Status: DC | PRN
  Filled 2023-03-01: qty 60, 30d supply, fill #0

## 2023-03-01 NOTE — Discharge Instructions (Signed)
You were evaluated in the Emergency Department and after careful evaluation, we did not find any emergent condition requiring admission or further testing in the hospital.  Your exam/testing today is overall reassuring.  Continue hydration at home, use your home nausea medications as needed, follow-up with your OB/GYN.  Please return to the Emergency Department if you experience any worsening of your condition.   Thank you for allowing [place] to be a part of your care.

## 2023-03-02 ENCOUNTER — Other Ambulatory Visit (HOSPITAL_COMMUNITY): Payer: Self-pay

## 2023-03-04 ENCOUNTER — Other Ambulatory Visit (HOSPITAL_COMMUNITY): Payer: Self-pay | Admitting: Emergency Medicine

## 2023-03-06 ENCOUNTER — Encounter (HOSPITAL_BASED_OUTPATIENT_CLINIC_OR_DEPARTMENT_OTHER): Payer: Self-pay

## 2023-03-06 ENCOUNTER — Emergency Department (HOSPITAL_BASED_OUTPATIENT_CLINIC_OR_DEPARTMENT_OTHER)
Admission: EM | Admit: 2023-03-06 | Discharge: 2023-03-06 | Disposition: A | Discharge status: Home / Self Care | Payer: 59 | Attending: Emergency Medicine | Admitting: Emergency Medicine

## 2023-03-06 ENCOUNTER — Other Ambulatory Visit: Payer: Self-pay

## 2023-03-06 DIAGNOSIS — Z3A01 Less than 8 weeks gestation of pregnancy: Secondary | ICD-10-CM | POA: Diagnosis not present

## 2023-03-06 DIAGNOSIS — Z3A09 9 weeks gestation of pregnancy: Secondary | ICD-10-CM | POA: Insufficient documentation

## 2023-03-06 DIAGNOSIS — O99281 Endocrine, nutritional and metabolic diseases complicating pregnancy, first trimester: Secondary | ICD-10-CM | POA: Insufficient documentation

## 2023-03-06 DIAGNOSIS — E871 Hypo-osmolality and hyponatremia: Secondary | ICD-10-CM | POA: Insufficient documentation

## 2023-03-06 DIAGNOSIS — O210 Mild hyperemesis gravidarum: Secondary | ICD-10-CM | POA: Insufficient documentation

## 2023-03-06 LAB — CBC WITH DIFFERENTIAL/PLATELET
Abs Immature Granulocytes: 0.03 10*3/uL (ref 0.00–0.07)
Basophils Absolute: 0 10*3/uL (ref 0.0–0.1)
Basophils Relative: 0 %
Eosinophils Absolute: 0 10*3/uL (ref 0.0–0.5)
Eosinophils Relative: 0 %
HCT: 37.9 % (ref 36.0–46.0)
Hemoglobin: 12.3 g/dL (ref 12.0–15.0)
Immature Granulocytes: 0 %
Lymphocytes Relative: 14 %
Lymphs Abs: 1.5 10*3/uL (ref 0.7–4.0)
MCH: 25.6 pg — ABNORMAL LOW (ref 26.0–34.0)
MCHC: 32.5 g/dL (ref 30.0–36.0)
MCV: 78.8 fL — ABNORMAL LOW (ref 80.0–100.0)
Monocytes Absolute: 0.8 10*3/uL (ref 0.1–1.0)
Monocytes Relative: 8 %
Neutro Abs: 8 10*3/uL — ABNORMAL HIGH (ref 1.7–7.7)
Neutrophils Relative %: 78 %
Platelets: 175 10*3/uL (ref 150–400)
RBC: 4.81 MIL/uL (ref 3.87–5.11)
RDW: 15.9 % — ABNORMAL HIGH (ref 11.5–15.5)
WBC: 10.4 10*3/uL (ref 4.0–10.5)
nRBC: 0 % (ref 0.0–0.2)

## 2023-03-06 LAB — URINALYSIS, ROUTINE W REFLEX MICROSCOPIC
Bilirubin Urine: NEGATIVE
Glucose, UA: NEGATIVE mg/dL
Hgb urine dipstick: NEGATIVE
Ketones, ur: >80 mg/dL — AB
Leukocytes,Ua: NEGATIVE
Nitrite: NEGATIVE
Protein, ur: 30 mg/dL — AB
Specific Gravity, Urine: 1.03 (ref 1.005–1.030)
pH: 6 (ref 5.0–8.0)

## 2023-03-06 LAB — COMPREHENSIVE METABOLIC PANEL
ALT: 32 U/L (ref 0–44)
AST: 21 U/L (ref 15–41)
Albumin: 4.2 g/dL (ref 3.5–5.0)
Alkaline Phosphatase: 74 U/L (ref 38–126)
Anion gap: 9 (ref 5–15)
BUN: 7 mg/dL (ref 6–20)
CO2: 24 mmol/L (ref 22–32)
Calcium: 9.5 mg/dL (ref 8.9–10.3)
Chloride: 101 mmol/L (ref 98–111)
Creatinine, Ser: 0.74 mg/dL (ref 0.44–1.00)
GFR, Estimated: >60 mL/min (ref >60)
Glucose, Bld: 80 mg/dL (ref 70–99)
Potassium: 4.1 mmol/L (ref 3.5–5.1)
Sodium: 134 mmol/L — ABNORMAL LOW (ref 135–145)
Total Bilirubin: 0.8 mg/dL (ref 0.3–1.2)
Total Protein: 7.5 g/dL (ref 6.5–8.1)

## 2023-03-06 MED ORDER — DIPHENHYDRAMINE HCL 50 MG/ML IJ SOLN
25.0000 mg | Freq: Once | INTRAMUSCULAR | Status: AC
  Administered 2023-03-06: 25 mg via INTRAVENOUS
  Filled 2023-03-06: qty 1

## 2023-03-06 MED ORDER — PROCHLORPERAZINE EDISYLATE 10 MG/2ML IJ SOLN
10.0000 mg | Freq: Once | INTRAMUSCULAR | Status: AC
  Administered 2023-03-06: 10 mg via INTRAVENOUS
  Filled 2023-03-06: qty 2

## 2023-03-06 MED ORDER — SODIUM CHLORIDE 0.9 % IV BOLUS
1000.0000 mL | Freq: Once | INTRAVENOUS | Status: AC
  Administered 2023-03-06: 1000 mL via INTRAVENOUS

## 2023-03-06 NOTE — ED Notes (Signed)
Pt tolerating crackers and water, Ivin Booty PA made aware

## 2023-03-06 NOTE — ED Notes (Signed)
Pt called out stating IV was stinging. Fluids stopped after , Ivin Booty PA made aware

## 2023-03-06 NOTE — ED Notes (Signed)
Patient verbalizes understanding of discharge instructions. Opportunity for questioning and answers were provided. Patient discharged from ED.  °

## 2023-03-06 NOTE — Discharge Instructions (Signed)
Please read and follow all provided instructions.  Your diagnoses today include:  1. Hyperemesis gravidarum     Tests performed today include: Blood cell counts and platelets: Normal white and red blood cell counts Kidney and liver function tests: Normal kidney function test Urine test to look for infection: Shows signs of dehydration, no infections Vital signs. See below for your results today.   Medications prescribed:  For nausea you may try half of a 25mg  doxylamine (Unisom) tablet twice a day and 25mg  of Vitamin B6, also called pyroxidine for nausea and vomiting in pregnancy. These medications are both inexpensive when bought over-the-counter.   Take any prescribed medications only as directed.  Home care instructions:  Follow any educational materials contained in this packet. Continue using home Reglan and stick with a bland diet and maintain good hydration.  Follow-up instructions: Please follow-up with your primary care provider in the next 3 days for further evaluation of your symptoms.    Return instructions:  SEEK IMMEDIATE MEDICAL ATTENTION IF: The pain does not go away or becomes severe  A temperature above 101F develops  Repeated vomiting occurs (multiple episodes)  The pain becomes localized to portions of the abdomen. The right side could possibly be appendicitis. In an adult, the left lower portion of the abdomen could be colitis or diverticulitis.  Blood is being passed in stools or vomit (bright red or black tarry stools)  You develop chest pain, difficulty breathing, dizziness or fainting, or become confused, poorly responsive, or inconsolable (young children) If you have any other emergent concerns regarding your health  Additional Information: Abdominal (belly) pain can be caused by many things. Your caregiver performed an examination and possibly ordered blood/urine tests and imaging (CT scan, x-rays, ultrasound). Many cases can be observed and treated at  home after initial evaluation in the emergency department. Even though you are being discharged home, abdominal pain can be unpredictable. Therefore, you need a repeated exam if your pain does not resolve, returns, or worsens. Most patients with abdominal pain don't have to be admitted to the hospital or have surgery, but serious problems like appendicitis and gallbladder attacks can start out as nonspecific pain. Many abdominal conditions cannot be diagnosed in one visit, so follow-up evaluations are very important.  Your vital signs today were: BP 130/80 (BP Location: Right Arm)   Pulse 81   Temp 99.1 F (37.3 C) (Oral)   Resp 18   Ht 5\' 7"  (1.702 m)   Wt 90.7 kg   LMP 12/29/2022 (Exact Date)   SpO2 100%   BMI 31.32 kg/m  If your blood pressure (bp) was elevated above 135/85 this visit, please have this repeated by your doctor within one month. --------------

## 2023-03-06 NOTE — ED Provider Notes (Signed)
[place] EMERGENCY DEPARTMENT AT [facility] Provider Note   CSN: [id number] Arrival date & time: 03/06/23  1349     History  Chief Complaint  Patient presents with   Emesis During Pregnancy    Jessica Hendrix is a 22 y.o. female.  Patient was currently pregnant (9 weeks and 1 day per most recent ultrasound confirming IUP) --presents with ongoing nausea and vomiting.  She has been dealing with this over the past couple of weeks.  She has been seen in the emergency department at least twice.  She has her first OB/GYN appointment in 1 week.  She had more frequent episodes of vomiting today, approximately 6 times, unable to keep down water.  This prompted emergency department visit.  She has some bilateral upper abdominal tenderness.  No history of abdominal surgeries.  She has not been pregnant in the past.  She denies vaginal bleeding or discharge.  She thinks that her urine has been a little dark but denies other UTI symptoms.  No fevers.  No chest pain or shortness of breath.  She has tried Reglan which helps sometimes but not at other times.  She was prescribed likely just but is unable to fill this due to cost.       Home Medications Prior to Admission medications   Medication Sig Start Date End Date Taking? Authorizing Provider  albuterol (VENTOLIN HFA) 108 (90 Base) MCG/ACT inhaler Inhale 1 - 2 puffs into the lungs every 6 hours as needed for wheezing or shortness of breath. 01/13/23   Jeani Sow, MD  Azelastine-Fluticasone [phone] MCG/ACT SUSP Place 2 sprays into both nostrils daily. Patient not taking: Reported on 01/13/2023 09/07/22   Jeani Sow, MD  budesonide-formoterol [facility]) 80-4.5 MCG/ACT inhaler Inhale 2 puffs into the lungs 2 times daily. Patient not taking: Reported on 02/08/2023 01/13/23   Jeani Sow, MD  Doxylamine-Pyridoxine 10-10 MG TBEC Take 1 tablet by mouth 2 times daily as needed. 03/01/23   Sabas Sous, MD  metoCLOPramide  (REGLAN) 10 MG tablet Take 1 tablet (10 mg total) by mouth every 6 (six) hours as needed for nausea or vomiting. 02/14/23   Alvira Monday, MD  nitrofurantoin, macrocrystal-monohydrate, (MACROBID) 100 MG capsule Take 1 capsule (100 mg total) by mouth 2 (two) times daily. 02/06/23   Antony Madura, PA-C  ondansetron (ZOFRAN) 4 MG tablet Take 1 tablet (4 mg total) by mouth every 8 (eight) hours as needed for nausea or vomiting. 02/08/23   Jeani Sow, MD      Allergies    Cinnamon, Penicillins, and Citrus    Review of Systems   Review of Systems  Physical Exam Updated Vital Signs BP 130/80 (BP Location: Right Arm)   Pulse 81   Temp 99.1 F (37.3 C) (Oral)   Resp 18   Ht 5\' 7"  (1.702 m)   Wt 90.7 kg   LMP 12/29/2022 (Exact Date)   SpO2 100%   BMI 31.32 kg/m  Physical Exam Vitals and nursing note reviewed.  Constitutional:      General: She is not in acute distress.    Appearance: She is well-developed.  HENT:     Head: Normocephalic and atraumatic.     Right Ear: External ear normal.     Left Ear: External ear normal.     Nose: Nose normal.  Eyes:     Conjunctiva/sclera: Conjunctivae normal.  Cardiovascular:     Rate and Rhythm: Normal rate and regular rhythm.  Heart sounds: No murmur heard. Pulmonary:     Effort: No respiratory distress.     Breath sounds: No wheezing, rhonchi or rales.  Abdominal:     Palpations: Abdomen is soft.     Tenderness: There is abdominal tenderness. There is no guarding or rebound.     Comments: Mild LUQ tenderness and periumbilical tenderness without rebound or guarding.   Musculoskeletal:     Cervical back: Normal range of motion and neck supple.     Right lower leg: No edema.     Left lower leg: No edema.  Skin:    General: Skin is warm and dry.     Findings: No rash.  Neurological:     General: No focal deficit present.     Mental Status: She is alert. Mental status is at baseline.     Motor: No weakness.  Psychiatric:         Mood and Affect: Mood normal.     ED Results / Procedures / Treatments   Labs (all labs ordered are listed, but only abnormal results are displayed) Labs Reviewed  CBC WITH DIFFERENTIAL/PLATELET - Abnormal; Notable for the following components:      Result Value   MCV 78.8 (*)    MCH 25.6 (*)    RDW 15.9 (*)    Neutro Abs 8.0 (*)    All other components within normal limits  COMPREHENSIVE METABOLIC PANEL - Abnormal; Notable for the following components:   Sodium 134 (*)    All other components within normal limits  URINALYSIS, ROUTINE W REFLEX MICROSCOPIC - Abnormal; Notable for the following components:   Ketones, ur >80 (*)    Protein, ur 30 (*)    Bacteria, UA RARE (*)    All other components within normal limits    EKG None  Radiology No results found.  Procedures Procedures    Medications Ordered in ED Medications  sodium chloride 0.9 % bolus 1,000 mL (0 mLs Intravenous Stopped 03/06/23 1551)  sodium chloride 0.9 % bolus 1,000 mL (0 mLs Intravenous Stopped 03/06/23 1500)  prochlorperazine (COMPAZINE) injection 10 mg (10 mg Intravenous Given 03/06/23 1427)  diphenhydrAMINE (BENADRYL) injection 25 mg (25 mg Intravenous Given 03/06/23 1427)    ED Course/ Medical Decision Making/ A&P    Patient seen and examined. History obtained directly from patient.  I reviewed previous ED visits and imaging results.  Labs/EKG: Ordered CBC, CMP, UA.  Imaging: None ordered.    Fluids: Ordered: IV Compazine, IV Benadryl as patient has concurrent left-sided headache, fluid bolus.   Most recent vital signs reviewed and are as follows: BP 130/80 (BP Location: Right Arm)   Pulse 81   Temp 99.1 F (37.3 C) (Oral)   Resp 18   Ht 5\' 7"  (1.702 m)   Wt 90.7 kg   LMP 12/29/2022 (Exact Date)   SpO2 100%   BMI 31.32 kg/m   Initial impression: Hyperemesis gravidarum, known IUP.  4:55 PM Reassessment performed. Patient appears improved.  Patient received approximately 500 cc of  the 2 L IV fluid that I had ordered.  The IV was hurting her arm and requested for it to be removed.  She did not want another IV placed.  Patient was given p.o. challenge and has passed.  She is having no difficulty tolerating liquids and crackers in the room.  She states that her headache is improved and that her nausea is improved as well.  Labs personally reviewed and interpreted including: CBC shows  normal total white blood cell count and normal hemoglobin; CMP with minimal hyponatremia at 134 otherwise normal electrolytes, glucose 80, creatinine 0.74 with a BUN of 7; UA no evidence of infection, patient does have small amount of protein and greater than 80 ketones likely related to poor oral intake.  Reviewed pertinent lab work and imaging with patient at bedside. Questions answered.   Most current vital signs reviewed and are as follows: BP 121/72   Pulse 80   Temp 99.1 F (37.3 C) (Oral)   Resp 18   Ht 5\' 7"  (1.702 m)   Wt 90.7 kg   LMP 12/29/2022 (Exact Date)   SpO2 99%   BMI 31.32 kg/m   Plan: Discharge to home.   Prescriptions written for: None, patient has PCP follow-up tomorrow  Other home care instructions discussed: I have given her instructions for use of over-the-counter doxylamine and B6 for nausea and pregnancy.  She also has previously prescribed Reglan at home.  ED return instructions discussed: Worsening abdominal pain, vaginal bleeding or discharge, persistent vomiting uncontrolled with home medications, lightheadedness or syncope, new symptoms or other concerns.  Follow-up instructions discussed: Patient encouraged to follow-up with their PCP as planned.                            Medical Decision Making Amount and/or Complexity of Data Reviewed Labs: ordered.  Risk Prescription drug management.   Patient with hyperemesis gravidarum, 2 previous ED visits.  Symptoms today are consistent with previous.  She has been having trouble tolerating fluids  today.  Lab work is very reassuring.  No focal pain on abdominal exam just mild tenderness in the left epigastrium without rebound or guarding.  She has no vaginal complaints today and I have no concerns about a impending miscarriage.  She was given IV hydration.  No hemoconcentration, AKI, significant abnormality of electrolytes on workup.  She will continue to hydrate well at home.  Discussed doxylamine and B6.  The patient's vital signs, pertinent lab work and imaging were reviewed and interpreted as discussed in the ED course. Hospitalization was considered for further testing, treatments, or serial exams/observation. However as patient is well-appearing, has a stable exam, and reassuring studies today, I do not feel that they warrant admission at this time. This plan was discussed with the patient who verbalizes agreement and comfort with this plan and seems reliable and able to return to the Emergency Department with worsening or changing symptoms.          Final Clinical Impression(s) / ED Diagnoses Final diagnoses:  Hyperemesis gravidarum    Rx / DC Orders ED Discharge Orders     None         Renne Crigler, PA-C 03/06/23 1659    Melene Plan, DO 03/07/23 [phone]

## 2023-03-06 NOTE — ED Triage Notes (Signed)
Patient here POV from Home  Endorses N/V since yesterday.   No Diarrhea. No Fevers. [redacted] Weeks Pregnant.   NAD Noted during Triage. A&Ox4. GCS 15. Ambulatory.

## 2023-03-07 ENCOUNTER — Encounter: Payer: Self-pay | Admitting: Family Medicine

## 2023-03-07 ENCOUNTER — Ambulatory Visit (INDEPENDENT_AMBULATORY_CARE_PROVIDER_SITE_OTHER): Payer: 59 | Admitting: Family Medicine

## 2023-03-07 VITALS — BP 115/77 | HR 76 | Temp 98.3°F | Resp 18 | Ht 67.0 in | Wt 205.2 lb

## 2023-03-07 DIAGNOSIS — Z3A09 9 weeks gestation of pregnancy: Secondary | ICD-10-CM

## 2023-03-07 DIAGNOSIS — O210 Mild hyperemesis gravidarum: Secondary | ICD-10-CM

## 2023-03-07 NOTE — Patient Instructions (Addendum)
Take the medicine vitamin  Sips frequently-different drinks. .   Ensure/boost if not eating well

## 2023-03-07 NOTE — Progress Notes (Signed)
Subjective:     Patient ID: Jessica Hendrix, female    DOB: 22-Mar-2001, 22 y.o.   MRN: [id number]  Chief Complaint  Patient presents with   Follow-up    ED follow-up from 03/06/23 for nausea and vomiting     HPI-here w/boyfriend  ER follow up for hyperemesis gravidim-got IVF.  Zofran not helping. Has been to Emergency Department 5/21, 6/4, 6/10.  Will be ok for few days after Emergency Department  and then restarts vomiting  Vomits daily all day long.   On 5/12-weight was 220# so losing weight(200-205 now)..  Diarrhea every few days-just 1-2x/that day. No abdomen pain.  No blood.  Urinating darker.  Tries not to take any medications. Did not fill doxylamine-pyridoxine as cost was $110 9wks 2 days of pregnancy Health Maintenance Due  Topic Date Due   CHLAMYDIA SCREENING  Never done   HIV Screening  Never done   Hepatitis C Screening  Never done   DTaP/Tdap/Td (1 - Tdap) Never done    Past Medical History:  Diagnosis Date   Allergy    Anemia    Anxiety    Constipation    Depression    Stomach discomfort     History reviewed. No pertinent surgical history.   Current Outpatient Medications:    albuterol (VENTOLIN HFA) 108 (90 Base) MCG/ACT inhaler, Inhale 1 - 2 puffs into the lungs every 6 hours as needed for wheezing or shortness of breath., Disp: 6.7 g, Rfl: 1   metoCLOPramide (REGLAN) 10 MG tablet, Take 1 tablet (10 mg total) by mouth every 6 (six) hours as needed for nausea or vomiting., Disp: 30 tablet, Rfl: 0   ondansetron (ZOFRAN) 4 MG tablet, Take 1 tablet (4 mg total) by mouth every 8 (eight) hours as needed for nausea or vomiting., Disp: 30 tablet, Rfl: 3   Azelastine-Fluticasone 137-50 MCG/ACT SUSP, Place 2 sprays into both nostrils daily. (Patient not taking: Reported on 01/13/2023), Disp: 23 g, Rfl: 5   budesonide-formoterol (SYMBICORT) 80-4.5 MCG/ACT inhaler, Inhale 2 puffs into the lungs 2 times daily. (Patient not taking: Reported on 02/08/2023), Disp: 10.2 g,  Rfl: 3   Doxylamine-Pyridoxine 10-10 MG TBEC, Take 1 tablet by mouth 2 times daily as needed. (Patient not taking: Reported on 03/07/2023), Disp: 60 tablet, Rfl: 0  Allergies  Allergen Reactions   Cinnamon     spices   Penicillins Other (See Comments)    unk   Citrus Rash   ROS neg/noncontributory except as noted HPI/below      Objective:     BP 115/77   Pulse 76   Temp 98.3 F (36.8 C) (Temporal)   Resp 18   Ht 5\' 7"  (1.702 m)   Wt 205 lb 4 oz (93.1 kg)   LMP 12/29/2022 (Exact Date)   SpO2 100%   BMI 32.15 kg/m  Wt Readings from Last 3 Encounters:  03/07/23 205 lb 4 oz (93.1 kg)  03/06/23 199 lb 15.3 oz (90.7 kg)  02/28/23 200 lb (90.7 kg)    Physical Exam   Gen: WDWN NAD HEENT: NCAT, conjunctiva not injected, sclera nonicteric NECK:  supple, no thyromegaly, no nodes, no carotid bruits CARDIAC: RRR, S1S2+, no murmur.  LUNGS: CTAB. No wheezes ABDOMEN:  BS+, soft, NTND, No HSM, no masses EXT:  no edema MSK: no gross abnormalities.  NEURO: A&O x3.  CN II-XII intact.  PSYCH: normal mood. Good eye contact  Reviewed ER notes     Assessment & Plan:  Hyperemesis gravidarum, mild, before 23rd week  Has been to ER 3x for IVF and Reglan(Zofran not working).  Sees obstetrics for intake next week(s), but not doctor till end of July-advised to make them aware.  Advised to try to get the doxylamine filled if can get money from family as may help.  Patient has lost 20# since she's been pregnant.  Advised sips frequently to try to stay hydrated.   Return if symptoms worsen or fail to improve.  Angelena Sole, MD

## 2023-03-10 ENCOUNTER — Other Ambulatory Visit (HOSPITAL_COMMUNITY): Payer: Self-pay

## 2023-03-13 ENCOUNTER — Ambulatory Visit (INDEPENDENT_AMBULATORY_CARE_PROVIDER_SITE_OTHER): Payer: 59 | Admitting: *Deleted

## 2023-03-13 ENCOUNTER — Other Ambulatory Visit (HOSPITAL_COMMUNITY): Payer: Self-pay

## 2023-03-13 VITALS — BP 108/73 | HR 80 | Wt 203.6 lb

## 2023-03-13 DIAGNOSIS — Z3480 Encounter for supervision of other normal pregnancy, unspecified trimester: Secondary | ICD-10-CM

## 2023-03-13 DIAGNOSIS — Z1339 Encounter for screening examination for other mental health and behavioral disorders: Secondary | ICD-10-CM

## 2023-03-13 DIAGNOSIS — O219 Vomiting of pregnancy, unspecified: Secondary | ICD-10-CM

## 2023-03-13 DIAGNOSIS — Z3A10 10 weeks gestation of pregnancy: Secondary | ICD-10-CM

## 2023-03-13 DIAGNOSIS — Z3401 Encounter for supervision of normal first pregnancy, first trimester: Secondary | ICD-10-CM

## 2023-03-13 HISTORY — DX: Encounter for supervision of other normal pregnancy, unspecified trimester: Z34.80

## 2023-03-13 MED ORDER — METOCLOPRAMIDE HCL 10 MG PO TABS
10.0000 mg | ORAL_TABLET | Freq: Four times a day (QID) | ORAL | 0 refills | Status: DC | PRN
Start: 2023-03-13 — End: 2023-05-11
  Filled 2023-03-13: qty 30, 8d supply, fill #0

## 2023-03-13 MED ORDER — BLOOD PRESSURE KIT DEVI
1.0000 | 0 refills | Status: DC
Start: 2023-03-13 — End: 2024-05-07

## 2023-03-13 MED ORDER — PROMETHAZINE HCL 25 MG PO TABS
25.0000 mg | ORAL_TABLET | Freq: Four times a day (QID) | ORAL | 1 refills | Status: DC | PRN
Start: 2023-03-13 — End: 2023-05-11
  Filled 2023-03-13: qty 30, 8d supply, fill #0

## 2023-03-13 MED ORDER — DOXYLAMINE-PYRIDOXINE 10-10 MG PO TBEC
2.0000 | DELAYED_RELEASE_TABLET | Freq: Every day | ORAL | 5 refills | Status: DC
Start: 2023-03-13 — End: 2023-03-14
  Filled 2023-03-13: qty 100, 30d supply, fill #0

## 2023-03-13 NOTE — Progress Notes (Signed)
Pt with severe nausea and vomiting. Unable to keep food or fluids down. Reports 20 lb weight loss in pregnancy. OK to complete intake. RX phenergan and diclegis sent, reglan renewed. Advised pt to seek care in MAU. Directions for MAU provided.

## 2023-03-13 NOTE — Progress Notes (Signed)
New OB Intake  I connected with Jessica Hendrix  on 03/13/23 at  2:10 PM EDT by In Person Visit and verified that I am speaking with the correct person using two identifiers. Nurse is located at [facility] and pt is located at [place].  I discussed the limitations, risks, security and privacy concerns of performing an evaluation and management service by telephone and the availability of in person appointments. I also discussed with the patient that there may be a patient responsible charge related to this service. The patient expressed understanding and agreed to proceed.  I explained I am completing New OB Intake today. We discussed EDD of 10/05/2023, by Last Menstrual Period. Pt is G1P0000. I reviewed her allergies, medications and Medical/Surgical/OB history.    Patient Active Problem List   Diagnosis Date Noted   Moderate persistent asthma without complication 01/13/2023   Secondary oligomenorrhea 09/21/2021   GAD (generalized anxiety disorder) 03/08/2020   Depression, major, single episode, moderate (HCC) 12/05/2019   Twitch 12/05/2019    Concerns addressed today  Delivery Plans Plans to deliver at [facility]. Discussed the nature of our practice with multiple providers including residents and students. Due to the size of the practice, the delivering provider may not be the same as those providing prenatal care.   Patient is interested in water birth. Offered upcoming OB visit with CNM to discuss further.  MyChart/Babyscripts MyChart access verified. I explained pt will have some visits in office and some virtually. Babyscripts instructions given and order placed. Patient verifies receipt of registration text/e-mail. Account successfully created and app downloaded.  Blood Pressure Cuff/Weight Scale Blood pressure cuff ordered for patient to pick-up from [employer]. Explained after first prenatal appt pt will check weekly and document in Babyscripts. Patient does not have weight  scale; patient may purchase if they desire to track weight weekly in Babyscripts.  Anatomy US Explained first scheduled [place] will be around 19 weeks. Anatomy US scheduled for TBD at MFM.  Is patient a CenteringPregnancy candidate?  Uncertain Declined due to  NA Not a candidate due to  NA If accepted,    Is patient a Mom+Baby Combined Care candidate?  Declined   If accepted, confirm patient does not intend to move from the area for at least 12 months, then notify Mom+Baby staff  Interested in [place]? If yes, send referral and doula dot phrase.    First visit review I reviewed new OB appt with patient. Explained pt will be seen by Dr.Forsyth at first visit. Discussed Avelina Laine genetic screening with patient. Needs Panor[employer] and [employer] at [employer].Marland Kitchen Routine prenatal labs are needed at [facility] OB visit   Last Pap No results found for: "DIAGPAP"  Harrel Lemon, RN 03/13/2023  2:01 PM

## 2023-03-14 ENCOUNTER — Other Ambulatory Visit (HOSPITAL_COMMUNITY): Payer: Self-pay

## 2023-03-14 ENCOUNTER — Inpatient Hospital Stay (HOSPITAL_COMMUNITY)
Admission: AD | Admit: 2023-03-14 | Discharge: 2023-03-14 | Disposition: A | Discharge status: Home / Self Care | Payer: 59 | Attending: Obstetrics and Gynecology | Admitting: Obstetrics and Gynecology

## 2023-03-14 ENCOUNTER — Other Ambulatory Visit: Payer: Self-pay

## 2023-03-14 ENCOUNTER — Encounter (HOSPITAL_COMMUNITY): Payer: Self-pay | Admitting: Obstetrics and Gynecology

## 2023-03-14 DIAGNOSIS — O219 Vomiting of pregnancy, unspecified: Secondary | ICD-10-CM | POA: Diagnosis not present

## 2023-03-14 DIAGNOSIS — Z3A10 10 weeks gestation of pregnancy: Secondary | ICD-10-CM

## 2023-03-14 LAB — URINALYSIS, ROUTINE W REFLEX MICROSCOPIC
Bilirubin Urine: NEGATIVE
Glucose, UA: NEGATIVE mg/dL
Hgb urine dipstick: NEGATIVE
Ketones, ur: 20 mg/dL — AB
Leukocytes,Ua: NEGATIVE
Nitrite: NEGATIVE
Protein, ur: NEGATIVE mg/dL
Specific Gravity, Urine: 1.023 (ref 1.005–1.030)
pH: 6 (ref 5.0–8.0)

## 2023-03-14 LAB — POCT PREGNANCY, URINE: Preg Test, Ur: POSITIVE — AB

## 2023-03-14 MED ORDER — SCOPOLAMINE 1 MG/3DAYS TD PT72
1.0000 | MEDICATED_PATCH | TRANSDERMAL | Status: DC
  Administered 2023-03-14: 1.5 mg via TRANSDERMAL
  Filled 2023-03-14: qty 1

## 2023-03-14 MED ORDER — SODIUM CHLORIDE 0.9 % IV SOLN
8.0000 mg | Freq: Once | INTRAVENOUS | Status: AC
  Administered 2023-03-14: 8 mg via INTRAVENOUS
  Filled 2023-03-14: qty 4

## 2023-03-14 MED ORDER — ONDANSETRON HCL 8 MG PO TABS
8.0000 mg | ORAL_TABLET | Freq: Three times a day (TID) | ORAL | 1 refills | Status: DC | PRN
  Filled 2023-03-14: qty 20, 7d supply, fill #0
  Filled 2023-04-22: qty 20, 7d supply, fill #1

## 2023-03-14 MED ORDER — LACTATED RINGERS IV BOLUS
1000.0000 mL | Freq: Once | INTRAVENOUS | Status: AC
  Administered 2023-03-14: 1000 mL via INTRAVENOUS

## 2023-03-14 MED ORDER — SCOPOLAMINE 1 MG/3DAYS TD PT72
1.0000 | MEDICATED_PATCH | TRANSDERMAL | 12 refills | Status: DC
  Filled 2023-03-14: qty 10, 30d supply, fill #0

## 2023-03-14 NOTE — MAU Provider Note (Signed)
History     CSN: [id number]  Arrival date and time: 03/14/23 1349   None     Chief Complaint  Patient presents with   Emesis   Nausea   HPI  Jessica Hendrix is a 22 y.o. female G1P0000 @ [redacted]w[redacted]d here in MAU with complaints of N/V. She reports Vomiting  x 5 times in the last 24 hours. She was prescribed nausea medications yesterday but has not been able to pick these up; she plans to . She feels week and dehydrated. She started care with [facility] this week.   No bleeding, no abdominla pain.   OB History     Gravida  1   Para  0   Term  0   Preterm  0   AB  0   Living  0      SAB  0   IAB  0   Ectopic  0   Multiple  0   Live Births  0           Past Medical History:  Diagnosis Date   Allergy    Anemia    Anxiety    Constipation    Depression    Stomach discomfort     History reviewed. No pertinent surgical history.  Family History  Problem Relation Age of Onset   Asthma Mother    Sleep apnea Mother    Obstructive Sleep Apnea Mother    Diabetes Father    Allergic rhinitis Neg Hx    Angioedema Neg Hx    Atopy Neg Hx    Eczema Neg Hx    Immunodeficiency Neg Hx    Urticaria Neg Hx     Social History   Tobacco Use   Smoking status: Never   Smokeless tobacco: Never  Vaping Use   Vaping Use: Never used  Substance Use Topics   Alcohol use: Not Currently   Drug use: Not Currently    Types: Marijuana    Allergies:  Allergies  Allergen Reactions   Cinnamon     spices   Penicillins Other (See Comments)    unk   Citrus Rash    Medications Prior to Admission  Medication Sig Dispense Refill Last Dose   Prenatal Vit-Fe Fumarate-FA (PRENATAL VITAMIN PO) Take 1 tablet by mouth daily.   03/14/2023   albuterol (VENTOLIN HFA) 108 (90 Base) MCG/ACT inhaler Inhale 1 - 2 puffs into the lungs every 6 hours as needed for wheezing or shortness of breath. 6.7 g 1    Azelastine-Fluticasone 137-50 MCG/ACT SUSP Place 2 sprays into both nostrils  daily. (Patient not taking: Reported on 01/13/2023) 23 g 5    Blood Pressure Monitoring (BLOOD PRESSURE KIT) DEVI 1 Device by Does not apply route once a week. 1 each 0    budesonide-formoterol (SYMBICORT) 80-4.5 MCG/ACT inhaler Inhale 2 puffs into the lungs 2 times daily. (Patient not taking: Reported on 02/08/2023) 10.2 g 3    Doxylamine-Pyridoxine (DICLEGIS) 10-10 MG TBEC Take 2 tablets by mouth at bedtime. If symptoms persist, add one tablet in the morning and one in the afternoon 100 tablet 5    Doxylamine-Pyridoxine 10-10 MG TBEC Take 1 tablet by mouth 2 times daily as needed. (Patient not taking: Reported on 03/07/2023) 60 tablet 0    metoCLOPramide (REGLAN) 10 MG tablet Take 1 tablet (10 mg) by mouth every 6 hours as needed for nausea or vomiting. 30 tablet 0    ondansetron (ZOFRAN) 4 MG tablet Take 1  tablet (4 mg total) by mouth every 8 (eight) hours as needed for nausea or vomiting. (Patient not taking: Reported on 03/13/2023) 30 tablet 3    promethazine (PHENERGAN) 25 MG tablet Take 1 tablet (25 mg) by mouth every 6 hours as needed for nausea or vomiting. 30 tablet 1    Results for orders placed or performed during the hospital encounter of 03/14/23 (from the past 48 hour(s))  Urinalysis, Routine w reflex microscopic -Urine, Clean Catch     Status: Abnormal   Collection Time: 03/14/23  1:54 PM  Result Value Ref Range   Color, Urine YELLOW YELLOW   APPearance CLEAR CLEAR   Specific Gravity, Urine 1.023 1.005 - 1.030   pH 6.0 5.0 - 8.0   Glucose, UA NEGATIVE NEGATIVE mg/dL   Hgb urine dipstick NEGATIVE NEGATIVE   Bilirubin Urine NEGATIVE NEGATIVE   Ketones, ur 20 (A) NEGATIVE mg/dL   Protein, ur NEGATIVE NEGATIVE mg/dL   Nitrite NEGATIVE NEGATIVE   Leukocytes,Ua NEGATIVE NEGATIVE    Comment: Performed at [facility], [address] [place], [place] [address]  Pregnancy, urine POC     Status: Abnormal   Collection Time: 03/14/23  2:11 PM  Result Value Ref Range   Preg Test, Ur  POSITIVE (A) NEGATIVE    Comment:        THE SENSITIVITY OF THIS METHODOLOGY IS >24 mIU/mL      Review of Systems  Gastrointestinal:  Positive for nausea and vomiting. Negative for abdominal pain.  Genitourinary:  Negative for vaginal bleeding.   Physical Exam   Blood pressure 116/61, pulse 87, temperature 98.2 F (36.8 C), temperature source Oral, resp. rate 18, height 5\' 7"  (1.702 m), weight 92.1 kg, last menstrual period 12/29/2022, SpO2 100 %.  Physical Exam Constitutional:      General: She is not in acute distress.    Appearance: Normal appearance. She is not ill-appearing, toxic-appearing or diaphoretic.  Eyes:     Pupils: Pupils are equal, round, and reactive to light.  Musculoskeletal:        General: Normal range of motion.  Neurological:     Mental Status: She is alert and oriented to person, place, and time.  Psychiatric:        Behavior: Behavior normal.    MAU Course  Procedures  MDM  Zofran 8 mg IV Scope patch applied LR bolus x 1 Urine shows 20 ketones.  Patient tolerating oral fluids and crackers.   Assessment and Plan   A:  1. Nausea and vomiting in pregnancy   2. [redacted] weeks gestation of pregnancy      P:  Discharge home  Small, frequent meals Bland diet Take your medications as prescribed.  Rx: Scope patch, Zofran  Venia Carbon I, NP 03/14/2023 9:06 PM

## 2023-03-14 NOTE — MAU Note (Signed)
Jessica Hendrix is a 22 y.o. at [redacted]w[redacted]d here in MAU reporting: she was instructed to be seen in MAU yesterday for hyperemesis but waited until today.  Reports N/V has worsened, vomited at least 8x in past 24 hours.  Reports unable to keep anything down.  Endorses upper abdominal that's intermittent and aching. Denies VB.   Prescribed new meds yesterday, hasn't picked up Rx.  Hasn't taken meds since last week. LMP: 7 Onset of complaint: yesterday Pain score: 7 Vitals:   03/14/23 1416  BP: 116/61  Pulse: 87  Resp: 18  Temp: 98.2 F (36.8 C)  SpO2: 100%     FHT:163 bpm Lab orders placed from triage:   UA

## 2023-03-15 ENCOUNTER — Other Ambulatory Visit (HOSPITAL_COMMUNITY): Payer: Self-pay

## 2023-03-16 ENCOUNTER — Other Ambulatory Visit (HOSPITAL_COMMUNITY): Payer: Self-pay

## 2023-03-20 ENCOUNTER — Other Ambulatory Visit (HOSPITAL_BASED_OUTPATIENT_CLINIC_OR_DEPARTMENT_OTHER): Payer: Self-pay

## 2023-03-20 ENCOUNTER — Encounter (HOSPITAL_BASED_OUTPATIENT_CLINIC_OR_DEPARTMENT_OTHER): Payer: Self-pay

## 2023-03-20 ENCOUNTER — Emergency Department (HOSPITAL_BASED_OUTPATIENT_CLINIC_OR_DEPARTMENT_OTHER)
Admission: EM | Admit: 2023-03-20 | Discharge: 2023-03-20 | Disposition: A | Discharge status: Home / Self Care | Payer: 59 | Attending: Emergency Medicine | Admitting: Emergency Medicine

## 2023-03-20 ENCOUNTER — Other Ambulatory Visit: Payer: Self-pay

## 2023-03-20 DIAGNOSIS — Z3A12 12 weeks gestation of pregnancy: Secondary | ICD-10-CM | POA: Insufficient documentation

## 2023-03-20 DIAGNOSIS — O26891 Other specified pregnancy related conditions, first trimester: Secondary | ICD-10-CM | POA: Insufficient documentation

## 2023-03-20 DIAGNOSIS — N898 Other specified noninflammatory disorders of vagina: Secondary | ICD-10-CM | POA: Diagnosis not present

## 2023-03-20 DIAGNOSIS — Z3A01 Less than 8 weeks gestation of pregnancy: Secondary | ICD-10-CM | POA: Diagnosis not present

## 2023-03-20 LAB — WET PREP, GENITAL
Clue Cells Wet Prep HPF POC: NONE SEEN
Sperm: NONE SEEN
Trich, Wet Prep: NONE SEEN
WBC, Wet Prep HPF POC: <10 (ref <10)
Yeast Wet Prep HPF POC: NONE SEEN

## 2023-03-20 NOTE — ED Provider Notes (Signed)
[place] EMERGENCY DEPARTMENT AT [facility] Provider Note   CSN: [id number] Arrival date & time: 03/20/23  1204     History  Chief Complaint  Patient presents with   Vaginal Discharge    Jessica Hendrix is a 22 y.o. female.   Vaginal Discharge Patient is a 22 year old female with a past medical history significant for anxiety, allergies, depression  She is present emergency room today with complaint of increased vaginal discharge she states it is clear she denies any itching odor or dyspareunia.  No vaginal bleeding.  She is [redacted] weeks pregnant she has had a ultrasound already showing intrauterine pregnancy.  She has no history of miscarriage or ectopic pregnancy.  She feels well denies any pain nausea vomiting or any other issues.  She has had some issues with nausea previously during her pregnancy but no nausea today.     Home Medications Prior to Admission medications   Medication Sig Start Date End Date Taking? Authorizing Provider  albuterol (VENTOLIN HFA) 108 (90 Base) MCG/ACT inhaler Inhale 1 - 2 puffs into the lungs every 6 hours as needed for wheezing or shortness of breath. 01/13/23   Jeani Sow, MD  Blood Pressure Monitoring (BLOOD PRESSURE KIT) DEVI 1 Device by Does not apply route once a week. 03/13/23   Constant, Peggy, MD  metoCLOPramide (REGLAN) 10 MG tablet Take 1 tablet (10 mg) by mouth every 6 hours as needed for nausea or vomiting. 03/13/23   Constant, Gigi Gin, MD  ondansetron (ZOFRAN) 8 MG tablet Take 1 tablet (8 mg) by mouth every 8 hours as needed for nausea or vomiting. 03/14/23   Rasch, Harolyn Rutherford, NP  Prenatal Vit-Fe Fumarate-FA (PRENATAL VITAMIN PO) Take 1 tablet by mouth daily.    [provider]  promethazine (PHENERGAN) 25 MG tablet Take 1 tablet (25 mg) by mouth every 6 hours as needed for nausea or vomiting. 03/13/23   Constant, Peggy, MD  scopolamine (TRANSDERM-SCOP) 1 MG/3DAYS Place 1 patch onto the skin as directed every 3 days.  03/17/23   Rasch, Harolyn Rutherford, NP      Allergies    Cinnamon, Penicillins, and Citrus    Review of Systems   Review of Systems  Genitourinary:  Positive for vaginal discharge.    Physical Exam Updated Vital Signs BP 109/65 (BP Location: Right Arm)   Pulse 78   Temp 98.5 F (36.9 C) (Oral)   Resp 20   Ht 5\' 7"  (1.702 m)   Wt 92.1 kg   LMP 12/29/2022 (Exact Date)   SpO2 99%   BMI 31.80 kg/m  Physical Exam Vitals and nursing note reviewed.  Constitutional:      General: She is not in acute distress.    Appearance: Normal appearance. She is not ill-appearing.  HENT:     Head: Normocephalic and atraumatic.     Mouth/Throat:     Mouth: Mucous membranes are moist.  Eyes:     General: No scleral icterus.       Right eye: No discharge.        Left eye: No discharge.     Conjunctiva/sclera: Conjunctivae normal.  Pulmonary:     Effort: Pulmonary effort is normal.     Breath sounds: No stridor.  Abdominal:     Tenderness: There is no abdominal tenderness. There is no guarding or rebound.  Genitourinary:    Comments: Gu exam deferred  Skin:    General: Skin is warm and dry.  Neurological:  Mental Status: She is alert and oriented to person, place, and time. Mental status is at baseline.     ED Results / Procedures / Treatments   Labs (all labs ordered are listed, but only abnormal results are displayed) Labs Reviewed  WET PREP, GENITAL  GC/CHLAMYDIA PROBE AMP ([place]) NOT AT [facility]    EKG None  Radiology No results found.  Procedures Procedures    Medications Ordered in ED Medications - No data to display  ED Course/ Medical Decision Making/ A&P Clinical Course as of 03/20/23 1450  Mon Mar 20, 2023  1415 OBGYN - office she isn't sure which  July 22nd - assigned OB  [WF]    Clinical Course User Index [WF] Gailen Shelter, [place]                             Medical Decision Making Amount and/or Complexity of Data Reviewed Labs:  ordered.   Patient is a 22 year old female with a past medical history significant for anxiety, allergies, depression  She is present emergency room today with complaint of increased vaginal discharge she states it is clear she denies any itching odor or dyspareunia.  No vaginal bleeding.  She is [redacted] weeks pregnant she has had a ultrasound already showing intrauterine pregnancy.  She has no history of miscarriage or ectopic pregnancy.  She feels well denies any pain nausea vomiting or any other issues.  She has had some issues with nausea previously during her pregnancy but no nausea today.  No abdominal tenderness.  GU exam deferred as she is having no pain.  No urinary symptoms to indicate need for UA.  Wet prep unremarkable.  Will discharge home with follow-up with patient's OB/GYN.  Patient given information for MAU if she has any other new or concerning symptoms.   Final Clinical Impression(s) / ED Diagnoses Final diagnoses:  Vaginal discharge    Rx / DC Orders ED Discharge Orders     None         Gailen Shelter, [place] 03/20/23 1450    Rolan Bucco, MD 03/20/23 1513

## 2023-03-20 NOTE — Discharge Instructions (Signed)
Your wet prep is normal.  I recommend following up with your OB/GYN at your neck scheduled appointment.  If you have any new or concerning symptoms I recommend going to the MAU which is located at [facility] [address] [place].

## 2023-03-20 NOTE — ED Triage Notes (Signed)
Patient here POV from Home.  Noted Clear Discharge today.   [redacted] Weeks Pregnant. No pain. No Dysuria. No N/V.   NAD Noted during Triage. A&Ox4. GCS 15. Ambulatory.

## 2023-03-21 LAB — GC/CHLAMYDIA PROBE AMP (~~LOC~~) NOT AT ARMC
Chlamydia: NEGATIVE
Comment: NEGATIVE
Comment: NORMAL
Neisseria Gonorrhea: NEGATIVE

## 2023-04-19 ENCOUNTER — Ambulatory Visit: Payer: 59 | Admitting: Obstetrics and Gynecology

## 2023-04-19 ENCOUNTER — Encounter: Payer: Self-pay | Admitting: Obstetrics and Gynecology

## 2023-04-19 ENCOUNTER — Ambulatory Visit (INDEPENDENT_AMBULATORY_CARE_PROVIDER_SITE_OTHER): Payer: 59 | Admitting: Licensed Clinical Social Worker

## 2023-04-19 VITALS — BP 109/63 | HR 74 | Wt 195.7 lb

## 2023-04-19 DIAGNOSIS — Z3402 Encounter for supervision of normal first pregnancy, second trimester: Secondary | ICD-10-CM

## 2023-04-19 DIAGNOSIS — Z3400 Encounter for supervision of normal first pregnancy, unspecified trimester: Secondary | ICD-10-CM

## 2023-04-19 DIAGNOSIS — Z3A16 16 weeks gestation of pregnancy: Secondary | ICD-10-CM

## 2023-04-19 DIAGNOSIS — F321 Major depressive disorder, single episode, moderate: Secondary | ICD-10-CM

## 2023-04-19 DIAGNOSIS — J4540 Moderate persistent asthma, uncomplicated: Secondary | ICD-10-CM | POA: Diagnosis not present

## 2023-04-19 DIAGNOSIS — Z3A15 15 weeks gestation of pregnancy: Secondary | ICD-10-CM | POA: Diagnosis not present

## 2023-04-19 DIAGNOSIS — Z3480 Encounter for supervision of other normal pregnancy, unspecified trimester: Secondary | ICD-10-CM | POA: Diagnosis not present

## 2023-04-19 DIAGNOSIS — Z6835 Body mass index (BMI) 35.0-35.9, adult: Secondary | ICD-10-CM

## 2023-04-19 DIAGNOSIS — Z3482 Encounter for supervision of other normal pregnancy, second trimester: Secondary | ICD-10-CM

## 2023-04-19 DIAGNOSIS — F411 Generalized anxiety disorder: Secondary | ICD-10-CM

## 2023-04-19 NOTE — Progress Notes (Signed)
NOB is in the office, intake completed on 03/13/23, u/s completed on 02/23/23. Reports not feeling fetal movement yet, denies pain

## 2023-04-19 NOTE — Progress Notes (Signed)
Subjective:   Jessica Hendrix is a 22 y.o. G1P0000 at [redacted]w[redacted]d by LMP c/w 7w [place] being seen today for her first obstetrical visit.  Her obstetrical history is significant for obesity and hyperemesis . Patient does intend to breast feed. Pregnancy history fully reviewed.  Patient reports no complaints. Doing better from n/v standpoint. Vomiting 1x per week at most, taking B6 w/ zofran prn. Is identifying different triggers for her n/v.  HISTORY: OB History  Gravida Para Term Preterm AB Living  1 0 0 0 0 0  SAB IAB Ectopic Multiple Live Births  0 0 0 0 0    # Outcome Date GA Lbr Len/2nd Weight Sex Type Anes PTL Lv  1 Current              Last pap smear: N/a age < 30  Past Medical History:  Diagnosis Date   Allergy    Anemia    Anxiety    Constipation    Depression    No past surgical history on file. Family History  Problem Relation Age of Onset   Asthma Mother    Sleep apnea Mother    Obstructive Sleep Apnea Mother    Diabetes Father    Allergic rhinitis Neg Hx    Angioedema Neg Hx    Atopy Neg Hx    Eczema Neg Hx    Immunodeficiency Neg Hx    Urticaria Neg Hx    Social History   Tobacco Use   Smoking status: Never   Smokeless tobacco: Never  Vaping Use   Vaping status: Never Used  Substance Use Topics   Alcohol use: Not Currently   Drug use: Not Currently    Types: Marijuana   Allergies  Allergen Reactions   Cinnamon     spices   Penicillins Other (See Comments)    unk   Citrus Rash   Current Outpatient Medications on File Prior to Visit  Medication Sig Dispense Refill   albuterol (VENTOLIN HFA) 108 (90 Base) MCG/ACT inhaler Inhale 1 - 2 puffs into the lungs every 6 hours as needed for wheezing or shortness of breath. 6.7 g 1   Prenatal Vit-Fe Fumarate-FA (PRENATAL VITAMIN PO) Take 1 tablet by mouth daily.     pyridOXINE (VITAMIN B6) 25 MG tablet Take 25 mg by mouth daily.     Blood Pressure Monitoring (BLOOD PRESSURE KIT) DEVI 1 Device by Does  not apply route once a week. (Patient not taking: Reported on 04/19/2023) 1 each 0   metoCLOPramide (REGLAN) 10 MG tablet Take 1 tablet (10 mg) by mouth every 6 hours as needed for nausea or vomiting. (Patient not taking: Reported on 04/19/2023) 30 tablet 0   ondansetron (ZOFRAN) 8 MG tablet Take 1 tablet (8 mg) by mouth every 8 hours as needed for nausea or vomiting. (Patient not taking: Reported on 04/19/2023) 20 tablet 1   promethazine (PHENERGAN) 25 MG tablet Take 1 tablet (25 mg) by mouth every 6 hours as needed for nausea or vomiting. (Patient not taking: Reported on 04/19/2023) 30 tablet 1   scopolamine (TRANSDERM-SCOP) 1 MG/3DAYS Place 1 patch onto the skin as directed every 3 days. (Patient not taking: Reported on 04/19/2023) 10 patch 12   No current facility-administered medications on file prior to visit.   Exam   Vitals:   04/19/23 1327  BP: 109/63  Pulse: 74  Weight: 195 lb 11.2 oz (88.8 kg)   Fetal Heart Rate (bpm): 143 General:  Alert, oriented and cooperative. Patient is in no acute distress.  Breast: Deferred  Cardiovascular: Normal heart rate noted  Respiratory: Normal respiratory effort, no problems with respiration noted    Assessment:   Pregnancy: G1P0000 Patient Active Problem List   Diagnosis Date Noted   Supervision of other normal pregnancy, antepartum 03/13/2023   Moderate persistent asthma without complication 01/13/2023   GAD (generalized anxiety disorder) 03/08/2020   Depression, major, single episode, moderate (HCC) 12/05/2019   Twitch 12/05/2019   Plan:  1. Supervision of other normal pregnancy, antepartum 2. [redacted] weeks gestation of pregnancy Initial labs drawn. Continue prenatal vitamins. Genetic Screening discussed: NIPS, carrier screening and AFP, ordered. Ultrasound discussed; fetal anatomic survey: scheduled 8/15. Problem list reviewed and updated. Discussed cone healthy baby website & prenatal classes The nature of [facility]  Faculty Practice with multiple MDs and other Advanced Practice Providers was explained to patient; also emphasized that residents, students are part of our team. Routine obstetric precautions reviewed.  3. BMI 35.0-35.9,adult Early A1c ordered  4. Moderate persistent asthma without complication No complaints, using albuterol rarely Discussed goal for albuterol < 2x/wk  5. GAD (generalized anxiety disorder) 6. Depression, major, single episode, moderate (HCC) No current meds, reports symptoms are currently controlled Discussed option for [facility] support, medications, etc.  Reviewed importance of monitoring symptoms throughout pregnancy  Return in about 4 weeks (around 05/17/2023) for return OB at 19-20 weeks with CNM.  Harvie Bridge, MD Obstetrician & Gynecologist, [facility] for [employer], [facility]

## 2023-04-20 LAB — CBC/D/PLT+RPR+RH+ABO+RUBIGG...
Antibody Screen: NEGATIVE
Basophils Absolute: 0 10*3/uL (ref 0.0–0.2)
Basos: 0 %
EOS (ABSOLUTE): 0 10*3/uL (ref 0.0–0.4)
Eos: 0 %
HCV Ab: NONREACTIVE
HIV Screen 4th Generation wRfx: NONREACTIVE
Hematocrit: 38.2 % (ref 34.0–46.6)
Hepatitis B Surface Ag: NEGATIVE
Immature Grans (Abs): 0.1 10*3/uL (ref 0.0–0.1)
Immature Granulocytes: 1 %
Lymphs: 19 %
MCH: 26.7 pg (ref 26.6–33.0)
MCHC: 32.2 g/dL (ref 31.5–35.7)
MCV: 83 fL (ref 79–97)
Monocytes Absolute: 0.8 10*3/uL (ref 0.1–0.9)
Neutrophils Absolute: 6.9 10*3/uL (ref 1.4–7.0)
Neutrophils: 72 %
Platelets: 162 10*3/uL (ref 150–450)
RBC: 4.6 x10E6/uL (ref 3.77–5.28)
RDW: 17.1 % — ABNORMAL HIGH (ref 11.7–15.4)
RPR Ser Ql: NONREACTIVE
Rh Factor: POSITIVE
Rubella Antibodies, IGG: 4.29 index (ref >0.99)
WBC: 9.7 10*3/uL (ref 3.4–10.8)

## 2023-04-20 LAB — HCV INTERPRETATION

## 2023-04-20 LAB — HEMOGLOBIN A1C
Est. average glucose Bld gHb Est-mCnc: 117 mg/dL
Hgb A1c MFr Bld: 5.7 % — ABNORMAL HIGH (ref 4.8–5.6)

## 2023-04-24 NOTE — BH Specialist Note (Signed)
Integrated Behavioral Health Initial In-Person Visit  MRN: [id number] Name: Jessica Hendrix  Number of Integrated Behavioral Health Clinician visits: 1 Session Start time: 130pm   Session End time: 145pm Total time in minutes: 15 mins in person at [facility]   Types of Service: General Behavioral Integrated Care (BHI)  Interpretor:No. Interpretor Name and Language: none   Warm Hand Off Completed.        Subjective: Jessica Hendrix is a 22 y.o. female accompanied by Father of child Patient was referred by Dr. Berton Lan for new ob introduction. Patient reports the following symptoms/concerns: no reported concerns  Duration of problem: n/a; Severity of problem: n/a  Objective: Mood: good  and Affect: Appropriate Risk of harm to self or others: No plan to harm self or others  Life Context: Family and Social: Lives with family  School/Work: employed  Self-Care: none Life Changes: new pregnancy  Patient and/or Family's Strengths/Protective Factors: Concrete supports in place (healthy food, safe environments, etc.)  Goals Addressed: Patient will: Take prenatal vitamins  Avoid and reduce stress   Communicate needs   Progress towards Goals: Ongoing  Interventions: Interventions utilized: Motivational Interviewing  Standardized Assessments completed: PHQ 9  Ms. Marina Goodell and FOB completed new ob intake and BH consult   Plan: Follow up with behavioral health clinician on : as needed  Behavioral recommendations: Prioritize rest, communicate needs and delegate tasks to prevent burnout  Referral(s): Integrated [employer] (In Clinic) "From scale of 1-10, how likely are you to follow plan?":    Gwyndolyn Saxon, LCSW

## 2023-04-25 ENCOUNTER — Other Ambulatory Visit (HOSPITAL_COMMUNITY): Payer: Self-pay

## 2023-05-05 ENCOUNTER — Ambulatory Visit: Payer: 59 | Admitting: Family Medicine

## 2023-05-08 DIAGNOSIS — O99210 Obesity complicating pregnancy, unspecified trimester: Secondary | ICD-10-CM | POA: Insufficient documentation

## 2023-05-11 ENCOUNTER — Other Ambulatory Visit: Payer: Self-pay | Admitting: *Deleted

## 2023-05-11 ENCOUNTER — Ambulatory Visit: Payer: 59 | Attending: Obstetrics and Gynecology

## 2023-05-11 ENCOUNTER — Ambulatory Visit: Payer: 59 | Admitting: *Deleted

## 2023-05-11 VITALS — BP 112/52 | HR 81

## 2023-05-11 DIAGNOSIS — J45909 Unspecified asthma, uncomplicated: Secondary | ICD-10-CM | POA: Insufficient documentation

## 2023-05-11 DIAGNOSIS — Z363 Encounter for antenatal screening for malformations: Secondary | ICD-10-CM | POA: Diagnosis not present

## 2023-05-11 DIAGNOSIS — O99212 Obesity complicating pregnancy, second trimester: Secondary | ICD-10-CM | POA: Diagnosis not present

## 2023-05-11 DIAGNOSIS — E669 Obesity, unspecified: Secondary | ICD-10-CM | POA: Diagnosis not present

## 2023-05-11 DIAGNOSIS — Z3480 Encounter for supervision of other normal pregnancy, unspecified trimester: Secondary | ICD-10-CM | POA: Diagnosis not present

## 2023-05-11 DIAGNOSIS — Z3492 Encounter for supervision of normal pregnancy, unspecified, second trimester: Secondary | ICD-10-CM

## 2023-05-11 DIAGNOSIS — O99512 Diseases of the respiratory system complicating pregnancy, second trimester: Secondary | ICD-10-CM | POA: Diagnosis not present

## 2023-05-11 DIAGNOSIS — O99892 Other specified diseases and conditions complicating childbirth: Secondary | ICD-10-CM

## 2023-05-11 DIAGNOSIS — Z3A19 19 weeks gestation of pregnancy: Secondary | ICD-10-CM | POA: Diagnosis not present

## 2023-05-11 DIAGNOSIS — O99210 Obesity complicating pregnancy, unspecified trimester: Secondary | ICD-10-CM | POA: Insufficient documentation

## 2023-05-12 ENCOUNTER — Ambulatory Visit (INDEPENDENT_AMBULATORY_CARE_PROVIDER_SITE_OTHER): Payer: 59 | Admitting: Family Medicine

## 2023-05-12 ENCOUNTER — Telehealth: Payer: Self-pay | Admitting: Family Medicine

## 2023-05-12 ENCOUNTER — Encounter: Payer: Self-pay | Admitting: Family Medicine

## 2023-05-12 DIAGNOSIS — Z0279 Encounter for issue of other medical certificate: Secondary | ICD-10-CM

## 2023-05-12 DIAGNOSIS — Z3A19 19 weeks gestation of pregnancy: Secondary | ICD-10-CM | POA: Diagnosis not present

## 2023-05-12 DIAGNOSIS — O210 Mild hyperemesis gravidarum: Secondary | ICD-10-CM

## 2023-05-12 NOTE — Telephone Encounter (Signed)
Patient dropped off document FMLA, to be filled out by provider. Patient requested to send it back via Call Patient to pick up within 5-days. Document is located in providers tray at front office.Please advise at Mobile [phone] (mobile)    Patient initially had an OV with PCP but couldn't stay due to schedule constraint. Per Dr. Ruthine Dose, patient will be picking up form on Monday. Form has been faxed to HIM fax queue by me, [place].

## 2023-05-12 NOTE — Progress Notes (Signed)
Virtual telephone visit    Virtual Visit via Telephone Note   This format is felt to be most appropriate for this patient at this time.  Physical exam was limited to content and character of the telephone converstion.  Pt was here in person for visit, but d/t conflict w/wait time and had another appt to get to, she had to leave.  I had to call her on phone to get details as to why this FMLA needed to be done.  Pt was driving so video was not an option  Patient location: + Patient and provider in visit Provider location: Office  I discussed the limitations of evaluation and management by telemedicine and the availability of in person appointments. The patient expressed understanding and agreed to proceed.   Visit Date: 05/12/2023  Today's healthcare provider: Angelena Sole, MD     Subjective:    Patient ID: Jessica Hendrix, female    DOB: December 11, 2000, 22 y.o.   MRN: [id number]  Chief Complaint  Patient presents with   forms    Needs fmla filled out    HPI Patient is in today for forms to be filled out. Pt has hyperemesis gravidarum.  Has been in and out of ER.  Early July, work told pt to go on medical LOA.  Pt having daily vomiting.  Last day worked was 1st wk in July.  Then work told her "medical leave denied" so pt missed another 1.5 wks of work.  Then she tried to go back to work and was told needs FMLA forms filled out or will lose job.  Pt has appt w/OB on 8/21.  Her employer told her tha primary care needed to fill out forms.  Had u/s yesterday and EDC adjusted to 10/01/23.  Pt will be 20wks on 8/18.    Past Medical History:  Diagnosis Date   Allergy    Anemia    Anxiety    Constipation    Depression    Depression, major, single episode, moderate (HCC) 12/05/2019   GAD (generalized anxiety disorder) 03/08/2020   Twitch 12/05/2019    Past Surgical History:  Procedure Laterality Date   WISDOM TOOTH EXTRACTION      Family History  Problem Relation Age of Onset    Asthma Mother    Sleep apnea Mother    Obstructive Sleep Apnea Mother    Diabetes Father    Allergic rhinitis Neg Hx    Angioedema Neg Hx    Atopy Neg Hx    Eczema Neg Hx    Immunodeficiency Neg Hx    Urticaria Neg Hx     Social History   Socioeconomic History   Marital status: Single    Spouse name: Not on file   Number of children: 0   Years of education: Not on file   Highest education level: 12th grade  Occupational History   Not on file  Tobacco Use   Smoking status: Never   Smokeless tobacco: Never  Vaping Use   Vaping status: Never Used  Substance and Sexual Activity   Alcohol use: Not Currently   Drug use: Not Currently    Types: Marijuana    Comment: last use feb 2024   Sexual activity: Yes    Birth control/protection: Condom  Other Topics Concern   Not on file  Social History Narrative   Female partner   Programmer, multimedia [employer]   Social Determinants of Health   Financial Resource Strain:  Medium Risk (01/13/2023)   Overall Financial Resource Strain (CARDIA)    Difficulty of Paying Living Expenses: Somewhat hard  Food Insecurity: Food Insecurity Present (01/13/2023)   Hunger Vital Sign    Worried About Running Out of Food in the Last Year: Never true    Ran Out of Food in the Last Year: Sometimes true  Transportation Needs: Unmet Transportation Needs (01/13/2023)   PRAPARE - Transportation    Lack of Transportation (Medical): Yes    Lack of Transportation (Non-Medical): Yes  Physical Activity: Sufficiently Active (01/13/2023)   Exercise Vital Sign    Days of Exercise per Week: 7 days    Minutes of Exercise per Session: 50 min  Stress: Stress Concern Present (01/13/2023)   [employer] of Occupational Health - Occupational Stress Questionnaire    Feeling of Stress : To some extent  Social Connections: Unknown (01/13/2023)   Social Connection and Isolation Panel [NHANES]    Frequency of Communication with Friends and Family: More than three times a  week    Frequency of Social Gatherings with Friends and Family: More than three times a week    Attends Religious Services: Never    Database administrator or Organizations: Yes    Attends Engineer, structural: More than 4 times per year    Marital Status: Patient declined  Catering manager Violence: Not on file    Outpatient Medications Prior to Visit  Medication Sig Dispense Refill   albuterol (VENTOLIN HFA) 108 (90 Base) MCG/ACT inhaler Inhale 1 - 2 puffs into the lungs every 6 hours as needed for wheezing or shortness of breath. 6.7 g 1   Blood Pressure Monitoring (BLOOD PRESSURE KIT) DEVI 1 Device by Does not apply route once a week. (Patient not taking: Reported on 04/19/2023) 1 each 0   ondansetron (ZOFRAN) 8 MG tablet Take 1 tablet (8 mg) by mouth every 8 hours as needed for nausea or vomiting. 20 tablet 1   Prenatal Vit-Fe Fumarate-FA (PRENATAL VITAMIN PO) Take 1 tablet by mouth daily.     pyridOXINE (VITAMIN B6) 25 MG tablet Take 25 mg by mouth daily.     No facility-administered medications prior to visit.    Allergies  Allergen Reactions   Cinnamon     spices   Penicillins Other (See Comments)    unk   Citrus Rash    ROS     Objective:      LMP 12/29/2022 (Exact Date)  Wt Readings from Last 3 Encounters:  04/19/23 195 lb 11.2 oz (88.8 kg)  03/20/23 203 lb 0.7 oz (92.1 kg)  03/14/23 203 lb 1.6 oz (92.1 kg)    Speaking in full sentences in NAD   Reviewed records including OB notes    Assessment & Plan:   Problem List Items Addressed This Visit   None Visit Diagnoses     Hyperemesis gravidarum, mild, before 23rd week    -  Primary     1  hyperemesis-pt needs FMLA forms filled out.  Ideally, OB should do this as related to pregnancy.  I will fill out until 8/21 but anything addn'l will need to come from them.  I am having Jessica Hendrix maintain her albuterol, Prenatal Vit-Fe Fumarate-FA (PRENATAL VITAMIN PO), Blood Pressure Kit, ondansetron,  and pyridOXINE.  No orders of the defined types were placed in this encounter.    I discussed the assessment and treatment plan with the patient. The patient was provided an opportunity to ask  questions and all were answered. The patient agreed with the plan and demonstrated an understanding of the instructions.   The patient was advised to call back or seek an in-person evaluation if the symptoms worsen or if the condition fails to improve as anticipated.  I provided 30 minutes of non-face-to-face time during this encounter. W/pt and filling out fmla  No follow-ups on file.  Angelena Sole, MD [facility] [phone] (phone) [phone] (fax)  [facility]

## 2023-05-12 NOTE — Patient Instructions (Signed)
It was very nice to see you today!  Forms will be ready for pick up on Monday.  Any further time off, will need to come from OB.   PLEASE NOTE:  If you had any lab tests please let us know if you have not heard back within a few days. You may see your results on MyChart before we have a chance to review them but we will give you a call once they are reviewed by [place]. If we ordered any referrals today, please let us know if you have not heard from their office within the next week.   Please try these tips to maintain a healthy lifestyle:  Eat most of your calories during the day when you are active. Eliminate processed foods including packaged sweets (pies, cakes, cookies), reduce intake of potatoes, white bread, white pasta, and white rice. Look for whole grain options, oat flour or almond flour.  Each meal should contain half fruits/vegetables, one quarter protein, and one quarter carbs (no bigger than a computer mouse).  Cut down on sweet beverages. This includes juice, soda, and sweet tea. Also watch fruit intake, though this is a healthier sweet option, it still contains natural sugar! Limit to 3 servings daily.  Drink at least 1 glass of water with each meal and aim for at least 8 glasses per day  Exercise at least 150 minutes every week.

## 2023-05-16 ENCOUNTER — Other Ambulatory Visit: Payer: 59

## 2023-05-17 ENCOUNTER — Telehealth (INDEPENDENT_AMBULATORY_CARE_PROVIDER_SITE_OTHER): Payer: 59 | Admitting: Obstetrics and Gynecology

## 2023-05-17 ENCOUNTER — Encounter: Payer: Self-pay | Admitting: Obstetrics and Gynecology

## 2023-05-17 ENCOUNTER — Other Ambulatory Visit (HOSPITAL_COMMUNITY): Payer: Self-pay

## 2023-05-17 DIAGNOSIS — Z3A19 19 weeks gestation of pregnancy: Secondary | ICD-10-CM

## 2023-05-17 DIAGNOSIS — Z3482 Encounter for supervision of other normal pregnancy, second trimester: Secondary | ICD-10-CM

## 2023-05-17 DIAGNOSIS — Z3480 Encounter for supervision of other normal pregnancy, unspecified trimester: Secondary | ICD-10-CM

## 2023-05-17 MED ORDER — FLUTICASONE PROPIONATE 50 MCG/ACT NA SUSP
1.0000 | Freq: Two times a day (BID) | NASAL | 0 refills | Status: DC
  Filled 2023-05-17: qty 16, 30d supply, fill #0

## 2023-05-17 NOTE — Patient Instructions (Signed)

## 2023-05-17 NOTE — Progress Notes (Unsigned)
  I connected with Jessica Hendrix 11/21/22 at  1:30 PM EST by: MyChart video and verified that I am speaking with the correct person using two identifiers.  Patient is located at home and provider is located at [facility].     I discussed the limitations, risks, security and privacy concerns of performing an evaluation and management service by MyChart video and the availability of in person appointments. I also discussed with the patient that there may be a patient responsible charge related to this service. By engaging in this virtual visit, you consent to the provision of healthcare.  Additionally, you authorize for your insurance to be billed for the services provided during this visit.  The patient expressed understanding and agreed to proceed.   The following staff members participated in the virtual visit:         PRENATAL VISIT NOTE  Subjective:  Jessica Hendrix is a 22 y.o. G1P0000 at [redacted]w[redacted]d being seen today for ongoing prenatal care.  She is currently monitored for the following issues for this low-risk pregnancy and has Moderate persistent asthma without complication; Supervision of other normal pregnancy, antepartum; and Obesity affecting pregnancy, antepartum on their problem list.  Patient reports having some nasal congestion and sinus congestion.  Contractions: Not present. Vag. Bleeding: None.  Movement: Present. Denies leaking of fluid.   The following portions of the patient's history were reviewed and updated as appropriate: allergies, current medications, past family history, past medical history, past social history, past surgical history and problem list.   Objective:  There were no vitals filed for this visit.  Fetal Status:     Movement: Present     General:  Alert, oriented and cooperative. Patient is in no acute distress.        Respiratory: Normal respiratory effort, no problems with respiration noted           Mental Status: Normal mood and affect. Normal  behavior. Normal judgment and thought content.   Assessment and Plan:  Pregnancy: G1P0000 at [redacted]w[redacted]d 1. Supervision of other normal pregnancy, antepartum BP and FHR normal  Feeling regular movement  Doing well   2. [redacted] weeks gestation of pregnancy Early gtt scheduled for Friday  U/S follow up 9/19 for completion of fetal anatomy  Discussed symptomatic tx of nasal congestion, rx sent to pharmacy  Preterm labor symptoms and general obstetric precautions including but not limited to vaginal bleeding, contractions, leaking of fluid and fetal movement were reviewed in detail with the patient. Please refer to After Visit Summary for other counseling recommendations.   Return in 4 weeks for routine prenatal   Future Appointments  Date Time Provider Department Center  05/19/2023  8:15 AM [facility] None  06/15/2023  3:30 PM [facility]     Albertine Grates, FNP

## 2023-05-19 ENCOUNTER — Other Ambulatory Visit: Payer: 59

## 2023-05-19 ENCOUNTER — Other Ambulatory Visit (HOSPITAL_COMMUNITY): Payer: Self-pay

## 2023-05-19 MED ORDER — ONDANSETRON HCL 8 MG PO TABS
8.0000 mg | ORAL_TABLET | Freq: Three times a day (TID) | ORAL | 0 refills | Status: DC | PRN
  Filled 2023-05-19: qty 20, 7d supply, fill #0

## 2023-05-19 NOTE — Progress Notes (Unsigned)
gtt

## 2023-05-19 NOTE — Progress Notes (Unsigned)
Pt is in the office for early gtt only. Pt unable to complete test due to N&V and states that anytime she goes long without eating she gets sick, asked can she have a rx for nausea and try at another time. S/w Dr. Berton Lan in office and advised refill for zofran will be sent.

## 2023-05-22 ENCOUNTER — Encounter: Payer: Self-pay | Admitting: Obstetrics and Gynecology

## 2023-05-24 ENCOUNTER — Other Ambulatory Visit (HOSPITAL_COMMUNITY): Payer: Self-pay

## 2023-05-25 ENCOUNTER — Emergency Department (HOSPITAL_BASED_OUTPATIENT_CLINIC_OR_DEPARTMENT_OTHER)
Admission: EM | Admit: 2023-05-25 | Discharge: 2023-05-25 | Disposition: A | Discharge status: Home / Self Care | Payer: 59 | Attending: Emergency Medicine | Admitting: Emergency Medicine

## 2023-05-25 ENCOUNTER — Telehealth: Payer: Self-pay

## 2023-05-25 ENCOUNTER — Emergency Department (HOSPITAL_BASED_OUTPATIENT_CLINIC_OR_DEPARTMENT_OTHER): Payer: 59 | Admitting: Radiology

## 2023-05-25 ENCOUNTER — Other Ambulatory Visit: Payer: Self-pay

## 2023-05-25 ENCOUNTER — Other Ambulatory Visit (HOSPITAL_COMMUNITY): Payer: Self-pay

## 2023-05-25 DIAGNOSIS — R140 Abdominal distension (gaseous): Secondary | ICD-10-CM | POA: Diagnosis not present

## 2023-05-25 DIAGNOSIS — O26892 Other specified pregnancy related conditions, second trimester: Secondary | ICD-10-CM | POA: Diagnosis not present

## 2023-05-25 DIAGNOSIS — R110 Nausea: Secondary | ICD-10-CM | POA: Diagnosis not present

## 2023-05-25 DIAGNOSIS — O99891 Other specified diseases and conditions complicating pregnancy: Secondary | ICD-10-CM | POA: Insufficient documentation

## 2023-05-25 DIAGNOSIS — R0789 Other chest pain: Secondary | ICD-10-CM | POA: Insufficient documentation

## 2023-05-25 DIAGNOSIS — Z3A21 21 weeks gestation of pregnancy: Secondary | ICD-10-CM | POA: Insufficient documentation

## 2023-05-25 DIAGNOSIS — R079 Chest pain, unspecified: Secondary | ICD-10-CM | POA: Diagnosis not present

## 2023-05-25 DIAGNOSIS — R0989 Other specified symptoms and signs involving the circulatory and respiratory systems: Secondary | ICD-10-CM | POA: Diagnosis not present

## 2023-05-25 LAB — CBC WITH DIFFERENTIAL/PLATELET
Abs Immature Granulocytes: 0.06 10*3/uL (ref 0.00–0.07)
Basophils Absolute: 0 10*3/uL (ref 0.0–0.1)
Basophils Relative: 0 %
Eosinophils Absolute: 0.1 10*3/uL (ref 0.0–0.5)
Eosinophils Relative: 1 %
HCT: 38.2 % (ref 36.0–46.0)
Hemoglobin: 12.3 g/dL (ref 12.0–15.0)
Immature Granulocytes: 1 %
Lymphocytes Relative: 20 %
Lymphs Abs: 2.2 10*3/uL (ref 0.7–4.0)
MCH: 26.8 pg (ref 26.0–34.0)
MCHC: 32.2 g/dL (ref 30.0–36.0)
MCV: 83.2 fL (ref 80.0–100.0)
Monocytes Absolute: 0.9 10*3/uL (ref 0.1–1.0)
Monocytes Relative: 8 %
Neutro Abs: 7.6 10*3/uL (ref 1.7–7.7)
Neutrophils Relative %: 70 %
Platelets: 144 10*3/uL — ABNORMAL LOW (ref 150–400)
RBC: 4.59 MIL/uL (ref 3.87–5.11)
RDW: 15.4 % (ref 11.5–15.5)
WBC: 10.9 10*3/uL — ABNORMAL HIGH (ref 4.0–10.5)
nRBC: 0 % (ref 0.0–0.2)

## 2023-05-25 LAB — BASIC METABOLIC PANEL
Anion gap: 9 (ref 5–15)
BUN: 11 mg/dL (ref 6–20)
CO2: 24 mmol/L (ref 22–32)
Calcium: 8.9 mg/dL (ref 8.9–10.3)
Chloride: 102 mmol/L (ref 98–111)
Creatinine, Ser: 0.79 mg/dL (ref 0.44–1.00)
GFR, Estimated: >60 mL/min (ref >60)
Glucose, Bld: 72 mg/dL (ref 70–99)
Potassium: 3.8 mmol/L (ref 3.5–5.1)
Sodium: 135 mmol/L (ref 135–145)

## 2023-05-25 LAB — TROPONIN I (HIGH SENSITIVITY): Troponin I (High Sensitivity): <2 ng/L (ref <18)

## 2023-05-25 NOTE — ED Provider Notes (Signed)
[place] [facility]  Provider Note  CSN: [id number] Arrival date & time: 05/25/23 9629  History Chief Complaint  Patient presents with   Chest Pain    Jessica Hendrix is a 22 y.o. female who is approx [redacted]wks pregnant reports she was in her usual state of health when she went to sleep last night. Woke up around 2am with L sided chest pain, radiating into her back. She initially thought it was the way she was sleeping, but continued for a few hours, associated with some nausea which is not unusual for her. She denies any SOB. No recent cough or fever. No pregnancy related concerns. Normal fetal movement. No vaginal bleeding/discharge. No recent travel or leg swelling.    Home Medications Prior to Admission medications   Medication Sig Start Date End Date Taking? Authorizing Provider  albuterol (VENTOLIN HFA) 108 (90 Base) MCG/ACT inhaler Inhale 1 - 2 puffs into the lungs every 6 hours as needed for wheezing or shortness of breath. 01/13/23   Jeani Sow, MD  Blood Pressure Monitoring (BLOOD PRESSURE KIT) DEVI 1 Device by Does not apply route once a week. Patient not taking: Reported on 04/19/2023 03/13/23   Constant, Peggy, MD  fluticasone (FLONASE) 50 MCG/ACT nasal spray Place 1 spray into both nostrils 2 (two) times daily. Place 1 spray into both nostrils twice daily 05/17/23   Sue Lush, FNP  ondansetron (ZOFRAN) 8 MG tablet Take 1 tablet (8 mg) by mouth every 8 hours as needed for nausea or vomiting. 05/19/23   Lennart Pall, MD  Prenatal Vit-Fe Fumarate-FA (PRENATAL VITAMIN PO) Take 1 tablet by mouth daily.    [provider]  pyridOXINE (VITAMIN B6) 25 MG tablet Take 25 mg by mouth daily.    [provider]     Allergies    Cinnamon, Penicillins, and Citrus   Review of Systems   Review of Systems Please see HPI for pertinent positives and negatives  Physical Exam BP 113/62   Pulse 68   Temp 98.1 F (36.7  C) (Oral)   Resp 12   LMP 12/29/2022 (Exact Date)   SpO2 100%   Physical Exam Vitals and nursing note reviewed.  Constitutional:      Appearance: Normal appearance.  HENT:     Head: Normocephalic and atraumatic.     Nose: Nose normal.     Mouth/Throat:     Mouth: Mucous membranes are moist.  Eyes:     Extraocular Movements: Extraocular movements intact.     Conjunctiva/sclera: Conjunctivae normal.  Cardiovascular:     Rate and Rhythm: Normal rate.  Pulmonary:     Effort: Pulmonary effort is normal.     Breath sounds: Normal breath sounds.  Chest:     Chest wall: Tenderness (left upper chest, reproduces symptoms) present.  Abdominal:     General: Abdomen is flat.     Palpations: Abdomen is soft.     Tenderness: There is no abdominal tenderness.  Musculoskeletal:        General: No swelling. Normal range of motion.     Cervical back: Neck supple.     Right lower leg: No edema.     Left lower leg: No edema.  Skin:    General: Skin is warm and dry.  Neurological:     General: No focal deficit present.     Mental Status: She is alert.  Psychiatric:        Mood and  Affect: Mood normal.     ED Results / Procedures / Treatments   EKG EKG Interpretation Date/Time:  Thursday May 25 2023 05:13:59 EDT Ventricular Rate:  72 PR Interval:  152 QRS Duration:  79 QT Interval:  394 QTC Calculation: 432 R Axis:   24  Text Interpretation: Sinus rhythm Normal ECG No old tracing to compare Confirmed by Susy Frizzle [phone]) on 05/25/2023 5:59:44 AM  Procedures Procedures  Medications Ordered in the ED Medications - No data to display  Initial Impression and Plan  Patient here with reproducible chest pain at 21wks of pregnancy. Vitals are reassuring, no signs of DVT. Low likelihood of clinically significant PE. Will check labs,CXR and reassess.   ED Course   Clinical Course as of 05/25/23 0641  Thu May 25, 2023  0600 CBC is unremarkable.  [CS]  0605 I personally  viewed the images from radiology studies and agree with radiologist interpretation: CXR is clear [CS]  0624 BMP is normal.  [CS]  0625 Trop is neg. Given duration of symptoms >3 hours and atypical nature, do not feel delta Trop is indicated.  [CS]  [phone] Patient remains well appearing, vitals are reassuring. Suspect costochondritis as a cause. Recommend she continue to take APAP for pain, may use a heating pad, rest and Ob follow up. RTED, preferably MAU, for any other concerns.  [CS]    Clinical Course User Index [CS] Pollyann Savoy, MD     MDM Rules/Calculators/A&P Medical Decision Making Given presenting complaint, I considered that admission might be necessary. After review of results from ED lab and/or imaging studies, admission to the hospital is not indicated at this time.    Problems Addressed: Chest wall pain: acute illness or injury  Amount and/or Complexity of Data Reviewed Labs: ordered. Decision-making details documented in ED Course. Radiology: ordered and independent interpretation performed. Decision-making details documented in ED Course. ECG/medicine tests: ordered and independent interpretation performed. Decision-making details documented in ED Course.  Risk OTC drugs. Decision regarding hospitalization.     Final Clinical Impression(s) / ED Diagnoses Final diagnoses:  Chest wall pain    Rx / DC Orders ED Discharge Orders     None        Pollyann Savoy, MD 05/25/23 [phone]

## 2023-05-25 NOTE — ED Triage Notes (Addendum)
Pt is [redacted]wks pregnant with c/o L chest pressure that woke her up at 3am. Pain radiates across chest and between shoulders. Denies any sob, +nausea. Feeling good kicks and movement of baby, healthy pregnancy to this point. No LOF or vaginal bleeding reported

## 2023-05-25 NOTE — Telephone Encounter (Signed)
S/w pt and she stated that she has been on leave from work since 03/27/23 related to hyperemesis. Advised patient that I consulted with Dr. Jolayne Panther in office today and missed days can be covered intermittently where she was seen at the hospital and/or in office since our providers did not advise her to stop working completely. Patient states that nausea and vomiting has improved but is not completely gone. Advised pt to address issues at appt with a provider so that the treatment plan can be adjusted if needed.

## 2023-05-26 ENCOUNTER — Other Ambulatory Visit: Payer: 59

## 2023-05-26 ENCOUNTER — Ambulatory Visit (INDEPENDENT_AMBULATORY_CARE_PROVIDER_SITE_OTHER): Payer: 59 | Admitting: Obstetrics & Gynecology

## 2023-05-26 VITALS — BP 108/68 | HR 77 | Wt 195.0 lb

## 2023-05-26 DIAGNOSIS — Z3480 Encounter for supervision of other normal pregnancy, unspecified trimester: Secondary | ICD-10-CM | POA: Diagnosis not present

## 2023-05-26 DIAGNOSIS — O99212 Obesity complicating pregnancy, second trimester: Secondary | ICD-10-CM | POA: Diagnosis not present

## 2023-05-26 DIAGNOSIS — Z3A21 21 weeks gestation of pregnancy: Secondary | ICD-10-CM

## 2023-05-26 DIAGNOSIS — O99810 Abnormal glucose complicating pregnancy: Secondary | ICD-10-CM

## 2023-05-26 DIAGNOSIS — O99210 Obesity complicating pregnancy, unspecified trimester: Secondary | ICD-10-CM

## 2023-05-26 NOTE — Progress Notes (Signed)
ROB, early GTT done today.

## 2023-05-26 NOTE — Progress Notes (Signed)
   PRENATAL VISIT NOTE  Subjective:  Jessica Hendrix is a 22 y.o. G1P0000 at [redacted]w[redacted]d being seen today for ongoing prenatal care.  She is currently monitored for the following issues for this high-risk pregnancy and has Moderate persistent asthma without complication; Supervision of other normal pregnancy, antepartum; and Obesity affecting pregnancy, antepartum on their problem list.  Patient reports no complaints.  Contractions: Not present. Vag. Bleeding: None.  Movement: Present. Denies leaking of fluid.   The following portions of the patient's history were reviewed and updated as appropriate: allergies, current medications, past family history, past medical history, past social history, past surgical history and problem list.   Objective:   Vitals:   05/26/23 0830  BP: 108/68  Pulse: 77  Weight: 195 lb (88.5 kg)    Fetal Status: Fetal Heart Rate (bpm): 148   Movement: Present     General:  Alert, oriented and cooperative. Patient is in no acute distress.  Skin: Skin is warm and dry. No rash noted.   Cardiovascular: Normal heart rate noted  Respiratory: Normal respiratory effort, no problems with respiration noted  Abdomen: Soft, gravid, appropriate for gestational age.  Pain/Pressure: Absent     Pelvic: Cervical exam deferred        Extremities: Normal range of motion.  Edema: None  Mental Status: Normal mood and affect. Normal behavior. Normal judgment and thought content.   Assessment and Plan:  Pregnancy: G1P0000 at [redacted]w[redacted]d 1. Supervision of other normal pregnancy, antepartum Hb A1c was prediabetic range, 2 hr GTT was ordered  2. Obesity affecting pregnancy, antepartum, unspecified obesity type Body mass index is 30.54 kg/m.   Preterm labor symptoms and general obstetric precautions including but not limited to vaginal bleeding, contractions, leaking of fluid and fetal movement were reviewed in detail with the patient. Please refer to After Visit Summary for other  counseling recommendations.   No follow-ups on file.  Future Appointments  Date Time Provider Department Center  05/26/2023  9:55 AM Adam Phenix, MD [facility] None  06/15/2023  3:30 PM [facility]    Scheryl Darter, MD

## 2023-05-27 LAB — GLUCOSE TOLERANCE, 2 HOURS W/ 1HR
Glucose, 1 hour: 111 mg/dL (ref 70–179)
Glucose, 2 hour: 93 mg/dL (ref 70–152)
Glucose, Fasting: 64 mg/dL — ABNORMAL LOW (ref 70–91)

## 2023-05-31 ENCOUNTER — Other Ambulatory Visit (HOSPITAL_COMMUNITY): Payer: Self-pay

## 2023-06-05 ENCOUNTER — Inpatient Hospital Stay (HOSPITAL_COMMUNITY)
Admission: AD | Admit: 2023-06-05 | Discharge: 2023-06-05 | Disposition: A | Discharge status: Home / Self Care | Payer: 59 | Source: Home / Self Care | Attending: Obstetrics and Gynecology | Admitting: Obstetrics and Gynecology

## 2023-06-05 ENCOUNTER — Encounter (HOSPITAL_COMMUNITY): Payer: Self-pay | Admitting: Obstetrics & Gynecology

## 2023-06-05 ENCOUNTER — Inpatient Hospital Stay (HOSPITAL_COMMUNITY)
Admission: AD | Admit: 2023-06-05 | Discharge: 2023-06-05 | Disposition: A | Discharge status: Home / Self Care | Payer: 59 | Attending: Obstetrics & Gynecology | Admitting: Obstetrics & Gynecology

## 2023-06-05 ENCOUNTER — Other Ambulatory Visit (HOSPITAL_COMMUNITY): Payer: Self-pay

## 2023-06-05 ENCOUNTER — Other Ambulatory Visit: Payer: Self-pay

## 2023-06-05 DIAGNOSIS — O99512 Diseases of the respiratory system complicating pregnancy, second trimester: Secondary | ICD-10-CM | POA: Insufficient documentation

## 2023-06-05 DIAGNOSIS — Z3A22 22 weeks gestation of pregnancy: Secondary | ICD-10-CM | POA: Insufficient documentation

## 2023-06-05 DIAGNOSIS — O98512 Other viral diseases complicating pregnancy, second trimester: Secondary | ICD-10-CM | POA: Insufficient documentation

## 2023-06-05 DIAGNOSIS — R000 Tachycardia, unspecified: Secondary | ICD-10-CM | POA: Insufficient documentation

## 2023-06-05 DIAGNOSIS — U071 COVID-19: Secondary | ICD-10-CM

## 2023-06-05 DIAGNOSIS — O99212 Obesity complicating pregnancy, second trimester: Secondary | ICD-10-CM | POA: Diagnosis not present

## 2023-06-05 DIAGNOSIS — Z91199 Patient's noncompliance with other medical treatment and regimen due to unspecified reason: Secondary | ICD-10-CM | POA: Insufficient documentation

## 2023-06-05 DIAGNOSIS — J45909 Unspecified asthma, uncomplicated: Secondary | ICD-10-CM | POA: Insufficient documentation

## 2023-06-05 DIAGNOSIS — J4540 Moderate persistent asthma, uncomplicated: Secondary | ICD-10-CM | POA: Insufficient documentation

## 2023-06-05 DIAGNOSIS — O26892 Other specified pregnancy related conditions, second trimester: Secondary | ICD-10-CM | POA: Insufficient documentation

## 2023-06-05 DIAGNOSIS — Z3A19 19 weeks gestation of pregnancy: Secondary | ICD-10-CM | POA: Insufficient documentation

## 2023-06-05 LAB — RESPIRATORY PANEL BY PCR

## 2023-06-05 LAB — URINALYSIS, ROUTINE W REFLEX MICROSCOPIC
Glucose, UA: NEGATIVE mg/dL
Hgb urine dipstick: NEGATIVE
Ketones, ur: 20 mg/dL — AB
Leukocytes,Ua: NEGATIVE
Nitrite: NEGATIVE
Protein, ur: 30 mg/dL — AB
Specific Gravity, Urine: 1.027 (ref 1.005–1.030)
pH: 5 (ref 5.0–8.0)

## 2023-06-05 LAB — SARS CORONAVIRUS 2 BY RT PCR: SARS Coronavirus 2 by RT PCR: POSITIVE — AB

## 2023-06-05 MED ORDER — BUDESONIDE-FORMOTEROL FUMARATE 80-4.5 MCG/ACT IN AERO: 2.0000 | INHALATION_SPRAY | Freq: Four times a day (QID) | RESPIRATORY_TRACT | 12 refills | Status: DC

## 2023-06-05 MED ORDER — ALBUTEROL SULFATE (2.5 MG/3ML) 0.083% IN NEBU
2.5000 mg | INHALATION_SOLUTION | RESPIRATORY_TRACT | Status: DC
  Administered 2023-06-05: 2.5 mg via RESPIRATORY_TRACT
  Filled 2023-06-05: qty 3

## 2023-06-05 MED ORDER — ONDANSETRON 4 MG PO TBDP
4.0000 mg | ORAL_TABLET | Freq: Once | ORAL | Status: DC
  Filled 2023-06-05: qty 1

## 2023-06-05 MED ORDER — LACTATED RINGERS IV BOLUS
1000.0000 mL | Freq: Once | INTRAVENOUS | Status: AC
  Administered 2023-06-05: 1000 mL via INTRAVENOUS

## 2023-06-05 MED ORDER — PHENOL 1.4 % MT LIQD
1.0000 | OROMUCOSAL | Status: DC | PRN
  Administered 2023-06-05: 1 via OROMUCOSAL
  Filled 2023-06-05: qty 177

## 2023-06-05 MED ORDER — BUDESONIDE-FORMOTEROL FUMARATE 80-4.5 MCG/ACT IN AERO: 2.0000 | INHALATION_SPRAY | Freq: Two times a day (BID) | RESPIRATORY_TRACT | 3 refills | Status: DC

## 2023-06-05 MED ORDER — LEVALBUTEROL TARTRATE 45 MCG/ACT IN AERO: 2.0000 | INHALATION_SPRAY | Freq: Four times a day (QID) | RESPIRATORY_TRACT | 1 refills | Status: DC | PRN

## 2023-06-05 MED ORDER — NIRMATRELVIR/RITONAVIR (PAXLOVID)TABLET: 3.0000 | ORAL_TABLET | Freq: Two times a day (BID) | ORAL | 0 refills | Status: AC

## 2023-06-05 MED ORDER — ALBUTEROL SULFATE HFA 108 (90 BASE) MCG/ACT IN AERS
1.0000 | INHALATION_SPRAY | Freq: Four times a day (QID) | RESPIRATORY_TRACT | 1 refills | Status: DC | PRN
  Filled 2023-06-05: qty 6.7, 25d supply, fill #0

## 2023-06-05 MED ORDER — LACTATED RINGERS IV BOLUS
1000.0000 mL | Freq: Once | INTRAVENOUS | Status: DC
Start: 2023-06-05 — End: 2023-06-05

## 2023-06-05 MED ORDER — LEVALBUTEROL HCL 0.63 MG/3ML IN NEBU: 0.6300 mg | INHALATION_SOLUTION | Freq: Once | RESPIRATORY_TRACT | 12 refills | Status: DC

## 2023-06-05 MED ORDER — SCOPOLAMINE 1 MG/3DAYS TD PT72
1.0000 | MEDICATED_PATCH | Freq: Once | TRANSDERMAL | Status: DC
  Administered 2023-06-05: 1.5 mg via TRANSDERMAL
  Filled 2023-06-05: qty 1

## 2023-06-05 MED ORDER — ALBUTEROL SULFATE (2.5 MG/3ML) 0.083% IN NEBU: 2.5000 mg | INHALATION_SOLUTION | Freq: Once | RESPIRATORY_TRACT | Status: DC

## 2023-06-05 MED ORDER — LACTATED RINGERS IV BOLUS: 2000.0000 mL | Freq: Once | INTRAVENOUS | Status: DC

## 2023-06-05 MED ORDER — BUDESONIDE-FORMOTEROL FUMARATE 80-4.5 MCG/ACT IN AERO
2.0000 | INHALATION_SPRAY | Freq: Two times a day (BID) | RESPIRATORY_TRACT | 12 refills | Status: DC
  Filled 2023-06-05: qty 10.2, 30d supply, fill #0

## 2023-06-05 MED ORDER — GUAIFENESIN ER 600 MG PO TB12
600.0000 mg | ORAL_TABLET | Freq: Two times a day (BID) | ORAL | Status: DC
  Filled 2023-06-05: qty 1

## 2023-06-05 MED ORDER — ACETAMINOPHEN 500 MG PO TABS
1000.0000 mg | ORAL_TABLET | Freq: Once | ORAL | Status: AC
  Administered 2023-06-05: 1000 mg via ORAL
  Filled 2023-06-05: qty 2

## 2023-06-05 NOTE — MAU Note (Signed)
.  Jessica Hendrix is a 22 y.o. at [redacted]w[redacted]d here in MAU reporting: chest is very heavy like someone is sitting on it, cough, N/V, body aches, heart is beating fast, feels like she is going to faint, and stuffy/runny nose. Denies being around anybody that has been sick lately. Denies VB or LOF. +FM. Took mucinex at 1400 yesterday but has not taken any medication since.   Onset of complaint: Friday  Pain score: 7 - chest; 8 - body aches Vitals:   06/05/23 0639  BP: 115/67  Pulse: (!) 120  Resp: 20  Temp: 99.2 F (37.3 C)  SpO2: 98%     FHT:162 Lab orders placed from triage:  UA

## 2023-06-05 NOTE — MAU Provider Note (Addendum)
Chief Complaint:  No chief complaint on file.  HPI  HPI: Jessica Hendrix is a 22 y.o. G1P0000 at 78w4dwho presents to maternity admissions reporting chest is very heavy like someone is sitting on it, cough, N/V, body aches, heart is beating fast, feels like she is going to faint, and stuffy/runny nose. Similar to previous asthma exacerbations, no longer has home inhalers. Denies sick contacts.   Took mucinex at 1400 yesterday but has not taken any medication since. .  She reports good fetal movement, denies LOF, vaginal bleeding, vaginal itching/burning, urinary symptoms, h/a, dizziness, n/v, diarrhea, constipation or fever/chills.  She denies headache, visual changes or RUQ abdominal pain.  She is currently monitored for the following issues for this high-risk pregnancy and has Moderate persistent asthma without complication; Supervision of other normal pregnancy, antepartum; and Obesity affecting pregnancy, antepartum on their problem list.   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   GAD (generalized anxiety disorder) 03/08/2020   Twitch 12/05/2019    Past obstetric history: OB History  Gravida Para Term Preterm AB Living  1 0 0 0 0 0  SAB IAB Ectopic Multiple Live Births  0 0 0 0 0    # Outcome Date GA Lbr Len/2nd Weight Sex Type Anes PTL Lv  1 Current             Past Surgical History: Past Surgical History:  Procedure Laterality Date   WISDOM TOOTH EXTRACTION      Family History: Family History  Problem Relation Age of Onset   Asthma Mother    Sleep apnea Mother    Obstructive Sleep Apnea Mother    Diabetes Father    Allergic rhinitis Neg Hx    Angioedema Neg Hx    Atopy Neg Hx    Eczema Neg Hx    Immunodeficiency Neg Hx    Urticaria Neg Hx     Social History: Social History   Tobacco Use   Smoking status:  Never   Smokeless tobacco: Never  Vaping Use   Vaping status: Never Used  Substance Use Topics   Alcohol use: Not Currently   Drug use: Not Currently    Types: Marijuana    Comment: last use feb 2024    Allergies:  Allergies  Allergen Reactions   Cinnamon     spices   Penicillins Other (See Comments)    unk   Citrus Rash    Meds:  Medications Prior to Admission  Medication Sig Dispense Refill Last Dose   Prenatal Vit-Fe Fumarate-FA (PRENATAL VITAMIN PO) Take 1 tablet by mouth daily.   06/04/2023   Blood Pressure Monitoring (BLOOD PRESSURE KIT) DEVI 1 Device by Does not apply route once a week. (Patient not taking: Reported on 04/19/2023) 1 each 0    fluticasone (FLONASE) 50 MCG/ACT nasal spray Place 1 spray into both nostrils 2 (two) times daily. Place 1 spray into both nostrils twice daily 16 g 0    ondansetron (ZOFRAN) 8 MG tablet Take 1 tablet (8 mg) by mouth every 8 hours as needed for nausea or vomiting. 20 tablet 0    pyridOXINE (VITAMIN B6) 25 MG tablet Take 25 mg by mouth daily.      [DISCONTINUED] albuterol (VENTOLIN HFA) 108 (90 Base) MCG/ACT inhaler Inhale 1 - 2 puffs into the  lungs every 6 hours as needed for wheezing or shortness of breath. 6.7 g 1     I have reviewed patient's Past Medical Hx, Surgical Hx, Family Hx, Social Hx, medications and allergies.   ROS:  Review of Systems Other systems negative  Physical Exam  Patient Vitals for the past 24 hrs:  BP Temp Pulse Resp SpO2 Height Weight  06/05/23 0859 -- -- -- -- 99 % -- --  06/05/23 0700 117/76 -- (!) 118 -- 98 % -- --  06/05/23 0639 115/67 99.2 F (37.3 C) (!) 120 20 98 % 5\' 7"  (1.702 m) 88.9 kg   Constitutional: Well-developed, well-nourished female in no acute distress. Afebrile, nontoxic appearing. Fatigued. Cardiovascular: normal rate and rhythm, no RMG Respiratory: normal effort, minimal air movement bilateral, no wheeze/rales/rhonchi, rare wet nonproductive cough GI: Abd soft, non-tender,  gravid appropriate for gestational age.   No rebound or guarding. MS: Extremities nontender, no edema, normal ROM Neurologic: Alert and oriented x 4.  GU: Neg CVAT.   FHT:  162   Labs: Results for orders placed or performed during the hospital encounter of 06/05/23 (from the past 24 hour(s))  Urinalysis, Routine w reflex microscopic -Urine, Clean Catch     Status: Abnormal   Collection Time: 06/05/23  6:54 AM  Result Value Ref Range   Color, Urine AMBER (A) YELLOW   APPearance HAZY (A) CLEAR   Specific Gravity, Urine 1.027 1.005 - 1.030   pH 5.0 5.0 - 8.0   Glucose, UA NEGATIVE NEGATIVE mg/dL   Hgb urine dipstick NEGATIVE NEGATIVE   Bilirubin Urine SMALL (A) NEGATIVE   Ketones, ur 20 (A) NEGATIVE mg/dL   Protein, ur 30 (A) NEGATIVE mg/dL   Nitrite NEGATIVE NEGATIVE   Leukocytes,Ua NEGATIVE NEGATIVE   RBC / HPF 0-5 0 - 5 RBC/hpf   WBC, UA 6-10 0 - 5 WBC/hpf   Bacteria, UA FEW (A) NONE SEEN   Squamous Epithelial / HPF 11-20 0 - 5 /HPF   Mucus PRESENT   SARS Coronavirus 2 by RT PCR (hospital order, performed in [facility] hospital lab) *cepheid single result test* Anterior Nasal Swab     Status: Abnormal   Collection Time: 06/05/23  7:02 AM   Specimen: Anterior Nasal Swab  Result Value Ref Range   SARS Coronavirus 2 by RT PCR POSITIVE (A) NEGATIVE  Respiratory (~20 pathogens) panel by PCR     Status: None   Collection Time: 06/05/23  7:02 AM   Specimen: Nasopharyngeal Swab; Respiratory  Result Value Ref Range   Adenovirus NOT DETECTED NOT DETECTED   Coronavirus 229E NOT DETECTED NOT DETECTED   Coronavirus HKU1 NOT DETECTED NOT DETECTED   Coronavirus NL63 NOT DETECTED NOT DETECTED   Coronavirus OC43 NOT DETECTED NOT DETECTED   Metapneumovirus NOT DETECTED NOT DETECTED   Rhinovirus / Enterovirus NOT DETECTED NOT DETECTED   Influenza A NOT DETECTED NOT DETECTED   Influenza B NOT DETECTED NOT DETECTED   Parainfluenza Virus 1 NOT DETECTED NOT DETECTED   Parainfluenza Virus 2  NOT DETECTED NOT DETECTED   Parainfluenza Virus 3 NOT DETECTED NOT DETECTED   Parainfluenza Virus 4 NOT DETECTED NOT DETECTED   Respiratory Syncytial Virus NOT DETECTED NOT DETECTED   Bordetella pertussis NOT DETECTED NOT DETECTED   Bordetella Parapertussis NOT DETECTED NOT DETECTED   Chlamydophila pneumoniae NOT DETECTED NOT DETECTED   Mycoplasma pneumoniae NOT DETECTED NOT DETECTED   O/Positive/-- (07/24 1351)  Imaging:  DG Chest Port 1 View  Result Date:  05/25/2023 CLINICAL DATA:  22 year old female is pregnant in the 2nd trimester with chest pain that woke her at 0300 hours. Nausea. EXAM: PORTABLE CHEST 1 VIEW COMPARISON:  Chest and abdomen radiographs 07/09/2021. FINDINGS: Portable AP view at 0536 hours. Mildly lower lung volumes. EKG leads and wires. Normal cardiac size and mediastinal contours. Visualized tracheal air column is within normal limits. Allowing for portable technique the lungs are clear. No pneumothorax. Paucity of bowel gas in the visible abdomen. No osseous abnormality identified. IMPRESSION: Negative portable chest. Electronically Signed   By: Odessa Fleming M.D.   On: 05/25/2023 06:01   [place] MFM OB DETAIL +14 WK  Result Date: 05/11/2023 ----------------------------------------------------------------------  OBSTETRICS REPORT                       (Signed Final 05/11/2023 02:46 pm) ---------------------------------------------------------------------- Patient Info  ID #:       [id number]                          D.O.B.:  03-Sep-2001 (22 yrs)  Name:       EMANUELLY BLEECKER Koons                Visit Date: 05/11/2023 01:26 pm ---------------------------------------------------------------------- Performed By  Attending:        Noralee Space MD        Ref. Address:     Faculty  Performed By:     Anabel Halon          [facility]  Referred By:      Gigi Gin                    CONSTANT MD ---------------------------------------------------------------------- Orders  #  Description                           Code        Ordered By  1  [place] MFM OB DETAIL +14 WK               62130.86    PEGGY CONSTANT ----------------------------------------------------------------------  #  Order #                     Accession #  Episode #  1  [id number]                   [id number]                 [id number] ---------------------------------------------------------------------- Indications  Obesity complicating pregnancy, second         O99.212  trimester (BMI 36)  Asthma                                         O99.89 j45.909  [redacted] weeks gestation of pregnancy                Z3A.19  Encounter for antenatal screening for          Z36.3  malformations  LR NIPS/Neg Horizon/Neg AFP ---------------------------------------------------------------------- Fetal Evaluation  Num Of Fetuses:         1  Fetal Heart Rate(bpm):  142  Cardiac Activity:       Observed  Presentation:           Cephalic  Placenta:               Anterior  P. Cord Insertion:      Visualized  Amniotic Fluid  AFI FV:      Within normal limits                              Largest Pocket(cm)                              4.79 ---------------------------------------------------------------------- Biometry  BPD:      45.3  mm     G. Age:  19w 5d         79  %    CI:        76.49   %    70 - 86                                                          FL/HC:      18.0   %    16.1 - 18.3  HC:      164.1  mm     G. Age:  19w 1d         50  %    HC/AC:      1.14        1.09 - 1.39  AC:      143.7  mm     G. Age:  19w 5d         70  %    FL/BPD:     65.3   %  FL:       29.6  mm     G. Age:  19w 1d         48  %    FL/AC:      20.6   %    20 - 24  HUM:      30.3  mm     G. Age:  20w 0d         76  %  CER:  20  mm     G. Age:  19w  2d         50  %  NFT:       3.8  mm  LV:          7  mm  CM:        4.9  mm  Est. FW:     291  gm    0 lb 10 oz      70  % ---------------------------------------------------------------------- OB History  Blood Type:   O+  Gravidity:    1         Term:   0        Prem:   0        SAB:   0  TOP:          0       Ectopic:  0        Living: 0 ---------------------------------------------------------------------- Gestational Age  LMP:           19w 0d        Date:  12/29/22                   EDD:   10/05/23  U/S Today:     19w 3d                                        EDD:   10/02/23  Best:          19w 0d     Det. By:  LMP  (12/29/22)          EDD:   10/05/23 ---------------------------------------------------------------------- Anatomy  Cranium:               Appears normal         LVOT:                   Appears normal  Cavum:                 Appears normal         Aortic Arch:            Appears normal  Ventricles:            Appears normal         Ductal Arch:            Appears normal  Choroid Plexus:        Appears normal         Diaphragm:              Appears normal  Cerebellum:            Appears normal         Stomach:                Appears normal, left                                                                        sided  Posterior Fossa:       Appears normal  Abdomen:                Appears normal  Nuchal Fold:           Appears normal         Abdominal Wall:         Appears nml (cord                                                                        insert, abd wall)  Face:                  Orbits nl; profile not Cord Vessels:           Appears normal (3                         well visualized                                vessel cord)  Lips:                  Appears normal         Kidneys:                Appear normal  Palate:                Not well visualized    Bladder:                Appears normal  Thoracic:              Appears normal         Spine:                  Appears normal   Heart:                 Appears normal         Upper Extremities:      Appears normal                         (4CH, axis, and                         situs)  RVOT:                  Appears normal         Lower Extremities:      Appears normal  Other:  Fetus appears to be a female. Lenses visualized. Feet and heels          visualized. VC, 3VV and 3VTV visualized. ---------------------------------------------------------------------- Cervix Uterus Adnexa  Cervix  Length:            3.3  cm.  Normal appearance by transabdominal scan  Uterus  No abnormality visualized.  Right Ovary  Within normal limits.  Left Ovary  Within normal limits.  Cul De Sac  No free fluid seen.  Adnexa  No abnormality visualized ---------------------------------------------------------------------- Impression  G1 P0. Patient is here for fetal anatomy scan.  Advanced maternal age.  On cell-free fetal DNA screening,  the risks of aneuploidies are not increased. MSAFP  screening showed low risk for open-neural tube defects.  We performed fetal anatomy scan. No makers of  aneuploidies or fetal structural defects are seen. Fetal  biometry is consistent with her previously-established dates.  Amniotic fluid is normal and good fetal activity is seen.  Patient understands the limitations of ultrasound in detecting  fetal anomalies.  As maternal obesity imposes limitations on the resolution of  images, fetal anomalies may be missed. ---------------------------------------------------------------------- Recommendations  -An appointment was made for her to return in 5 weeks for  completion of fetal anatomy. ----------------------------------------------------------------------                 Noralee Space, MD Electronically Signed Final Report   05/11/2023 02:46 pm ----------------------------------------------------------------------    MAU Course/MDM: I have reviewed the triage vital signs and the nursing notes.   Pertinent labs & imaging results  that were available during my care of the patient were reviewed by me and considered in my medical decision making (see chart for details).  I have reviewed her medical records including past results, notes and treatments.   I have ordered labs and reviewed results.  Treatments in MAU included 2L LR bolus, respiratory viral panel swab, EKG, Duoneb.    Assessment: 1. Moderate persistent asthma, unspecified whether complicated   2. [redacted] weeks gestation of pregnancy   3. COVID-19 affecting pregnancy in second trimester    EKG sinus tachycardia, nonischemic Poor air movement, saturations 98% well compensated Noncompliant with maintenance or rescue inhalers for asthma COVID positive  Chest tightness improved after duo neb Body aches, feverishness improved after fluids  Plan: Discharge home Paxlovid prescribed Asthma action plan provided; return precautions provided Refilled inhalers; levo albuterol (covered), budesonide-formoterol Labor precautions and fetal kick counts Follow up in Office for prenatal visits and recheck  Follow-up Information     [facility] Follow up.   Why: Continue scheduled prenatal care As needed for worsening symptoms or labor concerns Contact information: [address] [place] [address] [phone]                Pt stable at time of discharge.  Wyn Forster, MD FMOB Fellow, Faculty practice [facility], [facility]  06/05/2023 9:57 AM

## 2023-06-05 NOTE — MAU Provider Note (Signed)
History     CSN: [id number]  Arrival date and time: 06/05/23 1826   Event Date/Time   First Provider Initiated Contact with Patient 06/05/23 2014      Chief Complaint  Patient presents with   Abdominal Pain   Chest Pain   Jessica Hendrix , a  22 y.o. G1P0000 at [redacted]w[redacted]d presents to MAU with complaints of worsening difficulty breathing and throat soreness, after being diagnosed with COVID. Patient was dx'd with covid this AM. She was sent home with 2 inhalers and paxlovid. She denies taking any of it. She reports that she laid down when she got home and noticed that her body aches were getting worse and she stated that she was having more trouble taking deep breaths. She reports taking mucinex around 230pm this afternoon with minimal relief. She states becauseshe felt like it was getting worse she returned to MAU. She also reports feeling nauseated and 2 episodes of emesis today. She states she has not really been eating because of the nausea and denies attempting to relieve symptoms. She has no pregnancy complaints.          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   GAD (generalized anxiety disorder) 03/08/2020   Twitch 12/05/2019    Past Surgical History:  Procedure Laterality Date   WISDOM TOOTH EXTRACTION      Family History  Problem Relation Age of Onset   Asthma Mother    Sleep apnea Mother    Obstructive Sleep Apnea Mother    Diabetes Father    Allergic rhinitis Neg Hx    Angioedema Neg Hx    Atopy Neg Hx    Eczema Neg Hx    Immunodeficiency Neg Hx    Urticaria Neg Hx     Social History   Tobacco Use   Smoking status: Never   Smokeless tobacco: Never   Vaping Use   Vaping status: Never Used  Substance Use Topics   Alcohol use: Not Currently   Drug use: Not Currently    Types: Marijuana    Comment: last use feb 2024    Allergies:  Allergies  Allergen Reactions   Cinnamon     spices   Penicillins Other (See Comments)    unk   Citrus Rash    Medications Prior to Admission  Medication Sig Dispense Refill Last Dose   Blood Pressure Monitoring (BLOOD PRESSURE KIT) DEVI 1 Device by Does not apply route once a week. (Patient not taking: Reported on 04/19/2023) 1 each 0    budesonide-formoterol (SYMBICORT) 80-4.5 MCG/ACT inhaler Inhale 2 puffs into the lungs 2 (two) times daily. 1 each 3    fluticasone (FLONASE) 50 MCG/ACT nasal spray Place 1 spray into both nostrils 2 (two) times daily. Place 1 spray into both nostrils twice daily 16 g 0    levalbuterol (XOPENEX HFA) 45 MCG/ACT inhaler Inhale 2 puffs into the  lungs every 6 (six) hours as needed for wheezing or shortness of breath. 15 each 1    nirmatrelvir/ritonavir (PAXLOVID) 20 x 150 MG & 10 x 100MG  TABS Take 3 tablets by mouth 2 (two) times daily for 5 days. Take nirmatrelvir (150 mg) two tablets twice daily for 5 days and ritonavir (100 mg) one tablet twice daily for 5 days. 30 each 0    ondansetron (ZOFRAN) 8 MG tablet Take 1 tablet (8 mg) by mouth every 8 hours as needed for nausea or vomiting. 20 tablet 0    Prenatal Vit-Fe Fumarate-FA (PRENATAL VITAMIN PO) Take 1 tablet by mouth daily.      pyridOXINE (VITAMIN B6) 25 MG tablet Take 25 mg by mouth daily.       Review of Systems  Constitutional:  Positive for chills, fatigue and fever.  Eyes:  Negative for pain and visual disturbance.  Respiratory:  Positive for shortness of breath. Negative for apnea and wheezing.   Cardiovascular:  Positive for chest pain. Negative for palpitations.  Gastrointestinal:  Positive for nausea and vomiting. Negative for abdominal pain, constipation and diarrhea.  Genitourinary:  Negative for  difficulty urinating, dysuria, pelvic pain, vaginal bleeding, vaginal discharge and vaginal pain.  Musculoskeletal:  Positive for back pain.  Neurological:  Positive for weakness. Negative for seizures and headaches.  Psychiatric/Behavioral:  Negative for suicidal ideas.    Physical Exam   Blood pressure 135/74, pulse 91, temperature 98.2 F (36.8 C), temperature source Oral, resp. rate 20, last menstrual period 12/29/2022, SpO2 100%.  Physical Exam Vitals and nursing note reviewed.  Constitutional:      General: She is not in acute distress.    Appearance: Normal appearance. She is ill-appearing.  HENT:     Head: Normocephalic.  Cardiovascular:     Heart sounds: Normal heart sounds.  Pulmonary:     Effort: Pulmonary effort is normal.     Breath sounds: Normal breath sounds.     Comments: Patient not taking deep breaths due to discomfort. No abnormal breath sounds heard.  Chest:     Chest wall: No mass or tenderness.  Abdominal:     Palpations: Abdomen is soft.     Tenderness: There is no abdominal tenderness.  Musculoskeletal:     Cervical back: Normal range of motion.  Skin:    General: Skin is warm and dry.     Capillary Refill: Capillary refill takes 2 to 3 seconds.  Neurological:     Mental Status: She is alert and oriented to person, place, and time.  Psychiatric:        Mood and Affect: Mood normal.    FHT obtained in triage   MAU Course  Procedures Meds ordered this encounter  Medications   guaiFENesin (MUCINEX) 12 hr tablet 600 mg   phenol (CHLORASEPTIC) mouth spray 1 spray   acetaminophen (TYLENOL) tablet 1,000 mg   ondansetron (ZOFRAN-ODT) disintegrating tablet 4 mg   scopolamine (TRANSDERM-SCOP) 1 MG/3DAYS 1.5 mg   Results for orders placed or performed during the hospital encounter of 06/05/23 (from the past 24 hour(s))  Urinalysis, Routine w reflex microscopic -Urine, Clean Catch     Status: Abnormal   Collection Time: 06/05/23  6:54 AM  Result  Value Ref Range   Color, Urine AMBER (A) YELLOW   APPearance HAZY (A) CLEAR   Specific Gravity, Urine 1.027 1.005 - 1.030   pH 5.0 5.0 - 8.0   Glucose, UA NEGATIVE NEGATIVE mg/dL   Hgb urine dipstick  NEGATIVE NEGATIVE   Bilirubin Urine SMALL (A) NEGATIVE   Ketones, ur 20 (A) NEGATIVE mg/dL   Protein, ur 30 (A) NEGATIVE mg/dL   Nitrite NEGATIVE NEGATIVE   Leukocytes,Ua NEGATIVE NEGATIVE   RBC / HPF 0-5 0 - 5 RBC/hpf   WBC, UA 6-10 0 - 5 WBC/hpf   Bacteria, UA FEW (A) NONE SEEN   Squamous Epithelial / HPF 11-20 0 - 5 /HPF   Mucus PRESENT   SARS Coronavirus 2 by RT PCR (hospital order, performed in [facility] hospital lab) *cepheid single result test* Anterior Nasal Swab     Status: Abnormal   Collection Time: 06/05/23  7:02 AM   Specimen: Anterior Nasal Swab  Result Value Ref Range   SARS Coronavirus 2 by RT PCR POSITIVE (A) NEGATIVE  Respiratory (~20 pathogens) panel by PCR     Status: None   Collection Time: 06/05/23  7:02 AM   Specimen: Nasopharyngeal Swab; Respiratory  Result Value Ref Range   Adenovirus NOT DETECTED NOT DETECTED   Coronavirus 229E NOT DETECTED NOT DETECTED   Coronavirus HKU1 NOT DETECTED NOT DETECTED   Coronavirus NL63 NOT DETECTED NOT DETECTED   Coronavirus OC43 NOT DETECTED NOT DETECTED   Metapneumovirus NOT DETECTED NOT DETECTED   Rhinovirus / Enterovirus NOT DETECTED NOT DETECTED   Influenza A NOT DETECTED NOT DETECTED   Influenza B NOT DETECTED NOT DETECTED   Parainfluenza Virus 1 NOT DETECTED NOT DETECTED   Parainfluenza Virus 2 NOT DETECTED NOT DETECTED   Parainfluenza Virus 3 NOT DETECTED NOT DETECTED   Parainfluenza Virus 4 NOT DETECTED NOT DETECTED   Respiratory Syncytial Virus NOT DETECTED NOT DETECTED   Bordetella pertussis NOT DETECTED NOT DETECTED   Bordetella Parapertussis NOT DETECTED NOT DETECTED   Chlamydophila pneumoniae NOT DETECTED NOT DETECTED   Mycoplasma pneumoniae NOT DETECTED NOT DETECTED    MDM  - Reviewed expectations of  COVID in conjunction with Asthma.  - Normal VS and improved lung sounds. No need to repeat DuoNeb tx at this time.  - Reviewed the importance of using her inhalers as prescribed and the recommendation to rest and stay on top of medication for relief of symptoms.  - Several medications ordered to help with symptoms of COVID.  - Upon reassessment patient reports "feeling much better".  - Plan for discharge.   Assessment and Plan   1. COVID-19 affecting pregnancy in second trimester   2. [redacted] weeks gestation of pregnancy   3. Uncomplicated asthma, unspecified asthma severity, unspecified whether persistent    - Reviewed worsening signs and return precautions.  - Reiterate use of inhalers and rest with PO hydration as much as possible. Patient verbalized understanding.  - Rx for Symbicort and Albuterol sent to outpatient pharmacy for pick up.  - Preterm labor precautions reviewed.  - Patient discharged home in stable condition and may return to MAU as needed.   Claudette Head, MSN CNM  06/05/2023, 8:14 PM

## 2023-06-05 NOTE — MAU Note (Signed)
..  Jessica Hendrix is a 22 y.o. at [redacted]w[redacted]d here in MAU reporting:  Reports N/V 2 times today. reports tightness in her abdomen, every now and then.  Chest pain that hurts a lot more than before 9/10 Took mucinex at 1430  Pain score: 9/10 chest; 4/10 abdomen Vitals:   06/05/23 1955  BP: 135/74  Pulse: 91  Resp: 20  Temp: 98.2 F (36.8 C)  SpO2: 100%     FHT:150 Lab orders placed from triage: none

## 2023-06-05 NOTE — MAU Note (Signed)
Called patient from lobby, no answer x 1

## 2023-06-07 LAB — CULTURE, GROUP A STREP (THRC)

## 2023-06-13 ENCOUNTER — Encounter: Payer: Self-pay | Admitting: *Deleted

## 2023-06-15 ENCOUNTER — Ambulatory Visit: Payer: 59 | Attending: Obstetrics and Gynecology

## 2023-06-15 DIAGNOSIS — O99512 Diseases of the respiratory system complicating pregnancy, second trimester: Secondary | ICD-10-CM | POA: Diagnosis not present

## 2023-06-15 DIAGNOSIS — Z362 Encounter for other antenatal screening follow-up: Secondary | ICD-10-CM | POA: Diagnosis not present

## 2023-06-15 DIAGNOSIS — Z3492 Encounter for supervision of normal pregnancy, unspecified, second trimester: Secondary | ICD-10-CM

## 2023-06-15 DIAGNOSIS — J45909 Unspecified asthma, uncomplicated: Secondary | ICD-10-CM | POA: Insufficient documentation

## 2023-06-15 DIAGNOSIS — O99212 Obesity complicating pregnancy, second trimester: Secondary | ICD-10-CM | POA: Insufficient documentation

## 2023-06-15 DIAGNOSIS — E669 Obesity, unspecified: Secondary | ICD-10-CM

## 2023-06-15 DIAGNOSIS — Z3A24 24 weeks gestation of pregnancy: Secondary | ICD-10-CM | POA: Diagnosis not present

## 2023-06-16 ENCOUNTER — Other Ambulatory Visit: Payer: Self-pay | Admitting: *Deleted

## 2023-06-16 DIAGNOSIS — O99212 Obesity complicating pregnancy, second trimester: Secondary | ICD-10-CM

## 2023-06-22 ENCOUNTER — Encounter: Payer: 59 | Admitting: Obstetrics and Gynecology

## 2023-06-23 ENCOUNTER — Other Ambulatory Visit: Payer: Self-pay

## 2023-06-24 ENCOUNTER — Other Ambulatory Visit: Payer: Self-pay | Admitting: Obstetrics and Gynecology

## 2023-06-26 ENCOUNTER — Ambulatory Visit (INDEPENDENT_AMBULATORY_CARE_PROVIDER_SITE_OTHER): Payer: 59 | Admitting: Obstetrics and Gynecology

## 2023-06-26 VITALS — BP 121/70 | HR 83 | Wt 200.0 lb

## 2023-06-26 DIAGNOSIS — O219 Vomiting of pregnancy, unspecified: Secondary | ICD-10-CM

## 2023-06-26 DIAGNOSIS — Z3480 Encounter for supervision of other normal pregnancy, unspecified trimester: Secondary | ICD-10-CM

## 2023-06-26 DIAGNOSIS — Z3A25 25 weeks gestation of pregnancy: Secondary | ICD-10-CM

## 2023-06-26 MED ORDER — ONDANSETRON HCL 8 MG PO TABS
8.0000 mg | ORAL_TABLET | Freq: Three times a day (TID) | ORAL | 1 refills | Status: DC | PRN
Start: 2023-06-26 — End: 2023-07-31

## 2023-06-26 NOTE — Progress Notes (Signed)
ROB, wants Water Birth, Pt should be scheduled with Midwife.

## 2023-06-26 NOTE — Progress Notes (Signed)
   PRENATAL VISIT NOTE  Subjective:  Jessica Hendrix is a 22 y.o. G1P0000 at [redacted]w[redacted]d being seen today for ongoing prenatal care.  She is currently monitored for the following issues for this low-risk pregnancy and has Moderate persistent asthma without complication and Obesity affecting pregnancy, antepartum on their problem list.  Patient doing well with no acute concerns today. She reports no complaints.  Contractions: Not present. Vag. Bleeding: None.  Movement: Present. Denies leaking of fluid.   The following portions of the patient's history were reviewed and updated as appropriate: allergies, current medications, past family history, past medical history, past social history, past surgical history and problem list. Problem list updated.  Objective:   Vitals:   06/26/23 1625  BP: 121/70  Pulse: 83  Weight: 200 lb (90.7 kg)    Fetal Status: Fetal Heart Rate (bpm): 150 Fundal Height: 25 cm Movement: Present     General:  Alert, oriented and cooperative. Patient is in no acute distress.  Skin: Skin is warm and dry. No rash noted.   Cardiovascular: Normal heart rate noted  Respiratory: Normal respiratory effort, no problems with respiration noted  Abdomen: Soft, gravid, appropriate for gestational age.  Pain/Pressure: Absent     Pelvic: Cervical exam deferred        Extremities: Normal range of motion.  Edema: None  Mental Status:  Normal mood and affect. Normal behavior. Normal judgment and thought content.   Assessment and Plan:  Pregnancy: G1P0000 at [redacted]w[redacted]d  1. Supervision of other normal pregnancy, antepartum Continue routine prenatal care Formal 2 hour GTT next visit  2. Nausea and vomiting in pregnancy Pt requests refill of medication due to continued symptoms - ondansetron (ZOFRAN) 8 MG tablet; Take 1 tablet (8 mg total) by mouth every 8 (eight) hours as needed for nausea or vomiting.  Dispense: 30 tablet; Refill: 1  3. [redacted] weeks gestation of pregnancy   Preterm  labor symptoms and general obstetric precautions including but not limited to vaginal bleeding, contractions, leaking of fluid and fetal movement were reviewed in detail with the patient.  Please refer to After Visit Summary for other counseling recommendations.   Return in about 3 weeks (around 07/17/2023) for ROB, in person, 2 hr GTT, 3rd trim labs.   Mariel Aloe, MD Faculty Attending [facility]

## 2023-06-26 NOTE — Patient Instructions (Signed)

## 2023-07-12 DIAGNOSIS — Z3483 Encounter for supervision of other normal pregnancy, third trimester: Secondary | ICD-10-CM | POA: Diagnosis not present

## 2023-07-12 DIAGNOSIS — Z3482 Encounter for supervision of other normal pregnancy, second trimester: Secondary | ICD-10-CM | POA: Diagnosis not present

## 2023-07-14 ENCOUNTER — Ambulatory Visit: Payer: 59 | Admitting: Family

## 2023-07-17 ENCOUNTER — Ambulatory Visit (INDEPENDENT_AMBULATORY_CARE_PROVIDER_SITE_OTHER): Payer: 59 | Admitting: Family Medicine

## 2023-07-17 ENCOUNTER — Ambulatory Visit: Payer: 59 | Admitting: Family Medicine

## 2023-07-17 VITALS — BP 128/80 | HR 99 | Temp 98.2°F | Ht 67.0 in | Wt 205.4 lb

## 2023-07-17 DIAGNOSIS — H6121 Impacted cerumen, right ear: Secondary | ICD-10-CM

## 2023-07-17 DIAGNOSIS — M26621 Arthralgia of right temporomandibular joint: Secondary | ICD-10-CM

## 2023-07-17 DIAGNOSIS — L906 Striae atrophicae: Secondary | ICD-10-CM | POA: Diagnosis not present

## 2023-07-17 DIAGNOSIS — H60331 Swimmer's ear, right ear: Secondary | ICD-10-CM

## 2023-07-17 DIAGNOSIS — Z331 Pregnant state, incidental: Secondary | ICD-10-CM

## 2023-07-17 MED ORDER — NEOMYCIN-POLYMYXIN-HC 3.5-10000-1 OT SOLN: 3.0000 [drp] | Freq: Three times a day (TID) | OTIC | 0 refills | Status: DC

## 2023-07-17 NOTE — Progress Notes (Signed)
Subjective  CC:  Chief Complaint  Patient presents with   Ear Pain    Rt ear pain for the pass week and has not gotten any better. Pt has been Dx with Pityriasis rosea in the pass and she has a patch on her stomach that she is concerned about    Same day acute visit; PCP not available. New pt to me. Chart reviewed.  HPI: Jessica Hendrix is a 22 y.o. female who presents to the office today to address the problems listed above in the chief complaint. 22yo G1 P0 at approx 29EGA w/ right ear pain/fullness w/o f/c/s, st, or cold sxs. Has allergic rhinitis. Tylenol helps. Has h/o cerumen impactions and swimmer's ear.  Has dark spots on right lower abdomen x 2 months w/o change. Wants to be sure it is ok.    Assessment  1. Impacted cerumen, right ear   2. Pregnant state, incidental   3. Acute swimmer's ear of right side   4. Arthralgia of right temporomandibular joint   5. Stretch marks      Plan  Ear pain:  cleared impaction; mild redness and tenderness of eac so abx/steroid drops ordered. Tylenol for TMJ sxs and education ? Stretch marks: no sxs or sign of infectious etiology. Will monitor.    Follow up: prn Visit date not found  No orders of the defined types were placed in this encounter.  No orders of the defined types were placed in this encounter.     I reviewed the patients updated PMH, FH, and SocHx.    Patient Active Problem List   Diagnosis Date Noted   Obesity affecting pregnancy, antepartum 05/08/2023   Moderate persistent asthma without complication 01/13/2023   Current Meds  Medication Sig   Blood Pressure Monitoring (BLOOD PRESSURE KIT) DEVI 1 Device by Does not apply route once a week.   budesonide-formoterol (SYMBICORT) 80-4.5 MCG/ACT inhaler Inhale 2 puffs into the lungs every 6 (six) hours.   budesonide-formoterol (SYMBICORT) 80-4.5 MCG/ACT inhaler Inhale 2 puffs into the lungs every 6 (six) hours.   budesonide-formoterol (SYMBICORT) 80-4.5 MCG/ACT  inhaler Inhale 2 puffs into the lungs every 6 (six) hours.   fluticasone (FLONASE) 50 MCG/ACT nasal spray Place 1 spray into both nostrils 2 (two) times daily. Place 1 spray into both nostrils twice daily   ondansetron (ZOFRAN) 8 MG tablet Take 1 tablet (8 mg total) by mouth every 8 (eight) hours as needed for nausea or vomiting.   Prenatal Vit-Fe Fumarate-FA (PRENATAL VITAMIN PO) Take 1 tablet by mouth daily.   pyridOXINE (VITAMIN B6) 25 MG tablet Take 25 mg by mouth daily.    Allergies: Patient is allergic to cinnamon, penicillins, and citrus. Family History: Patient family history includes Asthma in her mother; Diabetes in her father; Obstructive Sleep Apnea in her mother; Sleep apnea in her mother. Social History:  Patient  reports that she has never smoked. She has never used smokeless tobacco. She reports that she does not currently use alcohol. She reports that she does not currently use drugs after having used the following drugs: Marijuana.  Review of Systems: Constitutional: Negative for fever malaise or anorexia Cardiovascular: negative for chest pain Respiratory: negative for SOB or persistent cough Gastrointestinal: negative for abdominal pain  Objective  Vitals: BP 128/80   Pulse 99   Temp 98.2 F (36.8 C)   Ht 5\' 7"  (1.702 m)   Wt 205 lb 6.4 oz (93.2 kg)   LMP 12/29/2022 (Exact Date)  SpO2 98%   BMI 32.17 kg/m  General: no acute distress , A&Ox3 HEENT: PEERL, conjunctiva normal, neck is supple, op clear, no LAD, + tender right TMJoint. Right cerumen impaction cleared with irrigation, nl TM bilaterally Gravid non tender abdomen Skin:  Warm, right lower abdomen with several hyperpigmented spots in small patch. No flaking, ulcers, vesicles, erythema  Commons side effects, risks, benefits, and alternatives for medications and treatment plan prescribed today were discussed, and the patient expressed understanding of the given instructions. Patient is instructed to call  or message via MyChart if he/she has any questions or concerns regarding our treatment plan. No barriers to understanding were identified. We discussed Red Flag symptoms and signs in detail. Patient expressed understanding regarding what to do in case of urgent or emergency type symptoms.  Medication list was reconciled, printed and provided to the patient in AVS. Patient instructions and summary information was reviewed with the patient as documented in the AVS. This note was prepared with assistance of Dragon voice recognition software. Occasional wrong-word or sound-a-like substitutions may have occurred due to the inherent limitations of voice recognition software

## 2023-07-18 ENCOUNTER — Encounter: Payer: Self-pay | Admitting: *Deleted

## 2023-07-18 ENCOUNTER — Telehealth: Payer: Self-pay | Admitting: Family Medicine

## 2023-07-18 NOTE — Telephone Encounter (Signed)
Patient notified of message below.

## 2023-07-18 NOTE — Telephone Encounter (Signed)
Clinical Call: Pt unsure if she can use ear drops due to her pregnancy. Please advise  Patient Name First: Jessica Last: Hendrix Gender: Female DOB: 2000-11-21 Age: 22 Y 7 M 23 D Return Phone Number: [phone] (Primary) Address: City/ State/ Zip: [place]  [address] Client [facility] - Human resources officer [facility] [employer] Provider Asencion Partridge- MD Contact Type Call Who Is Calling Patient / Member / Family / Caregiver Call Type Triage / Clinical Relationship To Patient Self Return Phone Number [phone] (Primary) Chief Complaint Earache Reason for Call Symptomatic / Request for Health Information Initial Comment Caller states she had a question about a medication. The pt was giving a medication for her ear and she has an infection going on. Translation No Nurse Assessment Nurse: Caryn Bee, RN, Ayrine Date/Time (Eastern Time): 07/17/2023 6:57:56 PM Confirm and document reason for call. If symptomatic, describe symptoms. ---Caller states she was prescribed cortisporin drops for an ear infection. PT states she is [redacted] weeks pregnant. Does the patient have any new or worsening symptoms? ---No Disp. Time Lamount Cohen Time) Disposition Final User 07/17/2023 6:51:35 PM Send To RN Personal Marti Sleigh, RN, Lequita Halt 07/17/2023 7:05:58 PM Clinical Call Yes Caryn Bee, RN, Ayrine Final Disposition 07/17/2023 7:05:58 PM Clinical Call Yes Caryn Bee, RN, Ayrine Comments User: Mickle Mallory, RN Date/Time Lamount Cohen Time): 07/17/2023 7:05:34 PM Caller was advised to call back during normal business hours for clarification on this new medication. Patient is unsure if she can take this while pregnant. Online resources state this is a pregnancy category C medication.

## 2023-07-19 ENCOUNTER — Encounter: Payer: Self-pay | Admitting: Obstetrics and Gynecology

## 2023-07-19 ENCOUNTER — Other Ambulatory Visit: Payer: 59

## 2023-07-19 ENCOUNTER — Ambulatory Visit (INDEPENDENT_AMBULATORY_CARE_PROVIDER_SITE_OTHER): Payer: 59 | Admitting: Obstetrics and Gynecology

## 2023-07-19 VITALS — BP 116/62 | HR 75 | Wt 207.2 lb

## 2023-07-19 DIAGNOSIS — M549 Dorsalgia, unspecified: Secondary | ICD-10-CM

## 2023-07-19 DIAGNOSIS — O99891 Other specified diseases and conditions complicating pregnancy: Secondary | ICD-10-CM

## 2023-07-19 DIAGNOSIS — Z3480 Encounter for supervision of other normal pregnancy, unspecified trimester: Secondary | ICD-10-CM

## 2023-07-19 DIAGNOSIS — R519 Headache, unspecified: Secondary | ICD-10-CM | POA: Diagnosis not present

## 2023-07-19 DIAGNOSIS — Z3A28 28 weeks gestation of pregnancy: Secondary | ICD-10-CM

## 2023-07-19 DIAGNOSIS — O26893 Other specified pregnancy related conditions, third trimester: Secondary | ICD-10-CM | POA: Diagnosis not present

## 2023-07-19 NOTE — Progress Notes (Signed)
Pt presents for ROB visit.  Requesting Tdap Declines Flu vaccine Pt c/o headache and stomach pain

## 2023-07-19 NOTE — Progress Notes (Signed)
   PRENATAL VISIT NOTE  Subjective:  Jessica Hendrix is a 22 y.o. G1P0000 at [redacted]w[redacted]d being seen today for ongoing prenatal care.  She is currently monitored for the following issues for this low-risk pregnancy and has Moderate persistent asthma without complication; Supervision of other normal pregnancy, antepartum; and Obesity affecting pregnancy, antepartum on their problem list.  Patient reports  Contractions: Irritability. Vag. Bleeding: None.  Movement: Present. Denies leaking of fluid.   The following portions of the patient's history were reviewed and updated as appropriate: allergies, current medications, past family history, past medical history, past social history, past surgical history and problem list.   Objective:   Vitals:   07/19/23 0820  BP: 116/62  Pulse: 75  Weight: 207 lb 3.2 oz (94 kg)    Fetal Status: Fetal Heart Rate (bpm): 149 Fundal Height: 28 cm Movement: Present     General:  Alert, oriented and cooperative. Patient is in no acute distress.  Skin: Skin is warm and dry. No rash noted.   Cardiovascular: Normal heart rate noted  Respiratory: Normal respiratory effort, no problems with respiration noted  Abdomen: Soft, gravid, appropriate for gestational age.  Pain/Pressure: Present   neg murphy sign, negative mcburney   Pelvic: Cervical exam deferred        Extremities: Normal range of motion.  Edema: None  Mental Status: Normal mood and affect. Normal behavior. Normal judgment and thought content.   Assessment and Plan:  Pregnancy: G1P0000 at [redacted]w[redacted]d 1. Supervision of other normal pregnancy, antepartum BP and FHR normal  Feeling regular fetal movement   2. [redacted] weeks gestation of pregnancy GTT and labs today  Tdap given today  - HIV antibody (with reflex) - CBC - RPR - Glucose Tolerance, 2 Hours w/1 Hour  3. Pregnancy headache in third trimester Discussed tx options exedrine tension, magnesium. Strict precautions when to follow up or go to MAU.  Checking labs today - Comp Met (CMET) - Protein / creatinine ratio, urine tinine ratio, urine  4. Back pain affecting pregnancy in third trimester Supportive measures including maternity belt, would like pt referral, referral placed   Preterm labor symptoms and general obstetric precautions including but not limited to vaginal bleeding, contractions, leaking of fluid and fetal movement were reviewed in detail with the patient. Please refer to After Visit Summary for other counseling recommendations.   Return in about 2 weeks (around 08/02/2023), or midwife.  Future Appointments  Date Time Provider Department Center  08/02/2023  4:10 PM Gerrit Heck, CNM [facility] None  08/10/2023  3:30 PM [facility]  08/16/2023  4:10 PM Warden Fillers, MD [facility] None  08/30/2023  4:10 PM Raelyn Mora, CNM [facility] None  09/13/2023  4:10 PM Sue Lush, FNP [facility] None  09/18/2023  4:10 PM Lennart Pall, MD [facility] None     Albertine Grates, FNP

## 2023-07-20 ENCOUNTER — Encounter: Payer: Self-pay | Admitting: Obstetrics and Gynecology

## 2023-07-20 ENCOUNTER — Encounter: Payer: 59 | Admitting: Obstetrics & Gynecology

## 2023-07-20 ENCOUNTER — Encounter: Payer: 59 | Admitting: Obstetrics and Gynecology

## 2023-07-20 ENCOUNTER — Other Ambulatory Visit: Payer: 59

## 2023-07-20 DIAGNOSIS — O99113 Other diseases of the blood and blood-forming organs and certain disorders involving the immune mechanism complicating pregnancy, third trimester: Secondary | ICD-10-CM

## 2023-07-20 DIAGNOSIS — D696 Thrombocytopenia, unspecified: Secondary | ICD-10-CM | POA: Insufficient documentation

## 2023-07-20 HISTORY — DX: Thrombocytopenia, unspecified: O99.113

## 2023-07-20 LAB — COMPREHENSIVE METABOLIC PANEL
ALT: 12 [IU]/L (ref 0–32)
AST: 12 [IU]/L (ref 0–40)
Albumin: 3.6 g/dL — ABNORMAL LOW (ref 4.0–5.0)
Alkaline Phosphatase: 121 [IU]/L — ABNORMAL HIGH (ref 42–106)
BUN/Creatinine Ratio: 13 (ref 9–23)
BUN: 10 mg/dL (ref 6–20)
Bilirubin Total: <0.2 mg/dL (ref 0.0–1.2)
CO2: 21 mmol/L (ref 20–29)
Calcium: 9.2 mg/dL (ref 8.7–10.2)
Chloride: 101 mmol/L (ref 96–106)
Creatinine, Ser: 0.75 mg/dL (ref 0.57–1.00)
Globulin, Total: 2.7 g/dL (ref 1.5–4.5)
Glucose: 106 mg/dL — ABNORMAL HIGH (ref 70–99)
Potassium: 4.2 mmol/L (ref 3.5–5.2)
Sodium: 136 mmol/L (ref 134–144)
Total Protein: 6.3 g/dL (ref 6.0–8.5)
eGFR: 117 mL/min/{1.73_m2} (ref >59)

## 2023-07-20 LAB — CBC
Hematocrit: 36.7 % (ref 34.0–46.6)
Hemoglobin: 11.6 g/dL (ref 11.1–15.9)
MCH: 27.9 pg (ref 26.6–33.0)
MCHC: 31.6 g/dL (ref 31.5–35.7)
MCV: 88 fL (ref 79–97)
Platelets: 125 10*3/uL — ABNORMAL LOW (ref 150–450)
RBC: 4.16 x10E6/uL (ref 3.77–5.28)
RDW: 13.6 % (ref 11.7–15.4)
WBC: 11.1 10*3/uL — ABNORMAL HIGH (ref 3.4–10.8)

## 2023-07-20 LAB — HIV ANTIBODY (ROUTINE TESTING W REFLEX): HIV Screen 4th Generation wRfx: NONREACTIVE

## 2023-07-20 LAB — GLUCOSE TOLERANCE, 2 HOURS W/ 1HR
Glucose, 1 hour: 111 mg/dL (ref 70–179)
Glucose, 2 hour: 89 mg/dL (ref 70–152)
Glucose, Fasting: 74 mg/dL (ref 70–91)

## 2023-07-20 LAB — PROTEIN / CREATININE RATIO, URINE
Creatinine, Urine: 131.1 mg/dL
Protein, Ur: 26 mg/dL
Protein/Creat Ratio: 198 mg/g{creat} (ref 0–200)

## 2023-07-20 LAB — RPR: RPR Ser Ql: NONREACTIVE

## 2023-07-28 ENCOUNTER — Ambulatory Visit: Payer: 59 | Admitting: Physical Therapy

## 2023-07-30 ENCOUNTER — Encounter (HOSPITAL_COMMUNITY): Payer: Self-pay | Admitting: Obstetrics and Gynecology

## 2023-07-30 ENCOUNTER — Other Ambulatory Visit: Payer: Self-pay

## 2023-07-30 ENCOUNTER — Inpatient Hospital Stay (HOSPITAL_COMMUNITY)
Admission: AD | Admit: 2023-07-30 | Discharge: 2023-07-31 | Disposition: A | Discharge status: Home / Self Care | Payer: 59 | Attending: Obstetrics and Gynecology | Admitting: Obstetrics and Gynecology

## 2023-07-30 DIAGNOSIS — R1010 Upper abdominal pain, unspecified: Secondary | ICD-10-CM | POA: Diagnosis present

## 2023-07-30 DIAGNOSIS — R1011 Right upper quadrant pain: Secondary | ICD-10-CM | POA: Insufficient documentation

## 2023-07-30 DIAGNOSIS — Z3A30 30 weeks gestation of pregnancy: Secondary | ICD-10-CM | POA: Diagnosis not present

## 2023-07-30 DIAGNOSIS — O26893 Other specified pregnancy related conditions, third trimester: Secondary | ICD-10-CM | POA: Insufficient documentation

## 2023-07-30 DIAGNOSIS — O368130 Decreased fetal movements, third trimester, not applicable or unspecified: Secondary | ICD-10-CM | POA: Diagnosis present

## 2023-07-30 DIAGNOSIS — O219 Vomiting of pregnancy, unspecified: Secondary | ICD-10-CM | POA: Diagnosis not present

## 2023-07-30 DIAGNOSIS — R109 Unspecified abdominal pain: Secondary | ICD-10-CM | POA: Diagnosis not present

## 2023-07-30 DIAGNOSIS — R112 Nausea with vomiting, unspecified: Secondary | ICD-10-CM | POA: Diagnosis present

## 2023-07-30 LAB — COMPREHENSIVE METABOLIC PANEL
ALT: 17 U/L (ref 0–44)
AST: 15 U/L (ref 15–41)
Albumin: 2.6 g/dL — ABNORMAL LOW (ref 3.5–5.0)
Alkaline Phosphatase: 97 U/L (ref 38–126)
Anion gap: 7 (ref 5–15)
BUN: 10 mg/dL (ref 6–20)
CO2: 22 mmol/L (ref 22–32)
Calcium: 9.1 mg/dL (ref 8.9–10.3)
Chloride: 106 mmol/L (ref 98–111)
Creatinine, Ser: 0.75 mg/dL (ref 0.44–1.00)
GFR, Estimated: >60 mL/min (ref >60)
Glucose, Bld: 87 mg/dL (ref 70–99)
Potassium: 3.8 mmol/L (ref 3.5–5.1)
Sodium: 135 mmol/L (ref 135–145)
Total Bilirubin: 0.5 mg/dL (ref 0.3–1.2)
Total Protein: 6.1 g/dL — ABNORMAL LOW (ref 6.5–8.1)

## 2023-07-30 LAB — URINALYSIS, ROUTINE W REFLEX MICROSCOPIC
Bilirubin Urine: NEGATIVE
Glucose, UA: NEGATIVE mg/dL
Hgb urine dipstick: NEGATIVE
Ketones, ur: NEGATIVE mg/dL
Leukocytes,Ua: NEGATIVE
Nitrite: NEGATIVE
Protein, ur: NEGATIVE mg/dL
Specific Gravity, Urine: 1.025 (ref 1.005–1.030)
pH: 6 (ref 5.0–8.0)

## 2023-07-30 LAB — CBC
HCT: 33.4 % — ABNORMAL LOW (ref 36.0–46.0)
Hemoglobin: 10.9 g/dL — ABNORMAL LOW (ref 12.0–15.0)
MCH: 27.5 pg (ref 26.0–34.0)
MCHC: 32.6 g/dL (ref 30.0–36.0)
MCV: 84.1 fL (ref 80.0–100.0)
Platelets: 123 10*3/uL — ABNORMAL LOW (ref 150–400)
RBC: 3.97 MIL/uL (ref 3.87–5.11)
RDW: 13.7 % (ref 11.5–15.5)
WBC: 12.3 10*3/uL — ABNORMAL HIGH (ref 4.0–10.5)
nRBC: 0 % (ref 0.0–0.2)

## 2023-07-30 LAB — PROTEIN / CREATININE RATIO, URINE
Creatinine, Urine: 188 mg/dL
Protein Creatinine Ratio: 0.18 mg/mg{creat} — ABNORMAL HIGH (ref 0.00–0.15)
Total Protein, Urine: 33 mg/dL

## 2023-07-30 MED ORDER — ONDANSETRON 4 MG PO TBDP
8.0000 mg | ORAL_TABLET | Freq: Once | ORAL | Status: AC
  Administered 2023-07-30: 8 mg via ORAL
  Filled 2023-07-30: qty 2

## 2023-07-30 NOTE — MAU Note (Signed)
.  Jessica Hendrix is a 22 y.o. at [redacted]w[redacted]d here in MAU reporting: stomach pains since 0100 - stabbing pain below right rib that has been constant, along with N/V. Reports emesis x5 today. States fetal movements have changed - "there was a lot of rapid movement then he stopped moving completely" - last movement felt 2 hours ago. Denies VB or LOF. Reports ctx throughout the day. Took zofran at 1000.   Onset of complaint: 0100 Pain score: 7 - upper abdomen; 5 - ctx  Vitals:   07/30/23 2155  BP: 123/65  Pulse: (!) 101  Resp: 19  Temp: 98.6 F (37 C)  SpO2: 98%     FHT:145 Lab orders placed from triage:  UA

## 2023-07-30 NOTE — MAU Provider Note (Incomplete)
History     CSN: [id number]  Arrival date and time: 07/30/23 2141   Event Date/Time   First Provider Initiated Contact with Patient 07/30/23 2245      Chief Complaint  Patient presents with  . Decreased Fetal Movement  . Abdominal Pain  . Contractions  . Emesis  . Nausea   HPI [name] is a 22 y.o. year old G25P0000 female at [redacted]w[redacted]d weeks gestation who presents to MAU reporting "stabbing" pains in the right upper quadrant portion of her abdomen below her right breast since 0100.  She describes the pain as being constant; rated 7/10.  She reports the pain is so severe that it is caused her to have nausea and vomiting.  She reports having 5 episodes of vomiting today; she took Zofran at 1000.  She also reports fetal movement has changed "a lot of rapid movements then just stops completely"; last felt movement 2 hours ago.  She reports contractions all day; rated 5/10.  She denies vaginal bleeding or loss of fluid.  She receives [facility] with [facility]; next appt is 08/02/2023. Her partner is present and contributing to the history taking.   OB History     Gravida  1   Para  0   Term  0   Preterm  0   AB  0   Living  0      SAB  0   IAB  0   Ectopic  0   Multiple  0   Live Births  0           Past Medical History:  Diagnosis Date  . Allergy   . Anemia   . Anxiety   . Constipation   . Depression   . 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 GAD (generalized anxiety disorder) 03/08/2020  . Supervision of other normal pregnancy, antepartum 03/13/2023              NURSING     PROVIDER      Office Location    [facility]    Dating by    LMP c/w U/S at 7 wks      [facility] Model    Traditional    Anatomy U/S    incomplete      Initiated care at     [facility]     English                     LAB RESULTS       Support Person         Genetics    NIPS: LR  AFP:  neg                NT/IT (FT only)                     Carrier  Screen      . Twitch 12/05/2019    Past Surgical History:  Procedure Laterality Date  . WISDOM TOOTH EXTRACTION      Family History  Problem Relation Age of Onset  . Asthma Mother   . Sleep apnea Mother   . Obstructive Sleep Apnea Mother   . Diabetes Father   . Allergic rhinitis Neg Hx   . Angioedema Neg Hx   . Atopy Neg  Hx   . Eczema Neg Hx   . Immunodeficiency Neg Hx   . Urticaria Neg Hx     Social History   Tobacco Use  . Smoking status: Never  . Smokeless tobacco: Never  Vaping Use  . Vaping status: Never Used  Substance Use Topics  . Alcohol use: Not Currently  . Drug use: Not Currently    Types: Marijuana    Comment: last use feb 2024    Allergies:  Allergies  Allergen Reactions  . Cinnamon     spices  . Penicillins Other (See Comments)    unk  . Citrus Rash    Medications Prior to Admission  Medication Sig Dispense Refill Last Dose  . ondansetron (ZOFRAN) 8 MG tablet Take 1 tablet (8 mg total) by mouth every 8 (eight) hours as needed for nausea or vomiting. 30 tablet 1 07/30/2023  . Prenatal Vit-Fe Fumarate-FA (PRENATAL VITAMIN PO) Take 1 tablet by mouth daily.   07/30/2023  . Blood Pressure Monitoring (BLOOD PRESSURE KIT) DEVI 1 Device by Does not apply route once a week. 1 each 0   . budesonide-formoterol (SYMBICORT) 80-4.5 MCG/ACT inhaler Inhale 2 puffs into the lungs every 6 (six) hours. (Patient not taking: Reported on 07/19/2023) 1 each 12   . budesonide-formoterol (SYMBICORT) 80-4.5 MCG/ACT inhaler Inhale 2 puffs into the lungs every 6 (six) hours. (Patient not taking: Reported on 07/19/2023) 1 each 12   . budesonide-formoterol (SYMBICORT) 80-4.5 MCG/ACT inhaler Inhale 2 puffs into the lungs every 6 (six) hours. (Patient not taking: Reported on 07/19/2023) 1 each 12   . fluticasone (FLONASE) 50 MCG/ACT nasal spray Place 1 spray into both nostrils 2 (two) times daily. Place 1 spray into both nostrils twice daily (Patient not taking: Reported on  07/19/2023) 16 g 0   . levalbuterol (XOPENEX) 0.63 MG/3ML nebulizer solution Take 3 mLs (0.63 mg total) by nebulization once for 1 dose. 3 mL 12   . neomycin-polymyxin-hydrocortisone (CORTISPORIN) OTIC solution Place 3 drops into the right ear 3 (three) times daily. (Patient not taking: Reported on 07/19/2023) 10 mL 0   . pyridOXINE (VITAMIN B6) 25 MG tablet Take 25 mg by mouth daily. (Patient not taking: Reported on 07/19/2023)       Review of Systems  Eyes: Negative.   Gastrointestinal:  Positive for abdominal pain (RUQ pain), nausea and vomiting.  Endocrine: Negative.   Genitourinary:  Positive for pelvic pain (UC's all day).       "Different and no FM"  Musculoskeletal: Negative.   Allergic/Immunologic: Negative.   Neurological:  Positive for headaches ("Slight").  Hematological: Negative.   Psychiatric/Behavioral: Negative.     Physical Exam   Blood pressure 125/71, pulse 81, temperature 98.6 F (37 C), temperature source Oral, resp. rate 19, height 5\' 7"  (1.702 m), weight 95.5 kg, last menstrual period 12/29/2022, SpO2 99%.  Physical Exam  MAU Course  Procedures  MDM CCUA CBC CMP P/C Ratio Zofran 8 mg ODT -- ***relieved N/V Offered Medication for H/A -- declined  Assessment and Plan  ***  Raelyn Mora, CNM 07/30/2023, 11:24 PM

## 2023-07-30 NOTE — MAU Provider Note (Signed)
History     CSN: [id number]  Arrival date and time: 07/30/23 2141   Event Date/Time   First Provider Initiated Contact with Patient 07/30/23 2245      Chief Complaint  Patient presents with   Decreased Fetal Movement   Abdominal Pain   Contractions   Emesis   Nausea   HPI Jessica Hendrix is a 22 y.o. year old G25P0000 female at [redacted]w[redacted]d weeks gestation who presents to MAU reporting "stabbing" pains in the right upper quadrant portion of her abdomen below her right breast since 0100.  She describes the pain as being constant; rated 7/10.  She reports the pain is so severe that it is caused her to have nausea and vomiting.  She reports having 5 episodes of vomiting today; she took Zofran at 1000.  She also reports fetal movement has changed "a lot of rapid movements then just stops completely"; last felt movement 2 hours ago.  She reports contractions all day; rated 5/10.  She denies vaginal bleeding or loss of fluid.  She receives [facility] with [facility]; next appt is 08/02/2023. Her partner is present and contributing to the history taking.   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   GAD (generalized anxiety disorder) 03/08/2020   Supervision of other normal pregnancy, antepartum 03/13/2023              NURSING     PROVIDER      Office Location    [facility]    Dating by    LMP c/w U/S at 7 wks      [facility] Model    Traditional    Anatomy U/S    incomplete      Initiated care at     [facility]     English                     LAB RESULTS       Support Person         Genetics    NIPS: LR  AFP:  neg                NT/IT (FT only)                     Carrier Screen        Twitch 12/05/2019    Past Surgical History:  Procedure Laterality Date   WISDOM TOOTH EXTRACTION      Family History  Problem Relation Age of Onset   Asthma Mother    Sleep apnea Mother    Obstructive Sleep Apnea Mother    Diabetes Father    Allergic rhinitis Neg Hx    Angioedema Neg Hx    Atopy Neg  Hx    Eczema Neg Hx    Immunodeficiency Neg Hx    Urticaria Neg Hx     Social History   Tobacco Use   Smoking status: Never   Smokeless tobacco: Never  Vaping Use   Vaping status: Never Used  Substance Use Topics   Alcohol use: Not Currently   Drug use: Not Currently    Types: Marijuana    Comment: last use feb 2024    Allergies:  Allergies  Allergen Reactions   Cinnamon     spices   Penicillins Other (See Comments)    unk   Citrus Rash    Medications Prior to Admission  Medication Sig Dispense Refill Last Dose   ondansetron (ZOFRAN) 8 MG tablet Take 1 tablet (8 mg total) by mouth every 8 (eight) hours as needed for nausea or vomiting. 30 tablet 1 07/30/2023   Prenatal Vit-Fe Fumarate-FA (PRENATAL VITAMIN PO) Take 1 tablet by mouth daily.   07/30/2023   Blood Pressure Monitoring (BLOOD PRESSURE KIT) DEVI 1 Device by Does not apply route once a week. 1 each 0    budesonide-formoterol (SYMBICORT) 80-4.5 MCG/ACT inhaler Inhale 2 puffs into the lungs every 6 (six) hours. (Patient not taking: Reported on 07/19/2023) 1 each 12    budesonide-formoterol (SYMBICORT) 80-4.5 MCG/ACT inhaler Inhale 2 puffs into the lungs every 6 (six) hours. (Patient not taking: Reported on 07/19/2023) 1 each 12    budesonide-formoterol (SYMBICORT) 80-4.5 MCG/ACT inhaler Inhale 2 puffs into the lungs every 6 (six) hours. (Patient not taking: Reported on 07/19/2023) 1 each 12    fluticasone (FLONASE) 50 MCG/ACT nasal spray Place 1 spray into both nostrils 2 (two) times daily. Place 1 spray into both nostrils twice daily (Patient not taking: Reported on 07/19/2023) 16 g 0    levalbuterol (XOPENEX)  0.63 MG/3ML nebulizer solution Take 3 mLs (0.63 mg total) by nebulization once for 1 dose. 3 mL 12    neomycin-polymyxin-hydrocortisone (CORTISPORIN) OTIC solution Place 3 drops into the right ear 3 (three) times daily. (Patient not taking: Reported on 07/19/2023) 10 mL 0    pyridOXINE (VITAMIN B6) 25 MG tablet Take 25 mg by mouth daily. (Patient not taking: Reported on 07/19/2023)       Review of Systems  Eyes: Negative.   Gastrointestinal:  Positive for abdominal pain (RUQ pain), nausea and vomiting.  Endocrine: Negative.   Genitourinary:  Positive for pelvic pain (UC's all day).       "Different and no FM"  Musculoskeletal: Negative.   Allergic/Immunologic: Negative.   Neurological:  Positive for headaches ("Slight").  Hematological: Negative.   Psychiatric/Behavioral: Negative.     Physical Exam   Blood pressure 125/71, pulse 81, temperature 98.6 F (37 C), temperature source Oral, resp. rate 19, height 5\' 7"  (1.702 m), weight 95.5 kg, last menstrual period 12/29/2022, SpO2 99%.  Physical Exam Vitals and nursing note reviewed.  Constitutional:      Appearance: Normal appearance. She is normal weight.  HENT:     Head: Normocephalic and atraumatic.  Cardiovascular:     Rate and Rhythm: Normal rate.  Pulmonary:     Effort: Pulmonary effort is normal.     Breath sounds: Normal breath sounds.  Abdominal:     General: Bowel sounds are normal.     Palpations: Abdomen is soft.     Tenderness: There is abdominal tenderness (RUQ tenderness with palpation, reports increased when releasing pressure).  Genitourinary:    Comments: Deferred - no UC's Musculoskeletal:  General: Normal range of motion.  Skin:    General: Skin is warm and dry.  Neurological:     Mental Status: She is alert and oriented to person, place, and time.  Psychiatric:        Mood and Affect: Mood normal.        Behavior: Behavior normal.        Thought Content: Thought content normal.         Judgment: Judgment normal.    REACTIVE NST - FHR: 140 bpm / moderate variability / accels present / decels absent / TOCO: none noted MAU Course  Procedures  MDM CCUA CBC CMP P/C Ratio Zofran 8 mg ODT -- relieved N/V Offered Excedrin Tension H/A -- declined Offered Flexeril -- declined  Results for orders placed or performed during the hospital encounter of 07/30/23 (from the past 24 hour(s))  Urinalysis, Routine w reflex microscopic -Urine, Clean Catch     Status: None   Collection Time: 07/30/23 10:10 PM  Result Value Ref Range   Color, Urine YELLOW YELLOW   APPearance CLEAR CLEAR   Specific Gravity, Urine 1.025 1.005 - 1.030   pH 6.0 5.0 - 8.0   Glucose, UA NEGATIVE NEGATIVE mg/dL   Hgb urine dipstick NEGATIVE NEGATIVE   Bilirubin Urine NEGATIVE NEGATIVE   Ketones, ur NEGATIVE NEGATIVE mg/dL   Protein, ur NEGATIVE NEGATIVE mg/dL   Nitrite NEGATIVE NEGATIVE   Leukocytes,Ua NEGATIVE NEGATIVE  Protein / creatinine ratio, urine     Status: Abnormal   Collection Time: 07/30/23 10:10 PM  Result Value Ref Range   Creatinine, Urine 188 mg/dL   Total Protein, Urine 33 mg/dL   Protein Creatinine Ratio 0.18 (H) 0.00 - 0.15 mg/mg[Cre]  CBC     Status: Abnormal   Collection Time: 07/30/23 11:22 PM  Result Value Ref Range   WBC 12.3 (H) 4.0 - 10.5 K/uL   RBC 3.97 3.87 - 5.11 MIL/uL   Hemoglobin 10.9 (L) 12.0 - 15.0 g/dL   HCT 16.1 (L) 09.6 - 04.5 %   MCV 84.1 80.0 - 100.0 fL   MCH 27.5 26.0 - 34.0 pg   MCHC 32.6 30.0 - 36.0 g/dL   RDW 40.9 81.1 - 91.4 %   Platelets 123 (L) 150 - 400 K/uL   nRBC 0.0 0.0 - 0.2 %  Comprehensive metabolic panel     Status: Abnormal   Collection Time: 07/30/23 11:22 PM  Result Value Ref Range   Sodium 135 135 - 145 mmol/L   Potassium 3.8 3.5 - 5.1 mmol/L   Chloride 106 98 - 111 mmol/L   CO2 22 22 - 32 mmol/L   Glucose, Bld 87 70 - 99 mg/dL   BUN 10 6 - 20 mg/dL   Creatinine, Ser 7.82 0.44 - 1.00 mg/dL   Calcium 9.1 8.9 - 95.6 mg/dL   Total  Protein 6.1 (L) 6.5 - 8.1 g/dL   Albumin 2.6 (L) 3.5 - 5.0 g/dL   AST 15 15 - 41 U/L   ALT 17 0 - 44 U/L   Alkaline Phosphatase 97 38 - 126 U/L   Total Bilirubin 0.5 0.3 - 1.2 mg/dL   GFR, Estimated >21 >30 mL/min   Anion gap 7 5 - 15    Assessment and Plan  1. Abdominal pain during pregnancy in third trimester - Information provided on abdominal pain in pregnancy   2. Nausea and vomiting of pregnancy, antepartum - Information provided on morning sickness - Refill prescription for: Zofran 8  mg ODT every 8 hrs prn N/V  3. [redacted] weeks gestation of pregnancy   - Discharge patient - Keep scheduled appt with [facility] on 08/02/2023 - Patient verbalized an understanding of the plan of care and agrees.    Raelyn Mora, CNM 07/30/2023, 11:24 PM

## 2023-07-31 ENCOUNTER — Telehealth: Payer: Self-pay | Admitting: *Deleted

## 2023-07-31 ENCOUNTER — Other Ambulatory Visit: Payer: Self-pay | Admitting: *Deleted

## 2023-07-31 DIAGNOSIS — O219 Vomiting of pregnancy, unspecified: Secondary | ICD-10-CM

## 2023-07-31 DIAGNOSIS — R109 Unspecified abdominal pain: Secondary | ICD-10-CM | POA: Diagnosis not present

## 2023-07-31 DIAGNOSIS — Z3A30 30 weeks gestation of pregnancy: Secondary | ICD-10-CM | POA: Diagnosis not present

## 2023-07-31 DIAGNOSIS — O26893 Other specified pregnancy related conditions, third trimester: Secondary | ICD-10-CM

## 2023-07-31 MED ORDER — ONDANSETRON 8 MG PO TBDP
8.0000 mg | ORAL_TABLET | Freq: Three times a day (TID) | ORAL | 0 refills | Status: DC | PRN
Start: 2023-07-31 — End: 2023-07-31

## 2023-07-31 MED ORDER — ONDANSETRON HCL 4 MG PO TABS: 8.0000 mg | ORAL_TABLET | Freq: Three times a day (TID) | ORAL | 0 refills | Status: DC | PRN

## 2023-07-31 NOTE — Discharge Instructions (Signed)
Apply heat to affected area on abdomen for 10-15 minutes at a time. Do NOT apply heat directly to front of or majority of belly to prevent overheating of baby.

## 2023-07-31 NOTE — Telephone Encounter (Signed)
TC from pt requesting tablet form of Zofran not ODT form. RX changed. Same strength and number of doses.

## 2023-08-02 ENCOUNTER — Inpatient Hospital Stay (HOSPITAL_COMMUNITY)
Admission: AD | Admit: 2023-08-02 | Discharge: 2023-08-02 | Disposition: A | Discharge status: Home / Self Care | Payer: 59 | Attending: Obstetrics & Gynecology | Admitting: Obstetrics & Gynecology

## 2023-08-02 ENCOUNTER — Inpatient Hospital Stay (HOSPITAL_COMMUNITY): Payer: 59

## 2023-08-02 ENCOUNTER — Encounter (HOSPITAL_COMMUNITY): Payer: Self-pay | Admitting: Obstetrics & Gynecology

## 2023-08-02 DIAGNOSIS — O99213 Obesity complicating pregnancy, third trimester: Secondary | ICD-10-CM | POA: Diagnosis not present

## 2023-08-02 DIAGNOSIS — E669 Obesity, unspecified: Secondary | ICD-10-CM

## 2023-08-02 DIAGNOSIS — O26893 Other specified pregnancy related conditions, third trimester: Secondary | ICD-10-CM | POA: Insufficient documentation

## 2023-08-02 DIAGNOSIS — J45909 Unspecified asthma, uncomplicated: Secondary | ICD-10-CM

## 2023-08-02 DIAGNOSIS — Z3A30 30 weeks gestation of pregnancy: Secondary | ICD-10-CM

## 2023-08-02 DIAGNOSIS — O368130 Decreased fetal movements, third trimester, not applicable or unspecified: Secondary | ICD-10-CM | POA: Diagnosis not present

## 2023-08-02 DIAGNOSIS — O99513 Diseases of the respiratory system complicating pregnancy, third trimester: Secondary | ICD-10-CM | POA: Diagnosis not present

## 2023-08-02 DIAGNOSIS — O4703 False labor before 37 completed weeks of gestation, third trimester: Secondary | ICD-10-CM | POA: Diagnosis not present

## 2023-08-02 DIAGNOSIS — R1030 Lower abdominal pain, unspecified: Secondary | ICD-10-CM | POA: Diagnosis not present

## 2023-08-02 HISTORY — DX: Urinary tract infection, site not specified: N39.0

## 2023-08-02 HISTORY — DX: Tubulo-interstitial nephritis, not specified as acute or chronic: N12

## 2023-08-02 HISTORY — DX: Unspecified asthma, uncomplicated: J45.909

## 2023-08-02 LAB — URINALYSIS, ROUTINE W REFLEX MICROSCOPIC
Bilirubin Urine: NEGATIVE
Glucose, UA: NEGATIVE mg/dL
Hgb urine dipstick: NEGATIVE
Ketones, ur: NEGATIVE mg/dL
Leukocytes,Ua: NEGATIVE
Nitrite: NEGATIVE
Protein, ur: NEGATIVE mg/dL
Specific Gravity, Urine: 1.01 (ref 1.005–1.030)
pH: 6 (ref 5.0–8.0)

## 2023-08-02 LAB — WET PREP, GENITAL
Clue Cells Wet Prep HPF POC: NONE SEEN
Sperm: NONE SEEN
Trich, Wet Prep: NONE SEEN
WBC, Wet Prep HPF POC: <10 (ref <10)
Yeast Wet Prep HPF POC: NONE SEEN

## 2023-08-02 MED ORDER — NIFEDIPINE 10 MG PO CAPS
10.0000 mg | ORAL_CAPSULE | ORAL | Status: DC | PRN
  Administered 2023-08-02: 10 mg via ORAL

## 2023-08-02 MED ORDER — NIFEDIPINE 10 MG PO CAPS: 10.0000 mg | ORAL_CAPSULE | ORAL | 0 refills | Status: DC | PRN

## 2023-08-02 MED ORDER — NIFEDIPINE 10 MG PO CAPS
10.0000 mg | ORAL_CAPSULE | ORAL | Status: DC | PRN
  Administered 2023-08-02 (×2): 10 mg via ORAL
  Filled 2023-08-02 (×3): qty 1

## 2023-08-02 NOTE — MAU Note (Signed)
.  Jessica Hendrix is a 22 y.o. at [redacted]w[redacted]d here in MAU reporting: contractions for a while, became more consistent in the last 2 hours. Denies bleeding or ROM. Reports decreased fetal movement today. Reports positive dysuria  Onset of complaint: 2 days Pain score: 8/10 contractions                    8/10 pressure Vitals:   08/02/23 1502  BP: 128/70  Pulse: 83  Resp: 17  Temp: 98.4 F (36.9 C)  SpO2: 99%     FHT:137 Lab orders placed from triage:  ua

## 2023-08-02 NOTE — MAU Provider Note (Signed)
History     CSN: [id number]  Arrival date and time: 08/02/23 1441   None     Chief Complaint  Patient presents with   Contractions   Decreased Fetal Movement   Reports contractions "for the past few hours" that have been about 6 minutes apart and lasting from 50-90 seconds. Sharp pain in lower abdomen, reports this sharp in more consistent. Reports last intercourse was this morning. Denies vaginal bleeding, LOF or discharge.    OB History     Gravida  1   Para  0   Term  0   Preterm  0   AB  0   Living  0      SAB  0   IAB  0   Ectopic  0   Multiple  0   Live Births  0           Past Medical History:  Diagnosis Date   Allergy    Anemia    Anxiety    Asthma    Constipation    Depression    Depression, major, single episode, moderate (HCC) 12/05/2019   GAD (generalized anxiety disorder) 03/08/2020   Pyelonephritis    Supervision of other normal pregnancy, antepartum 03/13/2023              NURSING     PROVIDER      Office Location    [facility]    Dating by    LMP c/w U/S at 7 wks      [facility] Model    Traditional    Anatomy U/S    incomplete      Initiated care at     [facility]     English                     LAB RESULTS       Support Person         Genetics    NIPS: LR  AFP:  neg                NT/IT (FT only)                     Carrier Screen       Twitch 12/05/2019   UTI (urinary tract infection)     Past Surgical History:  Procedure Laterality Date   WISDOM TOOTH EXTRACTION      Family History  Problem Relation Age of Onset   Asthma Mother    Sleep apnea Mother    Obstructive Sleep Apnea Mother    Diabetes Father    Allergic rhinitis Neg Hx    Angioedema Neg Hx    Atopy Neg Hx    Eczema Neg Hx    Immunodeficiency Neg Hx    Urticaria Neg Hx     Social History   Tobacco Use   Smoking status: Never   Smokeless tobacco: Never  Vaping Use   Vaping status: Never Used  Substance Use Topics   Alcohol use:  Not Currently   Drug use: Not Currently    Types: Marijuana    Comment: last use feb 2024    Allergies:  Allergies  Allergen Reactions   Cinnamon     spices   Penicillins Other (See Comments)    unk  Citrus Rash    Medications Prior to Admission  Medication Sig Dispense Refill Last Dose   ondansetron (ZOFRAN) 4 MG tablet Take 2 tablets (8 mg total) by mouth every 8 (eight) hours as needed for nausea or vomiting. 180 tablet 0 08/01/2023   Prenatal Vit-Fe Fumarate-FA (PRENATAL VITAMIN PO) Take 1 tablet by mouth daily.   08/02/2023   Blood Pressure Monitoring (BLOOD PRESSURE KIT) DEVI 1 Device by Does not apply route once a week. 1 each 0    budesonide-formoterol (SYMBICORT) 80-4.5 MCG/ACT inhaler Inhale 2 puffs into the lungs every 6 (six) hours. (Patient not taking: Reported on 07/19/2023) 1 each 12 More than a month   budesonide-formoterol (SYMBICORT) 80-4.5 MCG/ACT inhaler Inhale 2 puffs into the lungs every 6 (six) hours. (Patient not taking: Reported on 07/19/2023) 1 each 12    budesonide-formoterol (SYMBICORT) 80-4.5 MCG/ACT inhaler Inhale 2 puffs into the lungs every 6 (six) hours. (Patient not taking: Reported on 07/19/2023) 1 each 12    fluticasone (FLONASE) 50 MCG/ACT nasal spray Place 1 spray into both nostrils 2 (two) times daily. Place 1 spray into both nostrils twice daily (Patient not taking: Reported on 07/19/2023) 16 g 0 More than a month   levalbuterol (XOPENEX) 0.63 MG/3ML nebulizer solution Take 3 mLs (0.63 mg total) by nebulization once for 1 dose. 3 mL 12    neomycin-polymyxin-hydrocortisone (CORTISPORIN) OTIC solution Place 3 drops into the right ear 3 (three) times daily. (Patient not taking: Reported on 07/19/2023) 10 mL 0    pyridOXINE (VITAMIN B6) 25 MG tablet Take 25 mg by mouth daily. (Patient not taking: Reported on 07/19/2023)       Review of Systems  Constitutional:  Negative for chills, fatigue, fever and unexpected weight change.  Respiratory:  Negative  for cough and shortness of breath.   Cardiovascular:  Negative for chest pain and palpitations.  Gastrointestinal:  Negative for abdominal pain, constipation, diarrhea, nausea and vomiting.  Genitourinary:  Positive for pelvic pain. Negative for difficulty urinating, flank pain, frequency and urgency.  Neurological:  Negative for dizziness and headaches.  Psychiatric/Behavioral:  Negative for confusion and suicidal ideas.    Physical Exam   Blood pressure 128/70, pulse 83, temperature 98.4 F (36.9 C), resp. rate 17, height 5\' 7"  (1.702 m), weight 94.2 kg, last menstrual period 12/29/2022, SpO2 99%.  Physical Exam Vitals reviewed. Exam conducted with a chaperone present.  Constitutional:      Appearance: Normal appearance.  HENT:     Head: Normocephalic.  Pulmonary:     Effort: Pulmonary effort is normal.  Genitourinary:    Cervix: Dilated.     Comments: 0.5  Skin:    General: Skin is warm and dry.     Capillary Refill: Capillary refill takes less than 2 seconds.  Neurological:     Mental Status: She is alert and oriented to person, place, and time.  Psychiatric:        Mood and Affect: Mood normal.        Behavior: Behavior normal.        Thought Content: Thought content normal.        Judgment: Judgment normal.    Fetal Assessment 140 bpm, Mod Var, -Decels, +Accels Toco: 4-6, mild per palpation   MAU Course   Results for orders placed or performed during the hospital encounter of 08/02/23 (from the past 24 hour(s))  Urinalysis, Routine w reflex microscopic -Urine, Clean Catch     Status: None   Collection Time: 08/02/23  3:55 PM  Result Value Ref Range   Color, Urine YELLOW YELLOW   APPearance CLEAR CLEAR   Specific Gravity, Urine 1.010 1.005 - 1.030   pH 6.0 5.0 - 8.0   Glucose, UA NEGATIVE NEGATIVE mg/dL   Hgb urine dipstick NEGATIVE NEGATIVE   Bilirubin Urine NEGATIVE NEGATIVE   Ketones, ur NEGATIVE NEGATIVE mg/dL   Protein, ur NEGATIVE NEGATIVE mg/dL    Nitrite NEGATIVE NEGATIVE   Leukocytes,Ua NEGATIVE NEGATIVE  Wet prep, genital     Status: None   Collection Time: 08/02/23  4:10 PM   Specimen: Vaginal  Result Value Ref Range   Yeast Wet Prep HPF POC NONE SEEN NONE SEEN   Trich, Wet Prep NONE SEEN NONE SEEN   Clue Cells Wet Prep HPF POC NONE SEEN NONE SEEN   WBC, Wet Prep HPF POC <10 <10   Sperm NONE SEEN    No results found.  MDM PE Labs:wet prep, GC, UA EFM  Assessment and Plan  22yo  G1P0  SIUP at 30.6 weeks Cat 1 FT  - Exam findings discussed. -  Routine precautions counseled.  - Contractions ceased s/p procardia, sent with prescription for home PRN. - May return to MAU as needed. - Discharged home in stable condition.    Richardson Landry MSN, CNM 08/02/2023, 4:57 PM

## 2023-08-03 ENCOUNTER — Telehealth: Payer: Self-pay

## 2023-08-03 ENCOUNTER — Ambulatory Visit: Payer: 59 | Admitting: Physical Therapy

## 2023-08-03 LAB — GC/CHLAMYDIA PROBE AMP (~~LOC~~) NOT AT ARMC
Chlamydia: NEGATIVE
Comment: NEGATIVE
Comment: NORMAL
Neisseria Gonorrhea: NEGATIVE

## 2023-08-03 NOTE — Telephone Encounter (Signed)
Called patient to follow up on a call to after hours triage line stating that she was prescribed procardia yesterday at MAU and she is dizzy with a headache, and weakness. Tylenol not helping the headache. Was told to go to the hospital, but has not been seen.  No answer. Left vm for patient to return call

## 2023-08-10 ENCOUNTER — Inpatient Hospital Stay (HOSPITAL_COMMUNITY)
Admission: AD | Admit: 2023-08-10 | Discharge: 2023-08-11 | Disposition: A | Discharge status: Home / Self Care | Payer: 59 | Attending: Obstetrics and Gynecology | Admitting: Obstetrics and Gynecology

## 2023-08-10 ENCOUNTER — Inpatient Hospital Stay (HOSPITAL_COMMUNITY): Payer: 59

## 2023-08-10 ENCOUNTER — Other Ambulatory Visit: Payer: Self-pay

## 2023-08-10 ENCOUNTER — Ambulatory Visit: Payer: 59

## 2023-08-10 ENCOUNTER — Encounter (HOSPITAL_COMMUNITY): Payer: Self-pay | Admitting: Obstetrics and Gynecology

## 2023-08-10 DIAGNOSIS — R109 Unspecified abdominal pain: Secondary | ICD-10-CM | POA: Diagnosis not present

## 2023-08-10 DIAGNOSIS — O4703 False labor before 37 completed weeks of gestation, third trimester: Secondary | ICD-10-CM

## 2023-08-10 DIAGNOSIS — E669 Obesity, unspecified: Secondary | ICD-10-CM | POA: Diagnosis not present

## 2023-08-10 DIAGNOSIS — O99212 Obesity complicating pregnancy, second trimester: Secondary | ICD-10-CM

## 2023-08-10 DIAGNOSIS — R311 Benign essential microscopic hematuria: Secondary | ICD-10-CM | POA: Diagnosis not present

## 2023-08-10 DIAGNOSIS — Z3A32 32 weeks gestation of pregnancy: Secondary | ICD-10-CM

## 2023-08-10 DIAGNOSIS — O99513 Diseases of the respiratory system complicating pregnancy, third trimester: Secondary | ICD-10-CM

## 2023-08-10 DIAGNOSIS — O26893 Other specified pregnancy related conditions, third trimester: Secondary | ICD-10-CM | POA: Diagnosis not present

## 2023-08-10 DIAGNOSIS — O99213 Obesity complicating pregnancy, third trimester: Secondary | ICD-10-CM

## 2023-08-10 DIAGNOSIS — N858 Other specified noninflammatory disorders of uterus: Secondary | ICD-10-CM | POA: Insufficient documentation

## 2023-08-10 DIAGNOSIS — J45909 Unspecified asthma, uncomplicated: Secondary | ICD-10-CM

## 2023-08-10 LAB — URINALYSIS, ROUTINE W REFLEX MICROSCOPIC
Bilirubin Urine: NEGATIVE
Glucose, UA: NEGATIVE mg/dL
Ketones, ur: NEGATIVE mg/dL
Leukocytes,Ua: NEGATIVE
Nitrite: NEGATIVE
Protein, ur: 30 mg/dL — AB
RBC / HPF: >50 RBC/hpf (ref 0–5)
Specific Gravity, Urine: 1.024 (ref 1.005–1.030)
pH: 5 (ref 5.0–8.0)

## 2023-08-10 LAB — WET PREP, GENITAL
Clue Cells Wet Prep HPF POC: NONE SEEN
Sperm: NONE SEEN
Trich, Wet Prep: NONE SEEN
WBC, Wet Prep HPF POC: >=10 — AB (ref <10)
Yeast Wet Prep HPF POC: NONE SEEN

## 2023-08-10 MED ORDER — NIFEDIPINE 10 MG PO CAPS
10.0000 mg | ORAL_CAPSULE | ORAL | Status: DC | PRN
Start: 2023-08-10 — End: 2023-08-10

## 2023-08-10 NOTE — MAU Note (Signed)
RN attempted to call [facility] Radiology twice but there was no answer either time. This RN left a voicemail.

## 2023-08-10 NOTE — MAU Note (Addendum)
.  Jessica Hendrix is a 22 y.o. at [redacted]w[redacted]d here in MAU reporting: lower abd pain contractions into her lower back and sharp vaginal pain and pressure that is constantly there. Patient reports was seen for same thing last week in MAU on Wednesday, was checked and was 1 cm dilated and was given medication to stop the contractions and was sent home. Patient reports pain increased in frequency and intensity yesterday. Patient unable to give a time of frequency for contractions  Patient reports had an OB US today, where she had the pain but reports coming in to MAU at this time because the pain increased in intensity.    Denies VB, LOF, +FM   Onset of complaint: Yesterday  Pain score: 8/10- lower abd contractions, back  There were no vitals filed for this visit.   WUJ:[id number] straight back to room  Lab orders placed from triage:  UA

## 2023-08-10 NOTE — MAU Provider Note (Cosign Needed Addendum)
History  Pt is a 22 yo G1 at [redacted]w[redacted]d who presents to MAU with contractions. She reports that she went to [facility] appointment and went shopping today and was unable to drink a lot of water. She did not have intercourse today. Denies LOF, vaginal bleeding. Positive fetal movement. She was seen last week in MAU for contractions and says these are a little stronger. Denies any other symptoms. Assessment and plan of care was discussed with Dr Leanora Cover.   Chief Complaint  Patient presents with   Contractions   Pelvic Pain   pelvic pressure   Pelvic Pain The patient's primary symptoms include pelvic pain. The patient's pertinent negatives include no genital itching, genital odor or vaginal bleeding. The current episode started today. The problem has been waxing and waning. The pain is mild. She is pregnant. Associated symptoms include abdominal pain. Pertinent negatives include no constipation, diarrhea, fever, frequency or hematuria. Nothing aggravates the symptoms. She has tried nothing for the symptoms.    OB History     Gravida  1   Para  0   Term  0   Preterm  0   AB  0   Living  0      SAB  0   IAB  0   Ectopic  0   Multiple  0   Live Births  0           Past Medical History:  Diagnosis Date   Allergy    Anemia    Anxiety    Asthma    Constipation    Depression    Depression, major, single episode, moderate (HCC) 12/05/2019   GAD (generalized anxiety disorder) 03/08/2020   Pyelonephritis    Supervision of other normal pregnancy, antepartum 03/13/2023              NURSING     PROVIDER      Office Location    [facility]    Dating by    LMP c/w U/S at 7 wks      [facility] Model    Traditional    Anatomy U/S    incomplete      Initiated care at     [facility] [facility]     English                     LAB RESULTS       Support Person         Genetics    NIPS: LR  AFP:  neg                NT/IT (FT only)                     Carrier Screen       Twitch 12/05/2019    UTI (urinary tract infection)     Past Surgical History:  Procedure Laterality Date   WISDOM TOOTH EXTRACTION      Family History  Problem Relation Age of Onset   Asthma Mother    Sleep apnea Mother    Obstructive Sleep Apnea Mother    Diabetes Father    Allergic rhinitis Neg Hx    Angioedema Neg Hx    Atopy Neg Hx    Eczema Neg Hx    Immunodeficiency Neg Hx  Urticaria Neg Hx     Social History   Tobacco Use   Smoking status: Never   Smokeless tobacco: Never  Vaping Use   Vaping status: Never Used  Substance Use Topics   Alcohol use: Not Currently   Drug use: Not Currently    Types: Marijuana    Comment: last use feb 2024    Allergies:  Allergies  Allergen Reactions   Cinnamon     spices   Penicillins Other (See Comments)    unk   Citrus Rash    Medications Prior to Admission  Medication Sig Dispense Refill Last Dose   Prenatal Vit-Fe Fumarate-FA (PRENATAL VITAMIN PO) Take 1 tablet by mouth daily.   08/09/2023   Blood Pressure Monitoring (BLOOD PRESSURE KIT) DEVI 1 Device by Does not apply route once a week. 1 each 0    budesonide-formoterol (SYMBICORT) 80-4.5 MCG/ACT inhaler Inhale 2 puffs into the lungs every 6 (six) hours. (Patient not taking: Reported on 07/19/2023) 1 each 12    budesonide-formoterol (SYMBICORT) 80-4.5 MCG/ACT inhaler Inhale 2 puffs into the lungs every 6 (six) hours. (Patient not taking: Reported on 07/19/2023) 1 each 12    budesonide-formoterol (SYMBICORT) 80-4.5 MCG/ACT inhaler Inhale 2 puffs into the lungs every 6 (six) hours. (Patient not taking: Reported on 07/19/2023) 1 each 12    fluticasone (FLONASE) 50 MCG/ACT nasal spray Place 1 spray into both nostrils 2 (two) times daily. Place 1 spray into both nostrils twice daily (Patient not taking: Reported on 07/19/2023) 16 g 0    levalbuterol (XOPENEX) 0.63 MG/3ML nebulizer solution Take 3 mLs (0.63 mg total) by nebulization once for 1 dose. 3 mL 12    neomycin-polymyxin-hydrocortisone  (CORTISPORIN) OTIC solution Place 3 drops into the right ear 3 (three) times daily. (Patient not taking: Reported on 07/19/2023) 10 mL 0    NIFEdipine (PROCARDIA) 10 MG capsule Take 1 capsule (10 mg total) by mouth every 20 (twenty) minutes as needed (contractions). 3 capsule 0    ondansetron (ZOFRAN) 4 MG tablet Take 2 tablets (8 mg total) by mouth every 8 (eight) hours as needed for nausea or vomiting. 180 tablet 0    pyridOXINE (VITAMIN B6) 25 MG tablet Take 25 mg by mouth daily. (Patient not taking: Reported on 07/19/2023)       Review of Systems  Constitutional:  Negative for fever.  Gastrointestinal:  Positive for abdominal pain. Negative for constipation and diarrhea.  Genitourinary:  Positive for pelvic pain. Negative for frequency and hematuria.   Physical Exam Blood pressure 121/72, pulse 100, temperature 98.5 F (36.9 C), temperature source Oral, resp. rate (!) 22, height 5\' 7"  (1.702 m), weight 96.4 kg, last menstrual period 12/29/2022, SpO2 100%. Physical Exam Gen: well appearing, in no acute distress Pulm: normal WOB Pelvic: white mucousy discharge, no bleeding, cervix closed and thick  MAU Course Procedures  MDM #Contractions Patient's contractions are likely due to dehydration. Wet prep WNL, GC/Cl pending. Urinalysis with Hgb and protein. Ordered renal US to rule out kidney stone. Encouraged PO hydration. If [place] is normal, can discharge tonight.  Assumed care.  Assesssment by me is consistent with above Results for orders placed or performed during the hospital encounter of 08/10/23 (from the past 24 hour(s))  Urinalysis, Routine w reflex microscopic -Urine, Clean Catch     Status: Abnormal   Collection Time: 08/10/23  7:22 PM  Result Value Ref Range   Color, Urine YELLOW YELLOW   APPearance HAZY (A) CLEAR   Specific Gravity,  Urine 1.024 1.005 - 1.030   pH 5.0 5.0 - 8.0   Glucose, UA NEGATIVE NEGATIVE mg/dL   Hgb urine dipstick LARGE (A) NEGATIVE   Bilirubin Urine  NEGATIVE NEGATIVE   Ketones, ur NEGATIVE NEGATIVE mg/dL   Protein, ur 30 (A) NEGATIVE mg/dL   Nitrite NEGATIVE NEGATIVE   Leukocytes,Ua NEGATIVE NEGATIVE   RBC / HPF >50 0 - 5 RBC/hpf   WBC, UA 0-5 0 - 5 WBC/hpf   Bacteria, UA RARE (A) NONE SEEN   Squamous Epithelial / HPF 0-5 0 - 5 /HPF   Mucus PRESENT   Wet prep, genital     Status: Abnormal   Collection Time: 08/10/23  7:44 PM   Specimen: PATH Cytology Cervicovaginal Ancillary Only  Result Value Ref Range   Yeast Wet Prep HPF POC NONE SEEN NONE SEEN   Trich, Wet Prep NONE SEEN NONE SEEN   Clue Cells Wet Prep HPF POC NONE SEEN NONE SEEN   WBC, Wet Prep HPF POC >=10 (A) <10   Sperm NONE SEEN    US RENAL  Result Date: 08/10/2023 CLINICAL DATA:  [id number] Abdominal pain [id number] EXAM: RENAL / URINARY TRACT ULTRASOUND COMPLETE COMPARISON:  None Available. FINDINGS: Right Kidney: Renal measurements: 10.2 x 5.5 x 5.3 cm = volume: 155 mL. Echogenicity within normal limits. No mass or hydronephrosis visualized. Left Kidney: Renal measurements: 10.9 x 5.7 x 5.8 cm = volume: 191.80 mL. Echogenicity within normal limits. No mass or hydronephrosis visualized. Urinary bladder: Appears normal for degree of bladder distention. Other: None. IMPRESSION: Unremarkable renal ultrasound. Electronically Signed   By: Tish Frederickson M.D.   On: 08/10/2023 23:24     FHR reactive Baseline variability normal  UCs irregular and mild   Discussed treatment of Preterm contractions including Procardia series and Terbutaline Patient declines both treatment, does not like side effects Discussed risk of not stopping contractions, accepts risks, states they do not hurt that much now.  A:  Single IUP at [redacted]w[redacted]d       Threatened preterm labor        Microscopic hematuria without leukocytosis       RIght flank pain, improved  P:  Discharge home       Preterm labor precautions reviewed       Good hydration stressed      Followup in office     Encouraged to return if  she develops worsening of symptoms, increase in pain, fever, or other concerning symptoms.   Aviva Signs, CNM

## 2023-08-11 ENCOUNTER — Other Ambulatory Visit: Payer: Self-pay | Admitting: *Deleted

## 2023-08-11 DIAGNOSIS — O99213 Obesity complicating pregnancy, third trimester: Secondary | ICD-10-CM

## 2023-08-11 LAB — GC/CHLAMYDIA PROBE AMP (~~LOC~~) NOT AT ARMC
Chlamydia: NEGATIVE
Comment: NEGATIVE
Comment: NORMAL
Neisseria Gonorrhea: NEGATIVE

## 2023-08-12 LAB — CULTURE, OB URINE

## 2023-08-16 ENCOUNTER — Encounter: Payer: 59 | Admitting: Obstetrics and Gynecology

## 2023-08-16 ENCOUNTER — Encounter: Payer: Self-pay | Admitting: Obstetrics and Gynecology

## 2023-08-16 ENCOUNTER — Ambulatory Visit (INDEPENDENT_AMBULATORY_CARE_PROVIDER_SITE_OTHER): Payer: 59 | Admitting: Obstetrics and Gynecology

## 2023-08-16 VITALS — BP 125/76 | HR 101 | Wt 210.0 lb

## 2023-08-16 DIAGNOSIS — O99210 Obesity complicating pregnancy, unspecified trimester: Secondary | ICD-10-CM

## 2023-08-16 DIAGNOSIS — D696 Thrombocytopenia, unspecified: Secondary | ICD-10-CM

## 2023-08-16 DIAGNOSIS — Z3A32 32 weeks gestation of pregnancy: Secondary | ICD-10-CM

## 2023-08-16 DIAGNOSIS — Z3480 Encounter for supervision of other normal pregnancy, unspecified trimester: Secondary | ICD-10-CM

## 2023-08-16 DIAGNOSIS — O99113 Other diseases of the blood and blood-forming organs and certain disorders involving the immune mechanism complicating pregnancy, third trimester: Secondary | ICD-10-CM

## 2023-08-16 NOTE — Progress Notes (Signed)
   PRENATAL VISIT NOTE  Subjective:  Jessica Hendrix is a 22 y.o. G1P0000 at [redacted]w[redacted]d being seen today for ongoing prenatal care.  She is currently monitored for the following issues for this low-risk pregnancy and has Moderate persistent asthma without complication; Supervision of other normal pregnancy, antepartum; Obesity affecting pregnancy, antepartum; and Gestational thrombocytopenia, third trimester (HCC) on their problem list.  Patient reports  occasional regular contractions .  Contractions: Irritability. Vag. Bleeding: None.  Movement: Present. Denies leaking of fluid.   The following portions of the patient's history were reviewed and updated as appropriate: allergies, current medications, past family history, past medical history, past social history, past surgical history and problem list.   Objective:   Vitals:   08/16/23 1341  BP: 125/76  Pulse: (!) 101  Weight: 210 lb (95.3 kg)    Fetal Status:     Movement: Present     General:  Alert, oriented and cooperative. Patient is in no acute distress.  Skin: Skin is warm and dry. No rash noted.   Cardiovascular: Normal heart rate noted  Respiratory: Normal respiratory effort, no problems with respiration noted  Abdomen: Soft, gravid, appropriate for gestational age.  Pain/Pressure: Present     Pelvic: Cervical exam deferred        Extremities: Normal range of motion.  Edema: Trace (Feet)  Mental Status: Normal mood and affect. Normal behavior. Normal judgment and thought content.   Assessment and Plan:  Pregnancy: G1P0000 at [redacted]w[redacted]d  1. Gestational thrombocytopenia, third trimester (HCC) Recheck 36 weeks  2. Obesity affecting pregnancy, antepartum, unspecified obesity type  3. Supervision of other normal pregnancy, antepartum LMP 7 days difference with 7 week [place], will use 7 weeks [place] as dating  4. [redacted] weeks gestation of pregnancy   Preterm labor symptoms and general obstetric precautions including but not limited to  vaginal bleeding, contractions, leaking of fluid and fetal movement were reviewed in detail with the patient. Please refer to After Visit Summary for other counseling recommendations.   Return in about 2 weeks (around 08/30/2023) for low OB.  Future Appointments  Date Time Provider Department Center  08/30/2023  4:10 PM Raelyn Mora, CNM [facility] None  09/13/2023  4:10 PM Sue Lush, FNP [facility] None  09/14/2023  3:15 PM [facility] NURSE [facility]  09/14/2023  3:30 PM [facility] US2 [facility]  09/18/2023  4:10 PM Lennart Pall, MD [facility] None    Conan Bowens, MD

## 2023-08-30 ENCOUNTER — Ambulatory Visit (INDEPENDENT_AMBULATORY_CARE_PROVIDER_SITE_OTHER): Payer: 59 | Admitting: Obstetrics and Gynecology

## 2023-08-30 ENCOUNTER — Encounter: Payer: Self-pay | Admitting: Obstetrics and Gynecology

## 2023-08-30 VITALS — BP 122/73 | HR 77 | Wt 217.5 lb

## 2023-08-30 DIAGNOSIS — O99113 Other diseases of the blood and blood-forming organs and certain disorders involving the immune mechanism complicating pregnancy, third trimester: Secondary | ICD-10-CM

## 2023-08-30 DIAGNOSIS — Z3A34 34 weeks gestation of pregnancy: Secondary | ICD-10-CM

## 2023-08-30 DIAGNOSIS — Z3480 Encounter for supervision of other normal pregnancy, unspecified trimester: Secondary | ICD-10-CM

## 2023-08-30 DIAGNOSIS — D696 Thrombocytopenia, unspecified: Secondary | ICD-10-CM

## 2023-08-30 DIAGNOSIS — O99210 Obesity complicating pregnancy, unspecified trimester: Secondary | ICD-10-CM

## 2023-08-30 NOTE — Progress Notes (Signed)
Pt. Presents for rob. Has questions about her due date.

## 2023-08-30 NOTE — Patient Instructions (Signed)

## 2023-09-03 NOTE — Progress Notes (Signed)
LOW-RISK PREGNANCY OFFICE VISIT Patient name: Jessica Hendrix MRN [id number]  Date of birth: 2001-04-08 Chief Complaint:   Routine Prenatal Visit  History of Present Illness:   Jessica Hendrix is a 22 y.o. G79P0000 female at [redacted]w[redacted]d with an Estimated Date of Delivery: 10/07/23 being seen today for ongoing management of a low-risk pregnancy.  Today she reports  she has questions about her due date and how MFM "keeps changing it." She reports her LMP was actually 12/29/2022 and not as it is documented in her chart. She is concerned that she will be "made to be induced before she is actually post dates."  Contractions: Irritability. Vag. Bleeding: None.  Movement: Present. denies leaking of fluid. Review of Systems:   Pertinent items are noted in HPI Denies abnormal vaginal discharge w/ itching/odor/irritation, headaches, visual changes, shortness of breath, chest pain, abdominal pain, severe nausea/vomiting, or problems with urination or bowel movements unless otherwise stated above. Pertinent History Reviewed:  Reviewed past medical,surgical, social, obstetrical and family history.  Reviewed problem list, medications and allergies. Physical Assessment:   Vitals:   08/30/23 1602  BP: 122/73  Pulse: 77  Weight: 217 lb 8 oz (98.7 kg)  Body mass index is 34.07 kg/m.        Physical Examination:   General appearance: Well appearing, and in no distress  Mental status: Alert, oriented to person, place, and time  Skin: Warm & dry  Cardiovascular: Normal heart rate noted  Respiratory: Normal respiratory effort, no distress  Abdomen: Soft, gravid, nontender  Pelvic: Cervical exam deferred         Extremities: Edema: Trace  Fetal Status: Fetal Heart Rate (bpm): 138 Fundal Height: 35 cm Movement: Present    No results found for this or any previous visit (from the past 24 hour(s)).  Assessment & Plan:  1) Low-risk pregnancy G1P0000 at [redacted]w[redacted]d with an Estimated Date of Delivery: 10/07/23   2)  Supervision of other normal pregnancy, antepartum - Verified EDC is calculated by 7.5 wks U/S - Explained to patient that the best [facility] is calculated by an early ultrasound, if it is not c/w LMP. The [facility] calculated from her LMP is off by 7 days; which is why we are using the early U/S dating. - Explained that EDC is estimated date of confinement; which means her baby could come +/- 2 weeks before or after her EDC. EDC is NOT a definite date that delivery has to occur. - Still interested in waterbirth>>has taken class on 07/13/2023, but has not submitted certificate to our office. - Asked to bring certificate to her next appointment or send a copy via MyChart message.  - Explained that having a copy on file before delivery is a part of the process of being a candidate for a waterbirth. - Anticipatory guidance for GBS screening at 36 wks. Explained the test is important to be done at this time in pregnancy to ensure adequate treatment at the time of delivery. Explained that a positive result does not mean any harm to her, but can be harmful to the baby. Meaning that if baby is exposed to the bacteria for too long without antibiotics, the baby has the potential to develop pneumonia, septicemia, or spinal meningitis and could end up in the NICU. Also, explained that a cervical exam may be performed at the time of testing to get a baseline cervical check and make sure there is no preterm cervical dilation.  3) Obesity affecting pregnancy, antepartum, unspecified obesity  type   4) Gestational thrombocytopenia, third trimester (HCC) - Advised will need CBC at next visit for updated platelet count   5) [redacted] weeks gestation of pregnancy    Meds: No orders of the defined types were placed in this encounter.  Labs/procedures today: none  Plan:  Continue routine obstetrical care   Reviewed: Preterm labor symptoms and general obstetric precautions including but not limited to vaginal bleeding, contractions,  leaking of fluid and fetal movement were reviewed in detail with the patient.  All questions were answered. Has home bp cuff. Check bp weekly, let us know if >140/90.   Follow-up: Return in about 2 weeks (around 09/13/2023) for Return OB visit.  No orders of the defined types were placed in this encounter.  Raelyn Mora MSN, CNM 08/30/2023 5:13 PM

## 2023-09-07 ENCOUNTER — Ambulatory Visit: Payer: 59 | Admitting: Family Medicine

## 2023-09-08 ENCOUNTER — Encounter: Payer: Self-pay | Admitting: *Deleted

## 2023-09-08 DIAGNOSIS — O4703 False labor before 37 completed weeks of gestation, third trimester: Secondary | ICD-10-CM | POA: Insufficient documentation

## 2023-09-13 ENCOUNTER — Ambulatory Visit (INDEPENDENT_AMBULATORY_CARE_PROVIDER_SITE_OTHER): Payer: 59 | Admitting: Obstetrics and Gynecology

## 2023-09-13 ENCOUNTER — Other Ambulatory Visit (HOSPITAL_COMMUNITY)
Admission: RE | Admit: 2023-09-13 | Discharge: 2023-09-13 | Disposition: A | Discharge status: Home / Self Care | Payer: 59 | Source: Ambulatory Visit | Attending: Obstetrics and Gynecology | Admitting: Obstetrics and Gynecology

## 2023-09-13 ENCOUNTER — Encounter: Payer: Self-pay | Admitting: Obstetrics and Gynecology

## 2023-09-13 VITALS — BP 118/78 | HR 72 | Wt 219.4 lb

## 2023-09-13 DIAGNOSIS — Z3A36 36 weeks gestation of pregnancy: Secondary | ICD-10-CM

## 2023-09-13 DIAGNOSIS — Z3493 Encounter for supervision of normal pregnancy, unspecified, third trimester: Secondary | ICD-10-CM | POA: Diagnosis not present

## 2023-09-13 DIAGNOSIS — O99113 Other diseases of the blood and blood-forming organs and certain disorders involving the immune mechanism complicating pregnancy, third trimester: Secondary | ICD-10-CM | POA: Diagnosis not present

## 2023-09-13 DIAGNOSIS — D696 Thrombocytopenia, unspecified: Secondary | ICD-10-CM

## 2023-09-13 DIAGNOSIS — Z3480 Encounter for supervision of other normal pregnancy, unspecified trimester: Secondary | ICD-10-CM

## 2023-09-13 NOTE — Patient Instructions (Signed)
[email or web address]

## 2023-09-13 NOTE — Progress Notes (Signed)
   PRENATAL VISIT NOTE  Subjective:  Jessica Hendrix is a 22 y.o. G1P0000 at [redacted]w[redacted]d being seen today for ongoing prenatal care.  She is currently monitored for the following issues for this low-risk pregnancy and has Moderate persistent asthma without complication; Supervision of other normal pregnancy, antepartum; Obesity affecting pregnancy, antepartum; Gestational thrombocytopenia, third trimester [facility]); and Preterm uterine contractions in third trimester, antepartum on their problem list.  Patient reports no complaints.  Contractions: Irritability. Vag. Bleeding: None.  Movement: Present. Denies leaking of fluid.   The following portions of the patient's history were reviewed and updated as appropriate: allergies, current medications, past family history, past medical history, past social history, past surgical history and problem list.   Objective:   Vitals:   09/13/23 1631  BP: 118/78  Pulse: 72  Weight: 219 lb 6.4 oz (99.5 kg)    Fetal Status: Fetal Heart Rate (bpm): 131   Movement: Present  Presentation: Vertex  General:  Alert, oriented and cooperative. Patient is in no acute distress.  Skin: Skin is warm and dry. No rash noted.   Cardiovascular: Normal heart rate noted  Respiratory: Normal respiratory effort, no problems with respiration noted  Abdomen: Soft, gravid, appropriate for gestational age.  Pain/Pressure: Present     Pelvic: Cervical exam performed in the presence of a chaperone Dilation: 1 Effacement (%): 50 Station: -3  Extremities: Normal range of motion.  Edema: Trace  Mental Status: Normal mood and affect. Normal behavior. Normal judgment and thought content.   Assessment and Plan:  Pregnancy: G1P0000 at [redacted]w[redacted]d 1. Supervision of other normal pregnancy, antepartum (Primary) BP and FHR normal Doing well, feeling regular movement    2. [redacted] weeks gestation of pregnancy Swabs collected today  Labor precautions discussed  Waterbirth consent in chart Discussed  labor readiness tools, and collecting breast milk after 37 weeks  - Cervicovaginal ancillary only( [place]) - CBC - Strep Gp B Culture+Rflx  3. Gestational thrombocytopenia, third trimester (HCC) CBC today    Preterm labor symptoms and general obstetric precautions including but not limited to vaginal bleeding, contractions, leaking of fluid and fetal movement were reviewed in detail with the patient. Please refer to After Visit Summary for other counseling recommendations.   Return in one week for routine prenatal   Future Appointments  Date Time Provider Department Center  09/14/2023  3:15 PM [facility] NURSE [facility]  09/14/2023  3:30 PM [facility]2 [facility]  09/18/2023  4:10 PM Lennart Pall, MD [facility] None    Albertine Grates, [place]

## 2023-09-13 NOTE — Progress Notes (Signed)
Pt presents for ROB visit. Pt c.o lower abd pain.

## 2023-09-14 ENCOUNTER — Ambulatory Visit: Payer: 59 | Admitting: *Deleted

## 2023-09-14 ENCOUNTER — Ambulatory Visit: Payer: 59 | Admitting: Maternal & Fetal Medicine

## 2023-09-14 ENCOUNTER — Inpatient Hospital Stay (HOSPITAL_COMMUNITY)
Admission: AD | Admit: 2023-09-14 | Discharge: 2023-09-17 | DRG: 787 | Disposition: A | Discharge status: Home / Self Care | Payer: 59 | Attending: Obstetrics and Gynecology | Admitting: Obstetrics and Gynecology

## 2023-09-14 ENCOUNTER — Ambulatory Visit: Payer: 59

## 2023-09-14 ENCOUNTER — Encounter (HOSPITAL_COMMUNITY): Payer: Self-pay | Admitting: Obstetrics and Gynecology

## 2023-09-14 ENCOUNTER — Other Ambulatory Visit: Payer: Self-pay

## 2023-09-14 VITALS — BP 122/77 | HR 82

## 2023-09-14 DIAGNOSIS — Z98891 History of uterine scar from previous surgery: Secondary | ICD-10-CM

## 2023-09-14 DIAGNOSIS — Z3A36 36 weeks gestation of pregnancy: Secondary | ICD-10-CM

## 2023-09-14 DIAGNOSIS — O9952 Diseases of the respiratory system complicating childbirth: Secondary | ICD-10-CM | POA: Diagnosis present

## 2023-09-14 DIAGNOSIS — O99513 Diseases of the respiratory system complicating pregnancy, third trimester: Secondary | ICD-10-CM | POA: Insufficient documentation

## 2023-09-14 DIAGNOSIS — O4103X0 Oligohydramnios, third trimester, not applicable or unspecified: Secondary | ICD-10-CM

## 2023-09-14 DIAGNOSIS — O99213 Obesity complicating pregnancy, third trimester: Secondary | ICD-10-CM | POA: Insufficient documentation

## 2023-09-14 DIAGNOSIS — J45909 Unspecified asthma, uncomplicated: Secondary | ICD-10-CM | POA: Diagnosis present

## 2023-09-14 DIAGNOSIS — Z3A00 Weeks of gestation of pregnancy not specified: Secondary | ICD-10-CM | POA: Diagnosis not present

## 2023-09-14 DIAGNOSIS — Z5986 Financial insecurity: Secondary | ICD-10-CM | POA: Diagnosis not present

## 2023-09-14 DIAGNOSIS — Z3480 Encounter for supervision of other normal pregnancy, unspecified trimester: Secondary | ICD-10-CM

## 2023-09-14 DIAGNOSIS — O9912 Other diseases of the blood and blood-forming organs and certain disorders involving the immune mechanism complicating childbirth: Secondary | ICD-10-CM | POA: Diagnosis present

## 2023-09-14 DIAGNOSIS — D696 Thrombocytopenia, unspecified: Secondary | ICD-10-CM | POA: Diagnosis not present

## 2023-09-14 DIAGNOSIS — E669 Obesity, unspecified: Secondary | ICD-10-CM | POA: Diagnosis not present

## 2023-09-14 DIAGNOSIS — J4540 Moderate persistent asthma, uncomplicated: Secondary | ICD-10-CM | POA: Insufficient documentation

## 2023-09-14 DIAGNOSIS — Z3490 Encounter for supervision of normal pregnancy, unspecified, unspecified trimester: Principal | ICD-10-CM

## 2023-09-14 DIAGNOSIS — D6959 Other secondary thrombocytopenia: Secondary | ICD-10-CM | POA: Diagnosis present

## 2023-09-14 DIAGNOSIS — Z833 Family history of diabetes mellitus: Secondary | ICD-10-CM | POA: Diagnosis not present

## 2023-09-14 DIAGNOSIS — Z880 Allergy status to penicillin: Secondary | ICD-10-CM | POA: Diagnosis not present

## 2023-09-14 DIAGNOSIS — O4100X0 Oligohydramnios, unspecified trimester, not applicable or unspecified: Secondary | ICD-10-CM | POA: Insufficient documentation

## 2023-09-14 LAB — CBC
HCT: 36.8 % (ref 36.0–46.0)
Hematocrit: 37.8 % (ref 34.0–46.6)
Hemoglobin: 12.3 g/dL (ref 11.1–15.9)
Hemoglobin: 11.9 g/dL — ABNORMAL LOW (ref 12.0–15.0)
MCH: 27 pg (ref 26.6–33.0)
MCH: 26.8 pg (ref 26.0–34.0)
MCHC: 32.5 g/dL (ref 31.5–35.7)
MCHC: 32.3 g/dL (ref 30.0–36.0)
MCV: 83 fL (ref 79–97)
MCV: 82.9 fL (ref 80.0–100.0)
Platelets: 129 10*3/uL — ABNORMAL LOW (ref 150–450)
Platelets: 129 10*3/uL — ABNORMAL LOW (ref 150–400)
RBC: 4.56 x10E6/uL (ref 3.77–5.28)
RBC: 4.44 MIL/uL (ref 3.87–5.11)
RDW: 13.2 % (ref 11.7–15.4)
RDW: 13.9 % (ref 11.5–15.5)
WBC: 9.2 10*3/uL (ref 3.4–10.8)
WBC: 10.2 10*3/uL (ref 4.0–10.5)
nRBC: 0 % (ref 0.0–0.2)

## 2023-09-14 LAB — TYPE AND SCREEN
ABO/RH(D): O POS
Antibody Screen: NEGATIVE

## 2023-09-14 MED ORDER — FENTANYL CITRATE (PF) 100 MCG/2ML IJ SOLN
100.0000 ug | INTRAMUSCULAR | Status: DC | PRN
  Administered 2023-09-15 (×3): 100 ug via INTRAVENOUS
  Filled 2023-09-14 (×4): qty 2

## 2023-09-14 MED ORDER — LIDOCAINE HCL (PF) 1 % IJ SOLN: 30.0000 mL | INTRAMUSCULAR | Status: DC | PRN

## 2023-09-14 MED ORDER — ACETAMINOPHEN 325 MG PO TABS: 650.0000 mg | ORAL_TABLET | ORAL | Status: DC | PRN

## 2023-09-14 MED ORDER — ZOLPIDEM TARTRATE 5 MG PO TABS: 5.0000 mg | ORAL_TABLET | Freq: Every evening | ORAL | Status: DC | PRN

## 2023-09-14 MED ORDER — MISOPROSTOL 25 MCG QUARTER TABLET: 25.0000 ug | ORAL_TABLET | Freq: Once | ORAL | Status: DC

## 2023-09-14 MED ORDER — LACTATED RINGERS IV SOLN: INTRAVENOUS | Status: DC

## 2023-09-14 MED ORDER — SODIUM CHLORIDE 0.9% FLUSH
3.0000 mL | Freq: Two times a day (BID) | INTRAVENOUS | Status: DC
Start: 2023-09-14 — End: 2023-09-17
  Administered 2023-09-16: 10 mL via INTRAVENOUS
  Administered 2023-09-16: 5 mL via INTRAVENOUS
  Administered 2023-09-16: 10 mL via INTRAVENOUS

## 2023-09-14 MED ORDER — OXYTOCIN-SODIUM CHLORIDE 30-0.9 UT/500ML-% IV SOLN: 2.5000 [IU]/h | INTRAVENOUS | Status: DC

## 2023-09-14 MED ORDER — OXYCODONE-ACETAMINOPHEN 5-325 MG PO TABS
2.0000 | ORAL_TABLET | ORAL | Status: DC | PRN
Start: 2023-09-14 — End: 2023-09-14

## 2023-09-14 MED ORDER — OXYTOCIN-SODIUM CHLORIDE 30-0.9 UT/500ML-% IV SOLN
1.0000 m[IU]/min | INTRAVENOUS | Status: DC
  Administered 2023-09-15: 2 m[IU]/min via INTRAVENOUS
  Filled 2023-09-14: qty 500

## 2023-09-14 MED ORDER — ACETAMINOPHEN 325 MG PO TABS
650.0000 mg | ORAL_TABLET | ORAL | Status: DC | PRN
Start: 2023-09-14 — End: 2023-09-15

## 2023-09-14 MED ORDER — TERBUTALINE SULFATE 1 MG/ML IJ SOLN: 0.2500 mg | Freq: Once | INTRAMUSCULAR | Status: DC | PRN

## 2023-09-14 MED ORDER — OXYTOCIN-SODIUM CHLORIDE 30-0.9 UT/500ML-% IV SOLN
2.5000 [IU]/h | INTRAVENOUS | Status: DC
Start: 2023-09-14 — End: 2023-09-15

## 2023-09-14 MED ORDER — HYDROXYZINE HCL 50 MG PO TABS: 50.0000 mg | ORAL_TABLET | Freq: Four times a day (QID) | ORAL | Status: DC | PRN

## 2023-09-14 MED ORDER — OXYCODONE-ACETAMINOPHEN 5-325 MG PO TABS: 1.0000 | ORAL_TABLET | ORAL | Status: DC | PRN

## 2023-09-14 MED ORDER — CEFAZOLIN SODIUM-DEXTROSE 1-4 GM/50ML-% IV SOLN
1.0000 g | Freq: Three times a day (TID) | INTRAVENOUS | Status: DC
Start: 2023-09-15 — End: 2023-09-15
  Administered 2023-09-15 (×2): 1 g via INTRAVENOUS
  Filled 2023-09-14 (×4): qty 50

## 2023-09-14 MED ORDER — MISOPROSTOL 50MCG HALF TABLET: 50.0000 ug | ORAL_TABLET | ORAL | Status: DC

## 2023-09-14 MED ORDER — OXYCODONE-ACETAMINOPHEN 5-325 MG PO TABS
1.0000 | ORAL_TABLET | ORAL | Status: DC | PRN
Start: 2023-09-14 — End: 2023-09-15

## 2023-09-14 MED ORDER — LACTATED RINGERS IV SOLN: 500.0000 mL | INTRAVENOUS | Status: DC | PRN

## 2023-09-14 MED ORDER — SODIUM CHLORIDE 0.9% FLUSH: 3.0000 mL | INTRAVENOUS | Status: DC | PRN

## 2023-09-14 MED ORDER — FLEET ENEMA RE ENEM: 1.0000 | ENEMA | RECTAL | Status: DC | PRN

## 2023-09-14 MED ORDER — OXYTOCIN BOLUS FROM INFUSION: 333.0000 mL | Freq: Once | INTRAVENOUS | Status: DC

## 2023-09-14 MED ORDER — SOD CITRATE-CITRIC ACID 500-334 MG/5ML PO SOLN: 30.0000 mL | ORAL | Status: DC | PRN

## 2023-09-14 MED ORDER — CEFAZOLIN SODIUM-DEXTROSE 2-4 GM/100ML-% IV SOLN
2.0000 g | Freq: Once | INTRAVENOUS | Status: AC
Start: 2023-09-14 — End: 2023-09-14
  Administered 2023-09-14: 2 g via INTRAVENOUS
  Filled 2023-09-14: qty 100

## 2023-09-14 MED ORDER — OXYTOCIN BOLUS FROM INFUSION
333.0000 mL | Freq: Once | INTRAVENOUS | Status: DC
Start: 2023-09-14 — End: 2023-09-15

## 2023-09-14 MED ORDER — ONDANSETRON HCL 4 MG/2ML IJ SOLN
4.0000 mg | Freq: Four times a day (QID) | INTRAMUSCULAR | Status: DC | PRN
Start: 2023-09-14 — End: 2023-09-15
  Administered 2023-09-15: 4 mg via INTRAVENOUS
  Filled 2023-09-14: qty 2

## 2023-09-14 MED ORDER — OXYCODONE-ACETAMINOPHEN 5-325 MG PO TABS: 2.0000 | ORAL_TABLET | ORAL | Status: DC | PRN

## 2023-09-14 MED ORDER — SOD CITRATE-CITRIC ACID 500-334 MG/5ML PO SOLN
30.0000 mL | ORAL | Status: DC | PRN
Start: 2023-09-14 — End: 2023-09-15
  Filled 2023-09-14: qty 30

## 2023-09-14 NOTE — Progress Notes (Signed)
Patient Vitals for the past 4 hrs:  BP Temp Temp src Pulse Resp  09/14/23 2319 115/83 98.1 F (36.7 C) Oral 97 14  09/14/23 2004 135/87 -- -- 77 14   Cooks out, cx 5/60/-2. AROM w/clear fluid.  Ctx mild, q 2-3  minutes. Declines Pitocin at this time.  If ctx strength doesn't increase in the next 45-60 minutes. Plans on using a breast pump for nipple stimulation.  If that doesn't work to augment labor, agrees to pitocin in 4 hours. FHR Cat 1, no more decels.

## 2023-09-14 NOTE — Progress Notes (Signed)
   Patient information  Patient Name: Jessica Hendrix  Patient MRN:   [id number]  Referring practice: MFM Referring Provider: [place] [facility]  MFM CONSULT  Jessica Hendrix is a 22 y.o. G1P0000 at [redacted]w[redacted]d here for ultrasound and consultation. Patient Active Problem List   Diagnosis Date Noted   Preterm uterine contractions in third trimester, antepartum 09/08/2023   Gestational thrombocytopenia, third trimester (HCC) 07/20/2023   Obesity affecting pregnancy, antepartum 05/08/2023   Supervision of other normal pregnancy, antepartum 03/13/2023   Moderate persistent asthma without complication 01/13/2023   The patient was here for a growth ultrasound due to an elevated BMI.  Her fetal biometry is normal at the 18th percentile.  However, the amniotic fluid is very low at 1.7 cm.  There are no other fluid pockets on ultrasound.  The patient denies any loss of fluid.  She does report increased contractions but denies bleeding.  She reports good fetal movement.  She has gestational thrombocytopenia that is stable at 129,000 on yesterday's lab work.  Sonographic findings Single intrauterine pregnancy at [redacted]w[redacted]d.  Fetal cardiac activity:  Observed and appears normal. Presentation: Cephalic. Interval fetal anatomy appears normal. Fetal biometry shows the estimated fetal weight at the 18 percentile. Amniotic fluid volume: Oligohydramnios. MVP: 1.7 cm. Placenta: Anterior.  Recommendations -Due to oligohydramnios it was recommended the patient be direct admitted for delivery.  I discussed it is possible her water broke but also the low fluid may be result from decreased placental perfusion and may be a sign of placental insufficiency.  To avoid stillbirth the patient elected for delivery versus continued outpatient management.  The patient verbalized understanding and is agreeable to the plan. -If uterotonic's are required avoid Hemabate given the patient's history of asthma -Notify anesthesia  regarding her platelet count  I have notified the on-call OB provider Dr. Alvester Morin to arrange for direct admission.  Review of Systems: A review of systems was performed and was negative except per HPI   Vitals and Physical Exam    09/14/2023    3:18 PM 09/13/2023    4:31 PM 08/30/2023    4:02 PM  Vitals with BMI  Weight  219 lbs 6 oz 217 lbs 8 oz  BMI   34.06  Systolic 122 118 409  Diastolic 77 78 73  Pulse 82 72 77    Sitting comfortably on the sonogram table Nonlabored breathing Normal rate and rhythm Abdomen is nontender  Past pregnancies OB History  Gravida Para Term Preterm AB Living  1 0 0 0 0 0  SAB IAB Ectopic Multiple Live Births  0 0 0 0 0    # Outcome Date GA Lbr Len/2nd Weight Sex Type Anes PTL Lv  1 Current             I spent 20 minutes reviewing the patients chart, including labs and images as well as counseling the patient about her medical conditions. Greater than 50% of the time was spent in direct face-to-face patient counseling.  Jessica Hendrix  MFM, [facility]   09/14/2023  4:25 PM

## 2023-09-14 NOTE — H&P (Signed)
Jessica Hendrix is a 22 y.o. female G1P0000 with IUP at [redacted]w[redacted]d presenting for IOL for oligohydramnios (AFI 1.7cm today). [facility] at [facility]   Prenatal History/Complications:  As above  Past Medical History: Past Medical History:  Diagnosis Date   Allergy    Anemia    Anxiety    Asthma    Constipation    Depression    Depression, major, single episode, moderate (HCC) 12/05/2019   GAD (generalized anxiety disorder) 03/08/2020   Pyelonephritis    Supervision of other normal pregnancy, antepartum 03/13/2023              NURSING     PROVIDER      Office Location    [facility]    Dating by    LMP c/w U/S at 7 wks      [facility] Model    Traditional    Anatomy U/S    incomplete      Initiated care at     [facility]     English                     LAB RESULTS       Support Person         Genetics    NIPS: LR  AFP:  neg                NT/IT (FT only)                     Carrier Screen       Twitch 12/05/2019   UTI (urinary tract infection)     Past Surgical History: Past Surgical History:  Procedure Laterality Date   WISDOM TOOTH EXTRACTION      Obstetrical History: OB History     Gravida  1   Para  0   Term  0   Preterm  0   AB  0   Living  0      SAB  0   IAB  0   Ectopic  0   Multiple  0   Live Births  0           Social History: Social History   Socioeconomic History   Marital status: Single    Spouse name: Not on file   Number of children: 0   Years of education: Not on file   Highest education level: 12th grade  Occupational History   Not on file  Tobacco Use   Smoking status: Never   Smokeless tobacco: Never  Vaping Use   Vaping status: Never Used  Substance and Sexual Activity   Alcohol use: Not Currently   Drug use: Not Currently    Types: Marijuana    Comment: last use feb 2024   Sexual activity: Not Currently    Birth control/protection: Condom  Other Topics Concern   Not on file  Social History Narrative    Female partner   Programmer, multimedia HT   Social Drivers of Health   Financial Resource Strain: Medium Risk (01/13/2023)   Overall Financial Resource Strain (CARDIA)    Difficulty of Paying Living Expenses: Somewhat hard  Food Insecurity: No Food Insecurity (09/14/2023)   Hunger Vital Sign    Worried About Running Out of Food in the Last Year: Never  true    Ran Out of Food in the Last Year: Never true  Transportation Needs: No Transportation Needs (09/14/2023)   PRAPARE - Administrator, Civil Service (Medical): No    Lack of Transportation (Non-Medical): No  Physical Activity: Sufficiently Active (01/13/2023)   Exercise Vital Sign    Days of Exercise per Week: 7 days    Minutes of Exercise per Session: 50 min  Stress: Stress Concern Present (01/13/2023)   [employer] of Occupational Health - Occupational Stress Questionnaire    Feeling of Stress : To some extent  Social Connections: Unknown (01/13/2023)   Social Connection and Isolation Panel [NHANES]    Frequency of Communication with Friends and Family: More than three times a week    Frequency of Social Gatherings with Friends and Family: More than three times a week    Attends Religious Services: Never    Database administrator or Organizations: Yes    Attends Engineer, structural: More than 4 times per year    Marital Status: Patient declined    Family History: Family History  Problem Relation Age of Onset   Asthma Mother    Sleep apnea Mother    Obstructive Sleep Apnea Mother    Diabetes Father    Allergic rhinitis Neg Hx    Angioedema Neg Hx    Atopy Neg Hx    Eczema Neg Hx    Immunodeficiency Neg Hx    Urticaria Neg Hx     Allergies: Allergies  Allergen Reactions   Cinnamon     spices   Penicillins Other (See Comments)    unk   Citrus Rash    Medications Prior to Admission  Medication Sig Dispense Refill Last Dose/Taking   Blood Pressure Monitoring (BLOOD PRESSURE KIT) DEVI 1  Device by Does not apply route once a week. (Patient not taking: Reported on 09/13/2023) 1 each 0    budesonide-formoterol (SYMBICORT) 80-4.5 MCG/ACT inhaler Inhale 2 puffs into the lungs every 6 (six) hours. (Patient not taking: Reported on 09/13/2023) 1 each 12    budesonide-formoterol (SYMBICORT) 80-4.5 MCG/ACT inhaler Inhale 2 puffs into the lungs every 6 (six) hours. (Patient not taking: Reported on 09/13/2023) 1 each 12    ondansetron (ZOFRAN) 4 MG tablet Take 2 tablets (8 mg total) by mouth every 8 (eight) hours as needed for nausea or vomiting. 180 tablet 0    Prenatal Vit-Fe Fumarate-FA (PRENATAL VITAMIN PO) Take 1 tablet by mouth daily.           Review of Systems   Constitutional: Negative for fever and chills Eyes: Negative for visual disturbances Respiratory: Negative for shortness of breath, dyspnea Cardiovascular: Negative for chest pain or palpitations  Gastrointestinal: Negative for abdominal pain, vomiting, diarrhea and constipation.   Genitourinary: Negative for dysuria and urgency Musculoskeletal: Negative for back pain, joint pain, myalgias  Neurological: Negative for dizziness and headaches      Blood pressure 97/76, pulse 88, temperature 98.7 F (37.1 C), temperature source Oral, resp. rate 16, height 5\' 7"  (1.702 m), weight 99.8 kg, last menstrual period 12/23/2022, SpO2 100%. General appearance: alert, cooperative, and no distress Lungs: normal respiratory effort Heart: regular rate and rhythm Abdomen: soft, non-tender; bowel sounds normal Extremities: Homans sign is negative, no sign of DVT DTR's 2+ Presentation: cephalic Fetal monitoring  Baseline: 130 bpm, Variability: Good {> 6 bpm), Accelerations: Reactive, and Decelerations: Variable: 5 minutes in 80s/90s spontaneously Uterine activity  occasional and mild  Cx  1/50/-2   Prenatal labs: GBS:   culture pending  NURSING  PROVIDER  Office Location [facility] Dating by LMP c/w U/S at 7 wks  [facility] Model  Traditional Anatomy U/S incomplete  Initiated care at  [facility]                Language  English              LAB RESULTS   Support Person  FOB Genetics NIPS: LR AFP:  neg    NT/IT (FT only)     Carrier Screen Horizon: neg  Rhogam  O/Positive/-- (07/24 1351) A1C/GTT Early: 5.7 [ ]  early 2h Third trimester:   Flu Vaccine     TDaP Vaccine   Blood Type O/Positive/-- (07/24 1351)  Covid Vaccine  YES Antibody Negative (07/24 1351)    Rubella 4.29 (07/24 1351)  Feeding Plan breast RPR Non Reactive (07/24 1351)  Contraception undecided HBsAg Negative (07/24 1351)  Circumcision  Yes if female HIV Non Reactive (07/24 1351)  Pediatrician   Unsure HCVAb Non Reactive (07/24 1351)  Prenatal Classes       Pap N/a  BTLConsent  GC/CT Initial:  neg 36wks:    VBAC  Consent  GBS   For PCN allergy, check sensitivities        DME Rx [ x] BP cuff [ ]  Weight Scale Waterbirth  [ ]  Class [ ]  Consent [ ]  CNM visit  PHQ9 & GAD7 [  x] new OB [  ] 28 weeks  [  ] 36 weeks Induction  [ ]  Orders Entered [ ] Foley Y/N    Prenatal Transfer Tool  Maternal Diabetes: No Genetic Screening: Normal Maternal Ultrasounds/Referrals: Other: oligohydramnios Fetal Ultrasounds or other Referrals:  None Maternal Substance Abuse:  No Significant Maternal Medications:  None Significant Maternal Lab Results: GBS pending    Results for orders placed or performed during the hospital encounter of 09/14/23 (from the past 24 hours)  Type and screen [facility]   Collection Time: 09/14/23  6:49 PM  Result Value Ref Range   ABO/RH(D) PENDING    Antibody Screen PENDING    Sample Expiration      09/17/2023,2359 Performed at [facility], [address], [place], [place] [address]     Assessment: Jessica Hendrix is a 22 y.o. G1P0000 with an IUP at [redacted]w[redacted]d presenting for IOL for oligohydramnios.  Plan: #Labor: D/T spontaneous prolonged decel, will not do cytotec.  Cooks inserted into cx and inflated w/60cc  H20.  #Pain:  Per request #FWB Cat 1 #ID: GBS: Ancef  #MOF:  breast #MOC: discussed options #Circ: yes   Jacklyn Shell 09/14/2023, 7:20 PM

## 2023-09-15 ENCOUNTER — Inpatient Hospital Stay (HOSPITAL_COMMUNITY): Payer: 59 | Admitting: Anesthesiology

## 2023-09-15 ENCOUNTER — Other Ambulatory Visit: Payer: Self-pay

## 2023-09-15 ENCOUNTER — Encounter (HOSPITAL_COMMUNITY): Payer: Self-pay | Admitting: Obstetrics and Gynecology

## 2023-09-15 ENCOUNTER — Encounter (HOSPITAL_COMMUNITY)
Admission: AD | Disposition: A | Discharge status: Home / Self Care | Payer: Self-pay | Source: Home / Self Care | Attending: Obstetrics and Gynecology

## 2023-09-15 DIAGNOSIS — Z3A36 36 weeks gestation of pregnancy: Secondary | ICD-10-CM

## 2023-09-15 DIAGNOSIS — O9912 Other diseases of the blood and blood-forming organs and certain disorders involving the immune mechanism complicating childbirth: Secondary | ICD-10-CM

## 2023-09-15 DIAGNOSIS — O4103X0 Oligohydramnios, third trimester, not applicable or unspecified: Secondary | ICD-10-CM

## 2023-09-15 DIAGNOSIS — Z98891 History of uterine scar from previous surgery: Secondary | ICD-10-CM

## 2023-09-15 LAB — CBC
HCT: 34.7 % — ABNORMAL LOW (ref 36.0–46.0)
HCT: 30.4 % — ABNORMAL LOW (ref 36.0–46.0)
Hemoglobin: 11.3 g/dL — ABNORMAL LOW (ref 12.0–15.0)
Hemoglobin: 9.9 g/dL — ABNORMAL LOW (ref 12.0–15.0)
MCH: 26.7 pg (ref 26.0–34.0)
MCH: 26.9 pg (ref 26.0–34.0)
MCHC: 32.6 g/dL (ref 30.0–36.0)
MCHC: 32.6 g/dL (ref 30.0–36.0)
MCV: 82 fL (ref 80.0–100.0)
MCV: 82.6 fL (ref 80.0–100.0)
Platelets: 120 10*3/uL — ABNORMAL LOW (ref 150–400)
Platelets: 117 10*3/uL — ABNORMAL LOW (ref 150–400)
RBC: 4.23 MIL/uL (ref 3.87–5.11)
RBC: 3.68 MIL/uL — ABNORMAL LOW (ref 3.87–5.11)
RDW: 14.1 % (ref 11.5–15.5)
RDW: 14 % (ref 11.5–15.5)
WBC: 15 10*3/uL — ABNORMAL HIGH (ref 4.0–10.5)
WBC: 18.2 10*3/uL — ABNORMAL HIGH (ref 4.0–10.5)
nRBC: 0 % (ref 0.0–0.2)
nRBC: 0 % (ref 0.0–0.2)

## 2023-09-15 LAB — CERVICOVAGINAL ANCILLARY ONLY
Chlamydia: NEGATIVE
Comment: NEGATIVE
Comment: NEGATIVE
Comment: NORMAL
Neisseria Gonorrhea: NEGATIVE
Trichomonas: NEGATIVE

## 2023-09-15 LAB — RPR: RPR Ser Ql: NONREACTIVE

## 2023-09-15 SURGERY — Surgical Case
Anesthesia: Epidural

## 2023-09-15 MED ORDER — PHENYLEPHRINE 80 MCG/ML (10ML) SYRINGE FOR IV PUSH (FOR BLOOD PRESSURE SUPPORT)
PREFILLED_SYRINGE | INTRAVENOUS | Status: AC
  Filled 2023-09-15: qty 10

## 2023-09-15 MED ORDER — COCONUT OIL OIL: 1.0000 | TOPICAL_OIL | Status: DC | PRN

## 2023-09-15 MED ORDER — ACETAMINOPHEN 500 MG PO TABS
1000.0000 mg | ORAL_TABLET | Freq: Four times a day (QID) | ORAL | Status: DC
  Administered 2023-09-16 – 2023-09-17 (×6): 1000 mg via ORAL
  Filled 2023-09-15 (×6): qty 2

## 2023-09-15 MED ORDER — LACTATED RINGERS IV SOLN: 500.0000 mL | Freq: Once | INTRAVENOUS | Status: DC

## 2023-09-15 MED ORDER — EPHEDRINE 5 MG/ML INJ: 10.0000 mg | INTRAVENOUS | Status: DC | PRN

## 2023-09-15 MED ORDER — DIPHENHYDRAMINE HCL 50 MG/ML IJ SOLN: 12.5000 mg | INTRAMUSCULAR | Status: DC | PRN

## 2023-09-15 MED ORDER — AZITHROMYCIN 500 MG IV SOLR
INTRAVENOUS | Status: AC
  Filled 2023-09-15: qty 5

## 2023-09-15 MED ORDER — DEXAMETHASONE SODIUM PHOSPHATE 4 MG/ML IJ SOLN
INTRAMUSCULAR | Status: AC
Start: 2023-09-15 — End: ?
  Filled 2023-09-15: qty 1

## 2023-09-15 MED ORDER — DEXAMETHASONE SODIUM PHOSPHATE 4 MG/ML IJ SOLN
INTRAMUSCULAR | Status: AC
  Filled 2023-09-15: qty 2

## 2023-09-15 MED ORDER — PROPOFOL 10 MG/ML IV BOLUS
INTRAVENOUS | Status: DC | PRN
  Administered 2023-09-15: 20 mg via INTRAVENOUS

## 2023-09-15 MED ORDER — PHENYLEPHRINE 80 MCG/ML (10ML) SYRINGE FOR IV PUSH (FOR BLOOD PRESSURE SUPPORT): 80.0000 ug | PREFILLED_SYRINGE | INTRAVENOUS | Status: DC | PRN

## 2023-09-15 MED ORDER — ACETAMINOPHEN 10 MG/ML IV SOLN: 1000.0000 mg | Freq: Once | INTRAVENOUS | Status: DC | PRN

## 2023-09-15 MED ORDER — FENTANYL CITRATE (PF) 100 MCG/2ML IJ SOLN: 25.0000 ug | INTRAMUSCULAR | Status: DC | PRN

## 2023-09-15 MED ORDER — DIBUCAINE (PERIANAL) 1 % EX OINT
1.0000 | TOPICAL_OINTMENT | CUTANEOUS | Status: DC | PRN
  Filled 2023-09-15: qty 28

## 2023-09-15 MED ORDER — SODIUM CHLORIDE 0.9 % IV SOLN
500.0000 mg | INTRAVENOUS | Status: AC
  Administered 2023-09-15: 500 mg via INTRAVENOUS

## 2023-09-15 MED ORDER — SOD CITRATE-CITRIC ACID 500-334 MG/5ML PO SOLN
30.0000 mL | ORAL | Status: AC
  Administered 2023-09-15: 30 mL via ORAL

## 2023-09-15 MED ORDER — SENNOSIDES-DOCUSATE SODIUM 8.6-50 MG PO TABS
2.0000 | ORAL_TABLET | Freq: Every day | ORAL | Status: DC
  Administered 2023-09-16 – 2023-09-17 (×2): 2 via ORAL
  Filled 2023-09-15 (×2): qty 2

## 2023-09-15 MED ORDER — DEXAMETHASONE SODIUM PHOSPHATE 4 MG/ML IJ SOLN
INTRAMUSCULAR | Status: DC | PRN
  Administered 2023-09-15: 8 mg via INTRAVENOUS

## 2023-09-15 MED ORDER — SODIUM CHLORIDE 0.9 % IR SOLN
Status: DC | PRN
  Administered 2023-09-15: 1000 mL

## 2023-09-15 MED ORDER — METOCLOPRAMIDE HCL 5 MG/ML IJ SOLN
INTRAMUSCULAR | Status: AC
  Filled 2023-09-15: qty 2

## 2023-09-15 MED ORDER — SCOPOLAMINE 1 MG/3DAYS TD PT72
MEDICATED_PATCH | TRANSDERMAL | Status: DC | PRN
  Administered 2023-09-15: 1 via TRANSDERMAL

## 2023-09-15 MED ORDER — DEXMEDETOMIDINE HCL IN NACL 80 MCG/20ML IV SOLN
INTRAVENOUS | Status: DC | PRN
  Administered 2023-09-15: 8 ug via INTRAVENOUS

## 2023-09-15 MED ORDER — PRENATAL MULTIVITAMIN CH
1.0000 | ORAL_TABLET | Freq: Every day | ORAL | Status: DC
  Administered 2023-09-16 – 2023-09-17 (×2): 1 via ORAL
  Filled 2023-09-15 (×2): qty 1

## 2023-09-15 MED ORDER — KETOROLAC TROMETHAMINE 30 MG/ML IJ SOLN
15.0000 mg | Freq: Once | INTRAMUSCULAR | Status: AC
Start: 2023-09-15 — End: 2023-09-15
  Administered 2023-09-15: 15 mg via INTRAVENOUS

## 2023-09-15 MED ORDER — MORPHINE SULFATE (PF) 0.5 MG/ML IJ SOLN
INTRAMUSCULAR | Status: AC
  Filled 2023-09-15: qty 10

## 2023-09-15 MED ORDER — BUPIVACAINE HCL (PF) 0.25 % IJ SOLN
INTRAMUSCULAR | Status: DC | PRN
  Administered 2023-09-15: 3 mL via EPIDURAL
  Administered 2023-09-15: 4 mL via EPIDURAL

## 2023-09-15 MED ORDER — CEFAZOLIN SODIUM-DEXTROSE 2-4 GM/100ML-% IV SOLN
2.0000 g | INTRAVENOUS | Status: AC
  Administered 2023-09-15: 2 g via INTRAVENOUS

## 2023-09-15 MED ORDER — MENTHOL 3 MG MT LOZG
1.0000 | LOZENGE | OROMUCOSAL | Status: DC | PRN
Start: 2023-09-15 — End: 2023-09-17

## 2023-09-15 MED ORDER — FENTANYL CITRATE (PF) 100 MCG/2ML IJ SOLN
INTRAMUSCULAR | Status: AC
  Filled 2023-09-15: qty 2

## 2023-09-15 MED ORDER — KETOROLAC TROMETHAMINE 30 MG/ML IJ SOLN
INTRAMUSCULAR | Status: AC
  Filled 2023-09-15: qty 1

## 2023-09-15 MED ORDER — FENTANYL-BUPIVACAINE-NACL 0.5-0.125-0.9 MG/250ML-% EP SOLN
12.0000 mL/h | EPIDURAL | Status: DC | PRN
  Administered 2023-09-15: 12 mL/h via EPIDURAL
  Filled 2023-09-15: qty 250

## 2023-09-15 MED ORDER — ONDANSETRON HCL 4 MG/2ML IJ SOLN
INTRAMUSCULAR | Status: AC
  Filled 2023-09-15: qty 2

## 2023-09-15 MED ORDER — ZOLPIDEM TARTRATE 5 MG PO TABS
5.0000 mg | ORAL_TABLET | Freq: Every evening | ORAL | Status: DC | PRN
Start: 2023-09-15 — End: 2023-09-17

## 2023-09-15 MED ORDER — ACETAMINOPHEN 10 MG/ML IV SOLN
INTRAVENOUS | Status: AC
  Filled 2023-09-15: qty 600

## 2023-09-15 MED ORDER — PHENYLEPHRINE HCL (PRESSORS) 10 MG/ML IV SOLN
INTRAVENOUS | Status: DC | PRN
  Administered 2023-09-15 (×5): 80 ug via INTRAVENOUS

## 2023-09-15 MED ORDER — SIMETHICONE 80 MG PO CHEW
80.0000 mg | CHEWABLE_TABLET | Freq: Three times a day (TID) | ORAL | Status: DC
  Administered 2023-09-16 – 2023-09-17 (×4): 80 mg via ORAL
  Filled 2023-09-15 (×4): qty 1

## 2023-09-15 MED ORDER — OXYTOCIN-SODIUM CHLORIDE 30-0.9 UT/500ML-% IV SOLN
INTRAVENOUS | Status: DC | PRN
  Administered 2023-09-15: 400 mL via INTRAVENOUS

## 2023-09-15 MED ORDER — ACETAMINOPHEN 10 MG/ML IV SOLN
INTRAVENOUS | Status: DC | PRN
  Administered 2023-09-15: 1000 mg via INTRAVENOUS

## 2023-09-15 MED ORDER — ONDANSETRON HCL 4 MG/2ML IJ SOLN
INTRAMUSCULAR | Status: DC | PRN
  Administered 2023-09-15: 4 mg via INTRAVENOUS

## 2023-09-15 MED ORDER — SODIUM BICARBONATE 8.4 % IV SOLN
INTRAVENOUS | Status: AC
  Filled 2023-09-15: qty 50

## 2023-09-15 MED ORDER — DIPHENHYDRAMINE HCL 25 MG PO CAPS
25.0000 mg | ORAL_CAPSULE | Freq: Four times a day (QID) | ORAL | Status: DC | PRN
  Administered 2023-09-15 – 2023-09-16 (×3): 25 mg via ORAL
  Filled 2023-09-15 (×3): qty 1

## 2023-09-15 MED ORDER — WITCH HAZEL-GLYCERIN EX PADS: 1.0000 | MEDICATED_PAD | CUTANEOUS | Status: DC | PRN

## 2023-09-15 MED ORDER — LIDOCAINE-EPINEPHRINE (PF) 2 %-1:200000 IJ SOLN
INTRAMUSCULAR | Status: DC | PRN
  Administered 2023-09-15: 3 mL via EPIDURAL

## 2023-09-15 MED ORDER — OXYTOCIN-SODIUM CHLORIDE 30-0.9 UT/500ML-% IV SOLN
INTRAVENOUS | Status: AC
  Filled 2023-09-15: qty 500

## 2023-09-15 MED ORDER — OXYCODONE HCL 5 MG PO TABS
5.0000 mg | ORAL_TABLET | ORAL | Status: DC | PRN
  Administered 2023-09-17: 10 mg via ORAL
  Filled 2023-09-15: qty 2

## 2023-09-15 MED ORDER — STERILE WATER FOR IRRIGATION IR SOLN
Status: DC | PRN
  Administered 2023-09-15: 1000 mL

## 2023-09-15 MED ORDER — SODIUM BICARBONATE 8.4 % IV SOLN
INTRAVENOUS | Status: DC | PRN
  Administered 2023-09-15 (×3): 5 mL via EPIDURAL

## 2023-09-15 MED ORDER — HYDROMORPHONE HCL 1 MG/ML IJ SOLN: 0.2000 mg | INTRAMUSCULAR | Status: DC | PRN

## 2023-09-15 MED ORDER — OXYTOCIN-SODIUM CHLORIDE 30-0.9 UT/500ML-% IV SOLN: 2.5000 [IU]/h | INTRAVENOUS | Status: AC

## 2023-09-15 MED ORDER — FENTANYL CITRATE (PF) 100 MCG/2ML IJ SOLN
INTRAMUSCULAR | Status: DC | PRN
  Administered 2023-09-15: 100 ug via INTRATHECAL

## 2023-09-15 MED ORDER — KETOROLAC TROMETHAMINE 30 MG/ML IJ SOLN
30.0000 mg | Freq: Four times a day (QID) | INTRAMUSCULAR | Status: AC
  Administered 2023-09-16 (×3): 30 mg via INTRAVENOUS
  Filled 2023-09-15 (×3): qty 1

## 2023-09-15 MED ORDER — METOCLOPRAMIDE HCL 5 MG/ML IJ SOLN
INTRAMUSCULAR | Status: DC | PRN
  Administered 2023-09-15: 10 mg via INTRAVENOUS

## 2023-09-15 MED ORDER — OXYTOCIN-SODIUM CHLORIDE 30-0.9 UT/500ML-% IV SOLN
INTRAVENOUS | Status: AC
Start: 2023-09-15 — End: ?
  Filled 2023-09-15: qty 500

## 2023-09-15 MED ORDER — MORPHINE SULFATE (PF) 0.5 MG/ML IJ SOLN
INTRAMUSCULAR | Status: DC | PRN
  Administered 2023-09-15: 3 mg via EPIDURAL

## 2023-09-15 MED ORDER — TETANUS-DIPHTH-ACELL PERTUSSIS 5-2.5-18.5 LF-MCG/0.5 IM SUSY: 0.5000 mL | PREFILLED_SYRINGE | Freq: Once | INTRAMUSCULAR | Status: DC

## 2023-09-15 MED ORDER — SIMETHICONE 80 MG PO CHEW: 80.0000 mg | CHEWABLE_TABLET | ORAL | Status: DC | PRN

## 2023-09-15 MED ORDER — IBUPROFEN 600 MG PO TABS
600.0000 mg | ORAL_TABLET | Freq: Four times a day (QID) | ORAL | Status: DC
  Administered 2023-09-16 – 2023-09-17 (×3): 600 mg via ORAL
  Filled 2023-09-15 (×3): qty 1

## 2023-09-15 MED ORDER — LIDOCAINE-EPINEPHRINE (PF) 2 %-1:200000 IJ SOLN
INTRAMUSCULAR | Status: AC
  Filled 2023-09-15: qty 20

## 2023-09-15 SURGICAL SUPPLY — 34 items
BENZOIN TINCTURE PRP APPL 2/3 (GAUZE/BANDAGES/DRESSINGS) IMPLANT
CHLORAPREP W/TINT 26 (MISCELLANEOUS) ×2 IMPLANT
CLAMP UMBILICAL CORD (MISCELLANEOUS) ×1 IMPLANT
CLOTH BEACON ORANGE TIMEOUT ST (SAFETY) ×1 IMPLANT
DERMABOND ADVANCED .7 DNX12 (GAUZE/BANDAGES/DRESSINGS) ×2 IMPLANT
DRSG OPSITE POSTOP 4X10 (GAUZE/BANDAGES/DRESSINGS) ×1 IMPLANT
ELECT REM PT RETURN 9FT ADLT (ELECTROSURGICAL) ×1
ELECTRODE REM PT RTRN 9FT ADLT (ELECTROSURGICAL) ×1 IMPLANT
EXTRACTOR VACUUM KIWI (MISCELLANEOUS) ×1 IMPLANT
GAUZE PAD ABD 7.5X8 STRL (GAUZE/BANDAGES/DRESSINGS) IMPLANT
GAUZE SPONGE 4X4 12PLY STRL LF (GAUZE/BANDAGES/DRESSINGS) IMPLANT
GLOVE BIOGEL PI IND STRL 7.0 (GLOVE) ×2 IMPLANT
GLOVE BIOGEL PI IND STRL 7.5 (GLOVE) ×2 IMPLANT
GLOVE ECLIPSE 7.5 STRL STRAW (GLOVE) ×1 IMPLANT
GOWN STRL REUS W/TWL LRG LVL3 (GOWN DISPOSABLE) ×3 IMPLANT
KIT ABG SYR 3ML LUER SLIP (SYRINGE) IMPLANT
NDL HYPO 25X5/8 SAFETYGLIDE (NEEDLE) IMPLANT
NEEDLE HYPO 25X5/8 SAFETYGLIDE (NEEDLE) IMPLANT
NS IRRIG 1000ML POUR BTL (IV SOLUTION) ×1 IMPLANT
PACK C SECTION WH (CUSTOM PROCEDURE TRAY) ×1 IMPLANT
PAD OB MATERNITY 4.3X12.25 (PERSONAL CARE ITEMS) ×1 IMPLANT
RTRCTR C-SECT PINK 25CM LRG (MISCELLANEOUS) ×1 IMPLANT
SPONGE T-LAP 18X18 ~~LOC~~+RFID (SPONGE) IMPLANT
STRIP CLOSURE SKIN 1/2X4 (GAUZE/BANDAGES/DRESSINGS) IMPLANT
SUT MNCRL 0 VIOLET CTX 36 (SUTURE) ×2 IMPLANT
SUT MNCRL AB 3-0 PS2 27 (SUTURE) ×1 IMPLANT
SUT MNCRL AB 4-0 PS2 18 (SUTURE) ×1 IMPLANT
SUT VIC AB 0 CT1 27XBRD ANBCTR (SUTURE) ×1 IMPLANT
SUT VIC AB 0 CTX36XBRD ANBCTRL (SUTURE) ×1 IMPLANT
SUT VIC AB 2-0 CT1 TAPERPNT 27 (SUTURE) ×1 IMPLANT
SUT VIC AB 4-0 PS2 27 (SUTURE) ×1 IMPLANT
TOWEL OR 17X24 6PK STRL BLUE (TOWEL DISPOSABLE) ×1 IMPLANT
TRAY FOLEY W/BAG SLVR 14FR LF (SET/KITS/TRAYS/PACK) ×1 IMPLANT
WATER STERILE IRR 1000ML POUR (IV SOLUTION) ×1 IMPLANT

## 2023-09-15 NOTE — Transfer of Care (Signed)
Immediate Anesthesia Transfer of Care Note  Patient: Jessica Hendrix  Procedure(s) Performed: CESAREAN SECTION  Patient Location: PACU  Anesthesia Type:Epidural  Level of Consciousness: awake, alert , and oriented  Airway & Oxygen Therapy: Patient Spontanous Breathing  Post-op Assessment: Report given to RN and Post -op Vital signs reviewed and stable  Post vital signs: Reviewed and stable  Last Vitals:  Vitals Value Taken Time  BP 118/46 09/15/23 1818  Temp    Pulse 102 09/15/23 1820  Resp 18 09/15/23 1820  SpO2 99 % 09/15/23 1820  Vitals shown include unfiled device data.  Last Pain:  Vitals:   09/15/23 1601  TempSrc: Oral  PainSc: 0-No pain         Complications: No notable events documented.

## 2023-09-15 NOTE — Discharge Summary (Signed)
Postpartum Discharge Summary  Date of Service updated***     Patient Name: Jessica Hendrix DOB: [birth year] MRN: [id number]  Date of admission: 09/14/2023 Delivery date:09/15/2023 Delivering provider: Warden Fillers Date of discharge: 09/15/2023  Admitting diagnosis: Pregnancy [Z34.90] Intrauterine pregnancy: [redacted]w[redacted]d     Secondary diagnosis:  Active Problems:   S/P cesarean section  Additional problems: Asthma    Discharge diagnosis: Term Pregnancy Delivered                                              Post partum procedures:{Postpartum procedures:23558} Augmentation: AROM, Pitocin, and IP Foley Complications: None  Hospital course: Induction of Labor With Cesarean Section   22 y.o. yo G1P0000 at [redacted]w[redacted]d was admitted to the hospital 09/14/2023 for induction of labor for oligohydramnios. Patient had a labor course significant for arrest of dilation. The patient went for cesarean section due to Arrest of Dilation. Delivery details are as follows: Membrane Rupture Time/Date: 11:21 PM,09/14/2023  Delivery Method:C-Section, Low Transverse Operative Delivery:N/A Details of operation can be found in separate operative Note.  Patient had a postpartum course complicated by***. She is ambulating, tolerating a regular diet, passing flatus, and urinating well.  Patient is discharged home in stable condition on 09/15/23.      Newborn Data: Birth date:09/15/2023 Birth time:5:21 PM Gender:Female Living status:Living Apgars:8 ,9  Weight:2890 g                               Magnesium Sulfate received: {Mag received:30440022} BMZ received: No Rhophylac:N/A MMR:N/A T-DaP:{Tdap:23962}-ordered Flu: No RSV Vaccine received: No Transfusion:{Transfusion received:30440034}  Immunizations received: Immunization History  Administered Date(s) Administered   Hepatitis A 07/13/2016   Hpv-Unspecified 07/13/2016, 12/12/2016, 05/01/2017   Influenza,inj,Quad PF,6+ Mos 08/01/2019    Influenza-Unspecified 07/13/2016, 08/01/2017   Meningococcal Acwy, Unspecified 06/17/2020   Meningococcal Mcv4o 06/17/2020   PFIZER(Purple Top)SARS-COV-2 Vaccination 02/22/2020, 03/16/2020    Physical exam  Vitals:   09/15/23 1145 09/15/23 1146 09/15/23 1601 09/15/23 1631  BP: 134/63 118/71 120/65 126/62  Pulse: 87 75 87 82  Resp:   16   Temp:   98.5 F (36.9 C)   TempSrc:   Oral   SpO2:   100%   Weight:      Height:       General: {Exam; general:21111117} Lochia: {Desc; appropriate/inappropriate:30686::"appropriate"} Uterine Fundus: {Desc; firm/soft:30687} Incision: {Exam; incision:21111123} DVT Evaluation: {Exam; dvt:2111122} Labs: Lab Results  Component Value Date   WBC 15.0 (H) 09/15/2023   HGB 11.3 (L) 09/15/2023   HCT 34.7 (L) 09/15/2023   MCV 82.0 09/15/2023   PLT 120 (L) 09/15/2023      Latest Ref Rng & Units 07/30/2023   11:22 PM  CMP  Glucose 70 - 99 mg/dL 87   BUN 6 - 20 mg/dL 10   Creatinine 4.09 - 1.00 mg/dL 8.11   Sodium 914 - 782 mmol/L 135   Potassium 3.5 - 5.1 mmol/L 3.8   Chloride 98 - 111 mmol/L 106   CO2 22 - 32 mmol/L 22   Calcium 8.9 - 10.3 mg/dL 9.1   Total Protein 6.5 - 8.1 g/dL 6.1   Total Bilirubin 0.3 - 1.2 mg/dL 0.5   Alkaline Phos 38 - 126 U/L 97   AST 15 - 41 U/L 15   ALT 0 -  44 U/L 17    Edinburgh Score:     No data to display         No data recorded  After visit meds:  Allergies as of 09/15/2023       Reactions   Cinnamon    spices   Penicillins Other (See Comments)   unk   Citrus Rash     Med Rec must be completed prior to using this SMARTLINK***        Discharge home in stable condition Infant Feeding: Breast Infant Disposition:{CHL IP OB HOME WITH QIONGE:95284} Discharge instruction: per After Visit Summary and Postpartum booklet. Activity: Advance as tolerated. Pelvic rest for 6 weeks.  Diet: routine diet Future Appointments: Future Appointments  Date Time Provider Department Center  09/18/2023   4:10 PM Lennart Pall, MD [facility] None   Follow up Visit:  Message sent by [facility] 12/20  Please schedule this patient for a In person postpartum visit in 6 weeks with the following provider: Any provider. Additional Postpartum F/U:Incision check 1 week  Low risk pregnancy complicated by:  oligohydramnios, asthma, gestational thrombocytopenia Delivery mode:  C-Section, Low Transverse Anticipated Birth Control:  Unsure- considering copper IUD   09/15/2023 Lennart Pall, MD

## 2023-09-15 NOTE — Progress Notes (Signed)
Labor Progress Note Jessica Hendrix is a 22 y.o. G1P0000 at [redacted]w[redacted]d presented for  IOL due to oligohydramnios.   S: In to meet patient, no questions at this time.   O:  BP 134/71   Pulse 72   Temp 98.8 F (37.1 C) (Oral)   Resp 18   Ht 5\' 7"  (1.702 m)   Wt 99.8 kg   LMP 12/23/2022   SpO2 100%   BMI 34.46 kg/m  EFM: 125/mod/+a/-d  CVE: Dilation: 6 Effacement (%): 80 Station: -2 Presentation: Vertex Exam by:: Jessica Hendrix, RNC   A&P: 22 y.o. G1P0000 [redacted]w[redacted]d here for IOL due to oligohydramnios  #Labor: On Pitocin, continuing to titrate to regular ctx  anticipate SVD #Pain: Per pt request #FWB: Cat 1 #GBS  unknown> Ancef   given GA  #Oligohydramnios  #Gestational thrombocytopenia: BL 120s  Sundra Aland, MD 10:25 AM

## 2023-09-15 NOTE — Anesthesia Procedure Notes (Signed)
Epidural Patient location during procedure: OB Start time: 09/15/2023 11:31 AM End time: 09/15/2023 11:48 AM  Staffing Anesthesiologist: Val Eagle, MD Performed: anesthesiologist   Preanesthetic Checklist Completed: patient identified, IV checked, risks and benefits discussed, monitors and equipment checked, pre-op evaluation and timeout performed  Epidural Patient position: sitting Prep: DuraPrep Patient monitoring: heart rate, continuous pulse ox and blood pressure Approach: midline Location: L4-L5 Injection technique: LOR saline  Needle:  Needle type: Tuohy  Needle gauge: 17 G Needle length: 9 cm Needle insertion depth: 9 cm Catheter type: closed end flexible Catheter size: 19 Gauge Catheter at skin depth: 16 cm Test dose: negative and 2% lidocaine with Epi 1:200 K  Assessment Events: blood not aspirated, no cerebrospinal fluid, injection not painful, no injection resistance, no paresthesia and negative IV test

## 2023-09-15 NOTE — Progress Notes (Signed)
Labor Progress Note Jessica Hendrix is a 22 y.o. G1P0000 at [redacted]w[redacted]d presented for IOL due to oligohydramnios.  S: Feeling lightheaded and woozy. BP ok. Using IV pain meds. Used the breast pump for a few minutes but got a bit sleepy.  O:  BP 121/64   Pulse 73   Temp 98 F (36.7 C) (Oral)   Resp 15   Ht 5\' 7"  (1.702 m)   Wt 99.8 kg   LMP 12/23/2022   SpO2 100%   BMI 34.46 kg/m  EFM: 125bpm/moderate variability/+ accels/no decels  CVE: Dilation: 6 Effacement (%): 70, 80 Station: -2 Presentation: Vertex Exam by:: K.Louis-Charles, RN   A&P: 22 y.o. G1P0000 [redacted]w[redacted]d here for IOL due to oligohydramnios #Labor: Progressing well. Wants to hold off on pitocin for now since doing well on her own; will continue breast pumping intermittently.  Feeling some lightheadedness but BP ok. Has light labour diet. Will hold on her fentanyl now given her sx. She states she is able to tolerate for right now. Will reassess in about 1 hour. #Pain: IV fentanyl, thinking about epidural #FWB: Cat 1 #GBS  pending>Ancef   Gwenlyn Perking, MD 4:24 AM

## 2023-09-15 NOTE — Progress Notes (Signed)
Decision to CS Note  After our previously documented discussion, patient would like to proceed with cesarean section for arrest of dilation given unchanged exam of 6-7cm for >6 hours.   The risks of surgery were discussed with the patient including but were not limited to: bleeding which may require transfusion, reoperation, or additional procedures like hysterectomy in the event of life-threatening hemorrhage; infection which may require antibiotics; injury to bowel, bladder, ureters or other surrounding organs; injury to the fetus; formation of adhesions; placental abnormalities with subsequent pregnancies; incisional problems; thromboembolic phenomenon and other postoperative/anesthesia complications.    The patient concurred with the proposed plan, giving informed written consent for the procedure. Patient to remain NPO. Anesthesia and OR aware. Preoperative prophylactic antibiotics and SCDs ordered on call to the OR. To OR when ready.  Harvie Bridge, MD Obstetrician & Gynecologist, [facility] for [employer], [facility]

## 2023-09-15 NOTE — Progress Notes (Signed)
Labor Progress Note  In to see patient.   Patient has been 6-7cm since 55. SCE previously performed by Dr. Lucianne Muss around 1230 likely unchanged (different examiner). IUPC placed at that time. Cesarean delivery had not been discussed with the patient, so I returned to evaluate patient.   On my exam, SCE 6/90/-2 consistent with prior exams. Caput noted. Based on her unchanged exam for the past 10 hours, I discussed cesarean delivery for arrest of dilation. Discussed that the longer we go without cervical change, vaginal birth becomes less likely but risk of infection and postpartum hemorrhage rises. FHR is cat I. Patient would like a little more time to consider her options.   Plan to reevaluate the patient around 1500. If no cervical change at that time, plan for cesarean delivery.   Harvie Bridge, MD Obstetrician & Gynecologist, [facility] for [employer], [facility]

## 2023-09-15 NOTE — Anesthesia Preprocedure Evaluation (Addendum)
Anesthesia Evaluation  Patient identified by MRN, date of birth, ID band Patient awake    Reviewed: Allergy & Precautions, NPO status , Patient's Chart, lab work & pertinent test results  History of Anesthesia Complications Negative for: history of anesthetic complications  Airway Mallampati: III  TM Distance: >3 FB Neck ROM: Full    Dental  (+) Dental Advisory Given   Pulmonary asthma    breath sounds clear to auscultation       Cardiovascular negative cardio ROS  Rhythm:Regular     Neuro/Psych  PSYCHIATRIC DISORDERS Anxiety     negative neurological ROS     GI/Hepatic negative GI ROS, Neg liver ROS,,,  Endo/Other  negative endocrine ROS    Renal/GU negative Renal ROSLab Results      Component                Value               Date                      CREATININE               0.75                07/30/2023                Musculoskeletal   Abdominal   Peds  Hematology  (+) Blood dyscrasia, anemia Lab Results      Component                Value               Date                      WBC                      15.0 (H)            09/15/2023                HGB                      11.3 (L)            09/15/2023                HCT                      34.7 (L)            09/15/2023                MCV                      82.0                09/15/2023                PLT                      120 (L)             09/15/2023              Anesthesia Other Findings   Reproductive/Obstetrics (+) Pregnancy  Anesthesia Physical Anesthesia Plan  ASA: 2  Anesthesia Plan: Epidural   Post-op Pain Management: Ofirmev IV (intra-op)* and Toradol IV (intra-op)*   Induction:   PONV Risk Score and Plan: 2 and Ondansetron and Dexamethasone  Airway Management Planned: Natural Airway, Nasal Cannula and Simple Face Mask  Additional Equipment: None  Intra-op Plan:    Post-operative Plan:   Informed Consent: I have reviewed the patients History and Physical, chart, labs and discussed the procedure including the risks, benefits and alternatives for the proposed anesthesia with the patient or authorized representative who has indicated his/her understanding and acceptance.     Dental advisory given  Plan Discussed with: CRNA  Anesthesia Plan Comments:        Anesthesia Quick Evaluation

## 2023-09-15 NOTE — Progress Notes (Signed)
Labor Progress Note Jessica Hendrix is a 22 y.o. G1P0000 at [redacted]w[redacted]d presented for  IOL due to oligohydramnios.   S: Some discomfort with contractions. Able to rest some  O:  BP 120/76   Pulse 80   Temp 98.2 F (36.8 C) (Oral)   Resp 15   Ht 5\' 7"  (1.702 m)   Wt 99.8 kg   LMP 12/23/2022   SpO2 100%   BMI 34.46 kg/m  EFM: 130bpm/moderate variability/+ accels/no decels   CVE: Dilation: 6 Effacement (%): 70, 80 Station: -2 Presentation: Vertex Exam by:: K.Louis-Charles, RN   A&P: 22 y.o. G1P0000 [redacted]w[redacted]d here for IOL due to oligohydramnios  #Labor: Ctx spacing out. She is agreeable to starting pitocin to help with augmentation of labor. Start 2x2 pitocin protocol. #Pain: IV fentanyl, requesting epidural #FWB: Cat 1 #GBS  unknown> Ancef   Gwenlyn Perking, MD 7:20 AM

## 2023-09-15 NOTE — Progress Notes (Signed)
Labor Progress Note Jessica Hendrix is a 22 y.o. G1P0000 at [redacted]w[redacted]d presented for  IOL due to oligohydramnios.   S: Pt denies any complaints or concerns at this time.  O:  BP 118/71   Pulse 75   Temp 98.8 F (37.1 C) (Oral)   Resp 18   Ht 5\' 7"  (1.702 m)   Wt 99.8 kg   LMP 12/23/2022   SpO2 100%   BMI 34.46 kg/m  EFM: 125/mod/-a/-d  CVE: Dilation: 6 Effacement (%): 80 Station: -2 Presentation: Vertex Exam by:: Blima Singer RN   A&P: 22 y.o. G1P0000 [redacted]w[redacted]d here for IOL due to oligohydramnios  #Labor: Discussed placement of IUPC to better assess ctx pattern  will reassess for cervical dilation -- has been unchanged ~10h, Dr. Berton Lan to re-eval pt and discuss possible Cesarean delivery for arrest of dilation #Pain: Epidural #FWB: Cat 1 #GBS  unknown> Ancef   given GA  #Oligohydramnios  #Gestational thrombocytopenia: BL 120s  Sundra Aland, MD 1:02 PM

## 2023-09-15 NOTE — Op Note (Cosign Needed Addendum)
Orlin Hilding PROCEDURE DATE: 09/15/2023  PREOPERATIVE DIAGNOSES: Intrauterine pregnancy at [redacted]w[redacted]d weeks gestation; failure to progress: arrest of dilation  POSTOPERATIVE DIAGNOSES: The same  PROCEDURE: Primary Low Transverse Cesarean Section  SURGEON:  Dr. Harvie Bridge and Dr. Mariel Aloe  ASSISTANT:  Sundra Aland, MD  ANESTHESIOLOGY TEAM: Anesthesiologist: Val Eagle, MD CRNA: Graciela Husbands, CRNA  INDICATIONS: Jessica Hendrix is a 22 y.o. G1P0101 at [redacted]w[redacted]d here for cesarean section secondary to the indications listed under preoperative diagnoses; please see preoperative note for further details.  The risks of surgery were discussed with the patient including but were not limited to: bleeding which may require transfusion or reoperation; infection which may require antibiotics; injury to bowel, bladder, ureters or other surrounding organs; injury to the fetus; need for additional procedures including hysterectomy in the event of a life-threatening hemorrhage; formation of adhesions; placental abnormalities wth subsequent pregnancies; incisional problems; thromboembolic phenomenon and other postoperative/anesthesia complications.  The patient concurred with the proposed plan, giving informed written consent for the procedure.    FINDINGS:  Viable female infant in cephalic presentation.  Apgars 8 and 9.  Clear amniotic fluid.  Intact placenta, three vessel cord.  Normal uterus, fallopian tubes and ovaries bilaterally.  ANESTHESIA: Epidural  INTRAVENOUS FLUIDS: 1000 ml   ESTIMATED BLOOD LOSS: 760 ml URINE OUTPUT:  150 ml SPECIMENS: Placenta sent to L&D COMPLICATIONS: None immediate  PROCEDURE IN DETAIL:  The patient preoperatively received intravenous antibiotics and had sequential compression devices applied to her lower extremities.  She was then taken to the operating room where the epidural anesthesia was dosed up to surgical level and was found to be adequate. She was  then placed in a dorsal supine position with a leftward tilt, and prepped and draped in a sterile manner.  A foley catheter was was already in place.  After an adequate timeout was performed, a Pfannenstiel skin incision was made with scalpel two fingerbreaths above the pubic symphysis and carried through to the underlying layer of fascia. The fascia was incised in the midline, and this incision was extended bilaterally using the Mayo scissors.  Kocher clamps x 2 were applied to the superior aspect of the fascial incision and the underlying rectus muscles were dissected off bluntly. The rectus muscles were separated in the midline and the peritoneum was entered bluntly. The Alexis self-retaining retractor was introduced into the abdominal cavity. Attention was turned to the lower uterine segment where a low transverse hysterotomy was made with a scalpel and extended bilaterally bluntly.  The infant was successfully delivered, the cord was clamped and cut after one minute, and the infant was handed over to the awaiting neonatology team. Uterine massage was then administered, and the placenta delivered intact with a three-vessel cord. The uterus was then cleared of clots and debris using manual curettage.  The uterine incision was closed with 0 Monocryl in a running fashion, and an imbricating layer was also placed with 0 Monocryl. The pelvis was cleared of all clot and debris. Hemostasis was confirmed on all surfaces.  The retractor was removed.  The peritoneum was closed with a 2-0 Vicryl running stitch. The fascia was then closed using 0 Vicryl in a running fashion.  The subcutaneous layer was irrigated and hemostasis was achieved using electrocautery. The skin was closed with a 4-0 Vicryl subcuticular stitch. The patient tolerated the procedure well. Sponge, instrument and needle counts were correct x 3.  She was taken to the recovery room in stable condition.  Sundra Aland, MD OB Fellow, Faculty  [facility], Center for [facility]

## 2023-09-15 NOTE — Lactation Note (Signed)
This note was copied from a baby's chart. Lactation Consultation Note  Patient Name: Jessica Hendrix [id number] Date: 09/15/2023 Age:20 hours Reason for consult: Initial assessment;Primapara;Late-preterm 34-36.6wks Mom holding baby. Mom stated she is waiting on her support person. Mom stated the baby has latched. She is doing BF/formula feeding. Mom has Plan of Care feeding card. LC gave mom LPI information sheet and reviewed feedings. Encouraged mom to pump w/DEBP for extra stimulation. Mom declined at this time because she is tired and wants to sleep. Mom stated maybe tomorrow set it up. LPI feeding habits, STS reviewed. Mom encouraged to feed baby 8-12 times/24 hours and with feeding cues.  Asked mom to call for [facility] assistance w/latching or questions.   Maternal Data Has patient been taught Hand Expression?: No Does the patient have breastfeeding experience prior to this delivery?: No  Feeding Nipple Type: Slow - flow  LATCH Score       Type of Nipple: Everted at rest and after stimulation  Comfort (Breast/Nipple): Soft / non-tender         Lactation Tools Discussed/Used Tools:  (mom declined pump at this time)  Interventions Interventions: Education;LC Services brochure;LPT handout/interventions  Discharge    Consult Status      Charyl Dancer 09/15/2023, 10:31 PM

## 2023-09-16 ENCOUNTER — Encounter (HOSPITAL_COMMUNITY): Payer: Self-pay | Admitting: Obstetrics and Gynecology

## 2023-09-16 LAB — CBC
HCT: 29.7 % — ABNORMAL LOW (ref 36.0–46.0)
Hemoglobin: 9.7 g/dL — ABNORMAL LOW (ref 12.0–15.0)
MCH: 26.9 pg (ref 26.0–34.0)
MCHC: 32.7 g/dL (ref 30.0–36.0)
MCV: 82.5 fL (ref 80.0–100.0)
Platelets: 124 10*3/uL — ABNORMAL LOW (ref 150–400)
RBC: 3.6 MIL/uL — ABNORMAL LOW (ref 3.87–5.11)
RDW: 13.9 % (ref 11.5–15.5)
WBC: 18.8 10*3/uL — ABNORMAL HIGH (ref 4.0–10.5)
nRBC: 0 % (ref 0.0–0.2)

## 2023-09-16 MED ORDER — NALBUPHINE HCL 10 MG/ML IJ SOLN
5.0000 mg | INTRAMUSCULAR | Status: AC
  Administered 2023-09-16: 5 mg via INTRAVENOUS
  Filled 2023-09-16: qty 1

## 2023-09-16 MED ORDER — HYDROXYZINE HCL 25 MG PO TABS
25.0000 mg | ORAL_TABLET | Freq: Three times a day (TID) | ORAL | Status: DC | PRN
  Administered 2023-09-16 – 2023-09-17 (×2): 25 mg via ORAL
  Filled 2023-09-16 (×2): qty 1

## 2023-09-16 MED ORDER — HYDROCORTISONE 0.5 % EX CREA
TOPICAL_CREAM | Freq: Two times a day (BID) | CUTANEOUS | Status: DC | PRN
  Filled 2023-09-16 (×2): qty 28.35

## 2023-09-16 MED ORDER — NALBUPHINE HCL 10 MG/ML IJ SOLN: 5.0000 mg | Freq: Four times a day (QID) | INTRAMUSCULAR | Status: DC | PRN

## 2023-09-16 NOTE — Progress Notes (Signed)
MOB was referred for history of depression/anxiety. * Referral screened out by Clinical Social Worker because none of the following criteria appear to apply: ~ History of anxiety/depression during this pregnancy, or of post-partum depression following prior delivery. ~ Diagnosis of anxiety and/or depression within last 3 years. MOB was last diagnosed with Anxiety 03/08/2020 and Depression 12/05/2019. No mental health concerns noted since above dates. OR * MOB's symptoms currently being treated with medication and/or therapy. Please contact the Clinical Social Worker if needs arise, by [facility] request, or if MOB scores greater than 9/yes to question 10 on [place] Postpartum Depression Screen.  Signed,  Norberto Sorenson, MSW, LCSWA, LCASA 09/16/2023 10:42 AM

## 2023-09-16 NOTE — Progress Notes (Signed)
POSTPARTUM PROGRESS NOTE  Post Partum Day 1  Subjective:  Jessica Hendrix is a 22 y.o. G1P0101 s/p LTCS at [redacted]w[redacted]d.  No acute events overnight.  Pt denies problems with ambulating, voiding or po intake.  She denies nausea or vomiting.  Pain is well controlled.  She has had flatus. She has not had bowel movement.  Lochia moderate.   Objective: Blood pressure 134/75, pulse 64, temperature 98.1 F (36.7 C), temperature source Oral, resp. rate 16, height 5\' 7"  (1.702 m), weight 99.8 kg, last menstrual period 12/23/2022, SpO2 100%, unknown if currently breastfeeding.  Physical Exam:  General: alert, cooperative and no distress Chest: no respiratory distress Heart:regular rate, distal pulses intact Abdomen: soft, nontender,  Uterine Fundus: firm, appropriately tender DVT Evaluation: No calf swelling or tenderness Extremities: trace edema Skin: warm, dry; incision clean/dry/intact, under dressing.   Recent Labs    09/15/23 1941 09/16/23 0529  HGB 9.9* 9.7*  HCT 30.4* 29.7*    Assessment/Plan: Jessica Hendrix is a 22 y.o. G1P0101 s/p LTCS at [redacted]w[redacted]d for FTP at 6-7 cm for > 6 hours.   PPD#1 - Doing well Contraception: Considering CuIUD at [facility] visit Feeding: Breast/bottle Dispo: Plan for discharge tomorrow 09/17/23.   LOS: 2 days   Richardson Landry, CNM 09/16/2023, 11:22 AM

## 2023-09-16 NOTE — Progress Notes (Signed)
MOB says she is still itching- says it's very bothersome. About a 9/10 on severity scale,

## 2023-09-16 NOTE — Anesthesia Postprocedure Evaluation (Signed)
Anesthesia Post Note  Patient: Jessica Hendrix  Procedure(s) Performed: CESAREAN SECTION     Patient location during evaluation: Mother Baby Anesthesia Type: Epidural Level of consciousness: awake and alert and oriented Pain management: satisfactory to patient Vital Signs Assessment: post-procedure vital signs reviewed and stable Respiratory status: respiratory function stable Cardiovascular status: stable Postop Assessment: no headache, no backache, epidural receding, patient able to bend at knees, no signs of nausea or vomiting, adequate PO intake and able to ambulate Anesthetic complications: no   No notable events documented.  Last Vitals:  Vitals:   09/16/23 0446 09/16/23 0941  BP: 129/74 134/75  Pulse: 60 64  Resp:  16  Temp: 37.1 C 36.7 C  SpO2: 100% 100%    Last Pain:  Vitals:   09/16/23 0941  TempSrc: Oral  PainSc: 0-No pain   Pain Goal:                Epidural/Spinal Function Cutaneous sensation: Normal sensation (09/16/23 0941), Patient able to flex knees: Yes (09/16/23 0941), Patient able to lift hips off bed: Yes (09/16/23 0941), Back pain beyond tenderness at insertion site: No (09/16/23 0941), Progressively worsening motor and/or sensory loss: No (09/16/23 0941), Bowel and/or bladder incontinence post epidural: No (09/16/23 0941)  Carlos American

## 2023-09-16 NOTE — Lactation Note (Signed)
This note was copied from a baby's chart. Lactation Consultation Note  Patient Name: Jessica Hendrix [id number] Date: 09/16/2023 Age:20 hours Reason for consult: Follow-up assessment;Primapara;Late-preterm 34-36.6wks Mom didn't want to pump last night because she was tired and wanted to go to sleep. LC asked mom if LC could set up DEBP for her and mom would pump every 3 hrs d/t baby LPI for stimulation. Mom agreed. Mom shown how to use DEBP & how to disassemble, clean, & reassemble parts. Mom disappointed because she didn't get anything when she pumped.  Explained normal. Praised mom for her BF baby before giving formula. Reviewed supplemental guidelines again.  Demonstrated pace feeding. Mom has no questions. Encouraged to call for assistance or questions.  Maternal Data    Feeding Mother's Current Feeding Choice: Breast Milk and Formula Nipple Type: Nfant Standard Flow (white)  LATCH Score Latch: Grasps breast easily, tongue down, lips flanged, rhythmical sucking.  Audible Swallowing: A few with stimulation  Type of Nipple: Everted at rest and after stimulation  Comfort (Breast/Nipple): Soft / non-tender  Hold (Positioning): Assistance needed to correctly position infant at breast and maintain latch.  LATCH Score: 8   Lactation Tools Discussed/Used Tools: Pump;Flanges Flange Size: 18;21 Breast pump type: Double-Electric Breast Pump Pump Education: Setup, frequency, and cleaning Reason for Pumping: LPI Pumping frequency: Q 3 hr  Interventions Interventions: Breast feeding basics reviewed;Assisted with latch;Skin to skin;Breast massage;Hand express;Breast compression;Adjust position;Support pillows;Position options;DEBP;Pace feeding;Education  Discharge    Consult Status Consult Status: Follow-up Date: 07/18/23 Follow-up type: In-patient    Charyl Dancer 09/16/2023, 10:43 PM

## 2023-09-17 LAB — STREP GP B CULTURE+RFLX: Strep Gp B Culture+Rflx: NEGATIVE

## 2023-09-17 MED ORDER — SENNOSIDES-DOCUSATE SODIUM 8.6-50 MG PO TABS: 2.0000 | ORAL_TABLET | Freq: Every day | ORAL | 0 refills | Status: DC

## 2023-09-17 MED ORDER — HYDROXYZINE HCL 25 MG PO TABS: 25.0000 mg | ORAL_TABLET | Freq: Three times a day (TID) | ORAL | 0 refills | Status: DC | PRN

## 2023-09-17 MED ORDER — ACETAMINOPHEN 500 MG PO TABS: 1000.0000 mg | ORAL_TABLET | Freq: Four times a day (QID) | ORAL | 0 refills | Status: DC

## 2023-09-17 MED ORDER — OXYCODONE HCL 5 MG PO TABS: 5.0000 mg | ORAL_TABLET | ORAL | 0 refills | Status: AC | PRN

## 2023-09-17 MED ORDER — IBUPROFEN 600 MG PO TABS: 600.0000 mg | ORAL_TABLET | Freq: Four times a day (QID) | ORAL | 0 refills | Status: DC

## 2023-09-17 NOTE — Progress Notes (Signed)
Desires an abdominal binder for use during the day.

## 2023-09-18 ENCOUNTER — Encounter: Payer: 59 | Admitting: Obstetrics and Gynecology

## 2023-09-18 ENCOUNTER — Inpatient Hospital Stay (HOSPITAL_COMMUNITY)
Admission: AD | Admit: 2023-09-18 | Discharge: 2023-09-19 | Disposition: A | Discharge status: Home / Self Care | Payer: 59 | Attending: Obstetrics and Gynecology | Admitting: Obstetrics and Gynecology

## 2023-09-18 DIAGNOSIS — G8918 Other acute postprocedural pain: Secondary | ICD-10-CM | POA: Insufficient documentation

## 2023-09-18 DIAGNOSIS — M549 Dorsalgia, unspecified: Secondary | ICD-10-CM

## 2023-09-18 DIAGNOSIS — O165 Unspecified maternal hypertension, complicating the puerperium: Secondary | ICD-10-CM | POA: Diagnosis present

## 2023-09-18 DIAGNOSIS — R609 Edema, unspecified: Secondary | ICD-10-CM | POA: Diagnosis not present

## 2023-09-18 DIAGNOSIS — O9089 Other complications of the puerperium, not elsewhere classified: Secondary | ICD-10-CM | POA: Diagnosis not present

## 2023-09-18 DIAGNOSIS — I10 Essential (primary) hypertension: Secondary | ICD-10-CM | POA: Diagnosis not present

## 2023-09-18 DIAGNOSIS — L7682 Other postprocedural complications of skin and subcutaneous tissue: Secondary | ICD-10-CM

## 2023-09-18 DIAGNOSIS — Z98891 History of uterine scar from previous surgery: Secondary | ICD-10-CM

## 2023-09-18 MED ORDER — ACETAMINOPHEN 500 MG PO TABS
1000.0000 mg | ORAL_TABLET | Freq: Once | ORAL | Status: AC
  Administered 2023-09-18: 1000 mg via ORAL
  Filled 2023-09-18: qty 2

## 2023-09-18 MED ORDER — CYCLOBENZAPRINE HCL 5 MG PO TABS
10.0000 mg | ORAL_TABLET | Freq: Once | ORAL | Status: AC
  Administered 2023-09-18: 10 mg via ORAL
  Filled 2023-09-18: qty 2

## 2023-09-18 MED ORDER — OXYCODONE HCL 5 MG PO TABS
5.0000 mg | ORAL_TABLET | Freq: Once | ORAL | Status: AC
  Administered 2023-09-18: 5 mg via ORAL
  Filled 2023-09-18: qty 1

## 2023-09-18 MED ORDER — IBUPROFEN 200 MG PO TABS
200.0000 mg | ORAL_TABLET | Freq: Once | ORAL | Status: AC
  Administered 2023-09-19: 200 mg via ORAL
  Filled 2023-09-18: qty 1

## 2023-09-18 NOTE — MAU Provider Note (Signed)
Chief Complaint:  Abdominal Pain and Back Pain   Event Date/Time   First Provider Initiated Contact with Patient 09/18/23 2304       HPI: Cheryllynn Tivnan is a 22 y.o. G1P0101 who presents to maternity admissions reporting ***. She reports vaginal bleeding, vaginal itching/burning, urinary symptoms, h/a, dizziness, n/v, or fever/chills.    HPI  Past Medical History: Past Medical History:  Diagnosis Date   Allergy    Anemia    Anxiety    Asthma    Constipation    Depression    Depression, major, single episode, moderate (HCC) 12/05/2019   GAD (generalized anxiety disorder) 03/08/2020   Pyelonephritis    Supervision of other normal pregnancy, antepartum 03/13/2023              NURSING     PROVIDER      Office Location    [facility]    Dating by    LMP c/w U/S at 7 wks      [facility] Model    Traditional    Anatomy U/S    incomplete      Initiated care at     [employer]     English                     LAB RESULTS       Support Person         Genetics    NIPS: LR  AFP:  neg                NT/IT (FT only)                     Carrier Screen       Twitch 12/05/2019   UTI (urinary tract infection)     Past obstetric history: OB History  Gravida Para Term Preterm AB Living  1 1 0 1 0 1  SAB IAB Ectopic Multiple Live Births  0 0 0 0 1    # Outcome Date GA Lbr Len/2nd Weight Sex Type Anes PTL Lv  1 Preterm 09/15/23 [redacted]w[redacted]d  2890 g M CS-LTranv EPI  LIV    Past Surgical History: Past Surgical History:  Procedure Laterality Date   CESAREAN SECTION N/A 09/15/2023   Procedure: CESAREAN SECTION;  Surgeon: Lennart Pall, MD;  Location: [facility] LD ORS;  Service: Obstetrics;  Laterality: N/A;   WISDOM TOOTH EXTRACTION      Family History: Family History  Problem Relation Age of Onset   Asthma Mother    Sleep apnea Mother    Obstructive Sleep Apnea Mother    Diabetes Father    Allergic rhinitis Neg Hx    Angioedema Neg Hx    Atopy Neg Hx    Eczema Neg Hx     Immunodeficiency Neg Hx    Urticaria Neg Hx     Social History: Social History   Tobacco Use   Smoking status: Never   Smokeless tobacco: Never  Vaping Use   Vaping status: Never Used  Substance Use Topics   Alcohol use: Not Currently   Drug use: Not Currently    Types: Marijuana    Comment: last use feb 2024    Allergies:  Allergies  Allergen Reactions   Cinnamon     spices   Penicillins  Other (See Comments)    unk   Citrus Rash    Meds:  Medications Prior to Admission  Medication Sig Dispense Refill Last Dose/Taking   acetaminophen (TYLENOL) 500 MG tablet Take 2 tablets (1,000 mg total) by mouth every 6 (six) hours. 30 tablet 0    Blood Pressure Monitoring (BLOOD PRESSURE KIT) DEVI 1 Device by Does not apply route once a week. (Patient not taking: Reported on 09/13/2023) 1 each 0    budesonide-formoterol (SYMBICORT) 80-4.5 MCG/ACT inhaler Inhale 2 puffs into the lungs every 6 (six) hours. (Patient not taking: Reported on 09/13/2023) 1 each 12    budesonide-formoterol (SYMBICORT) 80-4.5 MCG/ACT inhaler Inhale 2 puffs into the lungs every 6 (six) hours. (Patient not taking: Reported on 09/13/2023) 1 each 12    hydrOXYzine (ATARAX) 25 MG tablet Take 1 tablet (25 mg total) by mouth 3 (three) times daily as needed for itching (refractory itching after benadryl use). 30 tablet 0    ibuprofen (ADVIL) 600 MG tablet Take 1 tablet (600 mg total) by mouth every 6 (six) hours. 30 tablet 0    ondansetron (ZOFRAN) 4 MG tablet Take 2 tablets (8 mg total) by mouth every 8 (eight) hours as needed for nausea or vomiting. 180 tablet 0    oxyCODONE (OXY IR/ROXICODONE) 5 MG immediate release tablet Take 1 tablet (5 mg total) by mouth every 4 (four) hours as needed for up to 5 days for moderate pain (pain score 4-6). 20 tablet 0    Prenatal Vit-Fe Fumarate-FA (PRENATAL VITAMIN PO) Take 1 tablet by mouth daily.      senna-docusate (SENOKOT-S) 8.6-50 MG tablet Take 2 tablets by mouth daily. 60  tablet 0     I have reviewed patient's Past Medical Hx, Surgical Hx, Family Hx, Social Hx, medications and allergies.  ROS:  Review of Systems Other systems negative     Physical Exam  No data found. Constitutional: Well-developed, well-nourished female in no acute distress.  Cardiovascular: normal rate and rhythm, no ectopy audible, S1 & S2 heard, no murmur Respiratory: normal effort, no distress. Lungs CTAB with no wheezes or crackles GI: Abd soft, non-tender.  Nondistended.  No rebound, No guarding.  Bowel Sounds audible  MS: Extremities nontender, no edema, normal ROM Neurologic: Alert and oriented x 4.   Grossly nonfocal. GU: Neg CVAT. Skin:  Warm and Dry Psych:  Affect appropriate.  PELVIC EXAM: Cervix pink, visually closed, without lesion, scant white creamy discharge, vaginal walls and external genitalia normal Bimanual exam: Cervix firm, anterior, neg CMT, uterus nontender, nonenlarged, adnexa without tenderness, enlargement, or mass  Exam is limited by body habitus.    Labs: No results found for this or any previous visit (from the past 24 hours). --/--/O POS (12/19 1849)  Imaging:  [place] MFM OB FOLLOW UP Result Date: 09/14/2023 ----------------------------------------------------------------------  OBSTETRICS REPORT                       (Signed Final 09/14/2023 04:34 pm) ---------------------------------------------------------------------- Patient Info  ID #:       [id number]                          D.O.B.:  [birth year] (22 yrs)(F)  Name:       Altamese Cabal Boyan                Visit Date: 09/14/2023 03:23 pm ---------------------------------------------------------------------- Performed By  Attending:  Burk Schaible DO       Ref. Address:     [address]                                                             [address]                                                             [place]                                                              [id number]  Performed By:     Reinaldo Raddle            Secondary Phy.:   [facility] MAU/Triage                    RDMS  Referred By:      [facility]             Location[facility] ---------------------------------------------------------------------- Orders  #  Description                           Code        Ordered By  1  [place] MFM OB FOLLOW UP                   [id number]  Braxton Feathers ----------------------------------------------------------------------  #  Order #                     Accession #                Episode #  1  [id number]                   [id number]                 [id number] ---------------------------------------------------------------------- Indications  Obesity complicating pregnancy, third          O99.213  trimester  Asthma                                         O99.89 j45.909  LR NIPS/Neg Horizon/Neg AFP/2hr GTT wnl  Oligohydramnios / Decreased amniotic fluid     O41.00X0  volume  [redacted] weeks gestation of pregnancy                Z3A.36 ---------------------------------------------------------------------- Fetal Evaluation  Num Of Fetuses:         1  Fetal Heart Rate(bpm):  131  Cardiac Activity:       Observed  Presentation:           Cephalic  Placenta:               Anterior  P. Cord Insertion:      Previously seen  Amniotic Fluid  AFI FV:      Oligohydramnios  AFI Sum(cm)     %Tile       Largest Pocket(cm)  1.7             < 3         1.7  RUQ(cm)       RLQ(cm)       LUQ(cm)        LLQ(cm)  0             1.7           0              0 ---------------------------------------------------------------------- Biometry  BPD:      90.1  mm     G. Age:  36w 3d         57  %    CI:         79.1   %    70 - 86                                                           FL/HC:      21.4   %    20.8 - 22.6  HC:      320.3  mm     G. Age:  36w 1d         12  %    HC/AC:      1.03        0.92 - 1.05  AC:      312.4  mm     G. Age:  35w 1d         20  %    FL/BPD:     76.1   %  71 - 87  FL:       68.6  mm     G. Age:  35w 2d         14  %    FL/AC:      22.0   %    20 - 24  HUM:      62.6  mm     G. Age:  36w 2d         60  %  Est. FW:    2675  gm    5 lb 14 oz      22  % ---------------------------------------------------------------------- OB History  Blood Type:   O+  Gravidity:    1         Term:   0        Prem:   0        SAB:   0  TOP:          0       Ectopic:  0        Living: 0 ---------------------------------------------------------------------- Gestational Age  LMP:           37w 0d        Date:  12/29/22                   EDD:   10/05/23  U/S Today:     35w 5d                                        EDD:   10/14/23  Best:          36w 5d     Det. ByMarcella Dubs         EDD:   10/07/23                                      (02/23/23) ---------------------------------------------------------------------- Anatomy  Cranium:               Appears normal         LVOT:                   Previously seen  Cavum:                 Appears normal         Aortic Arch:            Previously seen  Ventricles:            Previously seen        Ductal Arch:            Previously seen  Choroid Plexus:        Previously seen        Diaphragm:              Appears normal  Cerebellum:            Previously seen        Stomach:                Appears normal, left  sided  Posterior Fossa:       Previously seen        Abdomen:                Previously seen  Nuchal Fold:           Previously seen        Abdominal Wall:         Previously seen  Face:                  Orbits and profile     Cord Vessels:           Previously seen                         previously seen  Lips:                   Previously seen        Kidneys:                Appear normal  Palate:                Previously seen        Bladder:                Appears normal  Thoracic:              Previously seen        Spine:                  Previously seen  Heart:                 Appears normal         Upper Extremities:      Previously seen                         (4CH, axis, and                         situs)  RVOT:                  Previously seen        Lower Extremities:      Previously seen  Other:  Feet, heels, lenses, hands, 3vv, 3vt, vc, previously seen. Female gender          previously seen ---------------------------------------------------------------------- Cervix Uterus Adnexa  Cervix  Not visualized (advanced GA >24wks)  Uterus  No abnormality visualized.  Right Ovary  Not visualized.  Left Ovary  Not visualized.  Cul De Sac  No free fluid seen.  Adnexa  No adnexal mass visualized ---------------------------------------------------------------------- Comments  MFM CONSULT  Princella IMAN Mckowen is a 22 y.o. G1P0000 at [redacted]w[redacted]d here for  ultrasound and consultation.  Patient Active Problem List           Diagnosis         Date Noted          Preterm uterine contractions in third trimester,  antepartum        09/08/2023          Gestational thrombocytopenia, third trimester (HCC)  (HCC)    07/20/2023          Obesity affecting pregnancy, antepartum  antepartum        05/08/2023          Supervision of other normal pregnancy, antepartum  antepartum        03/13/2023          Moderate persistent asthma without complication  complication      01/13/2023  The patient was here for a growth ultrasound due to an  elevated BMI.  Her fetal biometry is normal at the 18th  percentile.  However, the amniotic fluid is very low at 1.7 cm.  There are no other fluid pockets on ultrasound.  The patient  denies any loss of fluid.  She does report increased  contractions but denies bleeding.  She reports good fetal  movement.  She has gestational  thrombocytopenia that is stable at  129,000 on yesterday's lab work.  Sonographic findings  Single intrauterine pregnancy at [redacted]w[redacted]d.  Fetal cardiac activity:  Observed and appears normal.  Presentation: Cephalic.  Interval fetal anatomy appears normal.  Fetal biometry shows the estimated fetal weight at the 18  percentile.  Amniotic fluid volume: Oligohydramnios. MVP: 1.7 cm.  Placenta: Anterior.  Recommendations  -Due to oligohydramnios it was recommended the patient be  direct admitted for delivery.  I discussed it is possible her  water broke but also the low fluid may be result from  decreased placental perfusion and may be a sign of placental  insufficiency.  To avoid stillbirth the patient elected for  delivery versus continued outpatient management.  The  patient verbalized understanding and is agreeable to the plan.  -If uterotonic's are required avoid Hemabate given the  patient's history of asthma  -Notify anesthesia regarding her platelet count  I have notified the on-call OB provider Dr. Alvester Morin to arrange  for direct admission. ----------------------------------------------------------------------                   Braxton Feathers, DO Electronically Signed Final Report   09/14/2023 04:34 pm ----------------------------------------------------------------------    MAU Course/MDM: I have reviewed the triage vital signs and the nursing notes.   Pertinent labs & imaging results that were available during my care of the patient were reviewed by me and considered in my medical decision making (see chart for details).      I have reviewed her medical records including past results, notes and treatments.   I have ordered labs as follows: Imaging ordered:  Results reviewed.   Consult ***.   Treatments in MAU included ***.   Pt stable at time of discharge.  Assessment: No diagnosis found.  Plan: Discharge home Recommend *** Rx sent for *** for ***   Encouraged to return here or to other Urgent  Care/ED if she develops worsening of symptoms, increase in pain, fever, or other concerning symptoms.   Wynelle Bourgeois CNM, MSN Certified Nurse-Midwife 09/18/2023 11:04 PM

## 2023-09-18 NOTE — MAU Note (Signed)
..  Jessica Hendrix is a 22 y.o. at Unknown here in MAU reporting: pain has become unbearable, has taken ibuprofen, tylenol, and oxycodone. Last dose was oxycodone at 8pm. Reports that she feels like something is stretching and pulling apart. Really back pain that is radiating from bottom to top (head) Legs feel numb all the time.  Reports vaginal bleeding had gotten better but worsened once pain returned.   Reports headache and visual changes that began this evening.  Pain score: 9/10 Vitals:   09/18/23 2316 09/18/23 2319  BP: (!) 140/68   Pulse: (!) 102   Resp: 20   Temp: 97.6 F (36.4 C)   SpO2:  99%

## 2023-09-19 DIAGNOSIS — O165 Unspecified maternal hypertension, complicating the puerperium: Secondary | ICD-10-CM | POA: Diagnosis present

## 2023-09-19 DIAGNOSIS — L7682 Other postprocedural complications of skin and subcutaneous tissue: Secondary | ICD-10-CM

## 2023-09-19 HISTORY — DX: Unspecified maternal hypertension, complicating the puerperium: O16.5

## 2023-09-19 LAB — COMPREHENSIVE METABOLIC PANEL
ALT: 25 U/L (ref 0–44)
AST: 26 U/L (ref 15–41)
Albumin: 2.4 g/dL — ABNORMAL LOW (ref 3.5–5.0)
Alkaline Phosphatase: 134 U/L — ABNORMAL HIGH (ref 38–126)
Anion gap: 8 (ref 5–15)
BUN: 12 mg/dL (ref 6–20)
CO2: 23 mmol/L (ref 22–32)
Calcium: 9.2 mg/dL (ref 8.9–10.3)
Chloride: 110 mmol/L (ref 98–111)
Creatinine, Ser: 1 mg/dL (ref 0.44–1.00)
GFR, Estimated: >60 mL/min (ref >60)
Glucose, Bld: 90 mg/dL (ref 70–99)
Potassium: 4.1 mmol/L (ref 3.5–5.1)
Sodium: 141 mmol/L (ref 135–145)
Total Bilirubin: 0.3 mg/dL (ref <1.2)
Total Protein: 5.8 g/dL — ABNORMAL LOW (ref 6.5–8.1)

## 2023-09-19 LAB — CBC
HCT: 28.3 % — ABNORMAL LOW (ref 36.0–46.0)
Hemoglobin: 9.1 g/dL — ABNORMAL LOW (ref 12.0–15.0)
MCH: 26.9 pg (ref 26.0–34.0)
MCHC: 32.2 g/dL (ref 30.0–36.0)
MCV: 83.7 fL (ref 80.0–100.0)
Platelets: 164 10*3/uL (ref 150–400)
RBC: 3.38 MIL/uL — ABNORMAL LOW (ref 3.87–5.11)
RDW: 14.2 % (ref 11.5–15.5)
WBC: 9 10*3/uL (ref 4.0–10.5)
nRBC: 0 % (ref 0.0–0.2)

## 2023-09-19 MED ORDER — FUROSEMIDE 20 MG PO TABS: 20.0000 mg | ORAL_TABLET | Freq: Every day | ORAL | 0 refills | Status: DC

## 2023-09-19 MED ORDER — CYCLOBENZAPRINE HCL 10 MG PO TABS: 10.0000 mg | ORAL_TABLET | Freq: Three times a day (TID) | ORAL | 0 refills | Status: DC | PRN

## 2023-09-26 ENCOUNTER — Ambulatory Visit: Payer: 59

## 2023-09-26 VITALS — BP 115/75 | HR 94 | Wt 207.6 lb

## 2023-09-26 DIAGNOSIS — Z5189 Encounter for other specified aftercare: Secondary | ICD-10-CM

## 2023-09-26 DIAGNOSIS — Z0130 Encounter for examination of blood pressure without abnormal findings: Secondary | ICD-10-CM

## 2023-09-26 NOTE — Progress Notes (Signed)
..   Subjective:     Jessica Hendrix is a 22 y.o. female who presents to the clinic 1 weeks status post  c-section . Eating a regular diet without difficulty. Bowel movements are  regulated with Senna-docusate . The patient is not having any pain. Pt is currently completing Furosemide , denies any complications.     Objective:    BP 115/75   Pulse 94   Wt 207 lb 9.6 oz (94.2 kg)   LMP 12/23/2022   BMI 32.51 kg/m  General:  alert and cooperative  Abdomen: soft, bowel sounds active, non-tender  Incision:   healing well, no drainage, no erythema, no hernia, no seroma, no swelling, no dehiscence, incision well approximated     Assessment:    Doing well postoperatively.   Plan:    1. Continue any current medications. 2. Steri strip removed today; Wound care discussed. 3. Activity restrictions: no lifting more than 20 pounds, no repetitive motion, and no standing longer than 1 hour without a break 4. Anticipated return to work:  to be determined . 5. Follow up: 6 weeks for postpartum visit.   Laymon JONETTA Crigler, RN

## 2023-10-03 ENCOUNTER — Ambulatory Visit (HOSPITAL_COMMUNITY): Payer: Self-pay

## 2023-10-03 NOTE — Lactation Note (Signed)
 This note was copied from a baby's chart. Lactation Consultation Note  Patient Name: Jessica Hendrix Free Today's Date: 10/03/2023 Age:21 wk.o. Reason for consult: Initial assessment;Mother's request;MD order;Primapara;1st time breastfeeding;Late-preterm 34-36.6wks;Breastfeeding assistance  P1- Baby [name] has been admitted to the Peds unit for acute febrile illness in a newborn. [name] was born at [redacted]w[redacted]d two weeks ago. According to MOB, Jessica was eating well from the breast before they were admitted to the hospital, now he is struggling to latch and her milk supply has gone down. It had been almost 4 hrs since [name]'s last feeding, so LC offered to assist with a latch. MOB consented. MOB placed [name] on the left breast in the cradle hold. LC believes that the cross cradle position may be easier for [name] to latch onto, but the cradle hold is sufficient. [name] was eager and latched to the breast immediately. LC demonstrated how to flange [name]'s lips. MOB denied any pain or discomfort. LC visualized and ausculted multiple audible swallows. [name] nursed for 10 minutes before falling asleep and taking himself off of the breast. When [name] unlatched, LC noted milk dripping from MOB's nipple.  LC reassured MOB that it is normal for [name] to not nurse as well or as long when he is not feeling well. LC reassured MOB that Jessica latched very well for this feeding and LC had no concerns. LC noted no abnormal findings with [name]'s frenulum either (this was a concern from MOB). LC also reassured MOB that it is normal to see a dip in supply when her body is under stress due to worrying about her infant. LC encouraged MOB to continue breastfeeding [name] and then pump for 15 minutes after every feeding to offer extra stimulation. LC team will check up on MOB tomorrow to see how feedings went and how MOB feels her supply is looking. LC resized MOB to an 18 mm flange and provided this to her. LC encouraged MOB to  call for further assistance as needed.  Maternal Data Has patient been taught Hand Expression?: No Does the patient have breastfeeding experience prior to this delivery?: No  Feeding Mother's Current Feeding Choice: Breast Milk and Formula  LATCH Score Latch: Grasps breast easily, tongue down, lips flanged, rhythmical sucking.  Audible Swallowing: Spontaneous and intermittent  Type of Nipple: Everted at rest and after stimulation  Comfort (Breast/Nipple): Soft / non-tender  Hold (Positioning): No assistance needed to correctly position infant at breast.  LATCH Score: 10   Lactation Tools Discussed/Used Tools: Pump;Flanges Flange Size: 18 Breast pump type: Double-Electric Breast Pump;Manual Pump Education: Setup, frequency, and cleaning;Milk Storage Reason for Pumping: MOB request Pumping frequency: 15-20 min every 3 hrs after a feeding from the breast  Interventions Interventions: Breast feeding basics reviewed;Assisted with latch;Breast compression;Support pillows;Adjust position;Position options;Hand pump;DEBP;Education;LC Services brochure  Discharge Discharge Education: Warning signs for feeding baby;Engorgement and breast care  Consult Status Consult Status: Follow-up Date: 10/04/23 Follow-up type: In-patient    Recardo Hoit BS, IBCLC 10/03/2023, 11:17 PM

## 2023-10-04 ENCOUNTER — Ambulatory Visit (HOSPITAL_COMMUNITY): Payer: Self-pay

## 2023-10-04 NOTE — Lactation Note (Signed)
 This note was copied from a baby's chart. Lactation Consultation Note  Patient Name: Jessica Hendrix Free Today's Date: 10/04/2023 Age:21 wk.o. Reason for consult: Follow-up assessment;Mother's request;Primapara;1st time breastfeeding;Late-preterm 34-36.6wks  P1- MOB reports content with feeding plan created by this LC from last night. MOB reports that since we switched her flanges to size 18 mm, she has noticed a significant increase in her supply when post feeding pumping. She was seeing 0.5 ounces, now she is seeing 3-4 ounces. MOB denies any pain or discomfort in her nipples or breasts at this time. MOB also reports that infant is nursing better since LC last assessed a feeding. MOB denies needing further assistance at this time. LC reviewed feeding plan, CDC milk storage guidelines and LC services handout. LC encouraged MOB to call for further assistance as needed.  Maternal Data Has patient been taught Hand Expression?: No Does the patient have breastfeeding experience prior to this delivery?: No  Feeding Mother's Current Feeding Choice: Breast Milk and Formula Nipple Type: Regular  Lactation Tools Discussed/Used Tools: Pump;Flanges Flange Size: 18 Breast pump type: Double-Electric Breast Pump;Manual Pump Education: Setup, frequency, and cleaning;Milk Storage Reason for Pumping: extra stimulation/MOB request Pumping frequency: 15-20 min every 3 hrs  Interventions Interventions: Breast feeding basics reviewed;Education;LC Services brochure  Discharge Discharge Education: Engorgement and breast care;Warning signs for feeding baby Pump: DEBP;Manual;Personal  Consult Status Consult Status: Complete Date: 10/04/23    Recardo Hoit BS, IBCLC 10/04/2023, 4:24 PM

## 2023-10-08 ENCOUNTER — Encounter (HOSPITAL_COMMUNITY): Payer: Self-pay | Admitting: Obstetrics and Gynecology

## 2023-10-08 ENCOUNTER — Inpatient Hospital Stay (HOSPITAL_COMMUNITY): Payer: Commercial Managed Care - PPO

## 2023-10-08 ENCOUNTER — Inpatient Hospital Stay (HOSPITAL_COMMUNITY)
Admission: AD | Admit: 2023-10-08 | Discharge: 2023-10-08 | Disposition: A | Discharge status: Home / Self Care | Payer: Commercial Managed Care - PPO | Attending: Obstetrics and Gynecology | Admitting: Obstetrics and Gynecology

## 2023-10-08 DIAGNOSIS — N719 Inflammatory disease of uterus, unspecified: Secondary | ICD-10-CM

## 2023-10-08 DIAGNOSIS — O9089 Other complications of the puerperium, not elsewhere classified: Secondary | ICD-10-CM | POA: Diagnosis not present

## 2023-10-08 DIAGNOSIS — O1093 Unspecified pre-existing hypertension complicating the puerperium: Secondary | ICD-10-CM | POA: Insufficient documentation

## 2023-10-08 DIAGNOSIS — M549 Dorsalgia, unspecified: Secondary | ICD-10-CM | POA: Insufficient documentation

## 2023-10-08 DIAGNOSIS — H538 Other visual disturbances: Secondary | ICD-10-CM | POA: Diagnosis not present

## 2023-10-08 DIAGNOSIS — O209 Hemorrhage in early pregnancy, unspecified: Secondary | ICD-10-CM | POA: Insufficient documentation

## 2023-10-08 DIAGNOSIS — N939 Abnormal uterine and vaginal bleeding, unspecified: Secondary | ICD-10-CM | POA: Diagnosis not present

## 2023-10-08 DIAGNOSIS — G971 Other reaction to spinal and lumbar puncture: Secondary | ICD-10-CM

## 2023-10-08 DIAGNOSIS — D649 Anemia, unspecified: Secondary | ICD-10-CM | POA: Diagnosis not present

## 2023-10-08 DIAGNOSIS — R519 Headache, unspecified: Secondary | ICD-10-CM | POA: Diagnosis not present

## 2023-10-08 DIAGNOSIS — O9913 Other diseases of the blood and blood-forming organs and certain disorders involving the immune mechanism complicating the puerperium: Secondary | ICD-10-CM | POA: Diagnosis not present

## 2023-10-08 DIAGNOSIS — O165 Unspecified maternal hypertension, complicating the puerperium: Secondary | ICD-10-CM | POA: Diagnosis not present

## 2023-10-08 DIAGNOSIS — O99019 Anemia complicating pregnancy, unspecified trimester: Secondary | ICD-10-CM

## 2023-10-08 DIAGNOSIS — O99215 Obesity complicating the puerperium: Secondary | ICD-10-CM | POA: Insufficient documentation

## 2023-10-08 DIAGNOSIS — O99345 Other mental disorders complicating the puerperium: Secondary | ICD-10-CM | POA: Insufficient documentation

## 2023-10-08 DIAGNOSIS — R9389 Abnormal findings on diagnostic imaging of other specified body structures: Secondary | ICD-10-CM | POA: Diagnosis not present

## 2023-10-08 DIAGNOSIS — Z5986 Financial insecurity: Secondary | ICD-10-CM | POA: Diagnosis not present

## 2023-10-08 LAB — CBC
HCT: 33 % — ABNORMAL LOW (ref 36.0–46.0)
Hemoglobin: 10.3 g/dL — ABNORMAL LOW (ref 12.0–15.0)
MCH: 25.6 pg — ABNORMAL LOW (ref 26.0–34.0)
MCHC: 31.2 g/dL (ref 30.0–36.0)
MCV: 81.9 fL (ref 80.0–100.0)
Platelets: 245 10*3/uL (ref 150–400)
RBC: 4.03 MIL/uL (ref 3.87–5.11)
RDW: 13.7 % (ref 11.5–15.5)
WBC: 5.5 10*3/uL (ref 4.0–10.5)
nRBC: 0 % (ref 0.0–0.2)

## 2023-10-08 LAB — COMPREHENSIVE METABOLIC PANEL
ALT: 30 U/L (ref 0–44)
AST: 28 U/L (ref 15–41)
Albumin: 3.2 g/dL — ABNORMAL LOW (ref 3.5–5.0)
Alkaline Phosphatase: 108 U/L (ref 38–126)
Anion gap: 8 (ref 5–15)
BUN: 9 mg/dL (ref 6–20)
CO2: 23 mmol/L (ref 22–32)
Calcium: 9 mg/dL (ref 8.9–10.3)
Chloride: 107 mmol/L (ref 98–111)
Creatinine, Ser: 0.81 mg/dL (ref 0.44–1.00)
GFR, Estimated: >60 mL/min (ref >60)
Glucose, Bld: 89 mg/dL (ref 70–99)
Potassium: 3.9 mmol/L (ref 3.5–5.1)
Sodium: 138 mmol/L (ref 135–145)
Total Bilirubin: 0.5 mg/dL (ref 0.0–1.2)
Total Protein: 6.4 g/dL — ABNORMAL LOW (ref 6.5–8.1)

## 2023-10-08 MED ORDER — OXYCODONE HCL 5 MG PO TABS: 5.0000 mg | ORAL_TABLET | Freq: Four times a day (QID) | ORAL | 0 refills | Status: DC | PRN

## 2023-10-08 MED ORDER — AMOXICILLIN-POT CLAVULANATE 875-125 MG PO TABS: 1.0000 | ORAL_TABLET | Freq: Two times a day (BID) | ORAL | 0 refills | Status: DC

## 2023-10-08 MED ORDER — OXYCODONE HCL 5 MG PO TABS
10.0000 mg | ORAL_TABLET | Freq: Once | ORAL | Status: AC
  Administered 2023-10-08: 10 mg via ORAL
  Filled 2023-10-08: qty 2

## 2023-10-08 MED ORDER — AMOXICILLIN-POT CLAVULANATE 875-125 MG PO TABS
1.0000 | ORAL_TABLET | Freq: Once | ORAL | Status: AC
  Administered 2023-10-08: 1 via ORAL
  Filled 2023-10-08: qty 1

## 2023-10-08 MED ORDER — KETOROLAC TROMETHAMINE 30 MG/ML IJ SOLN
30.0000 mg | Freq: Once | INTRAMUSCULAR | Status: AC
  Administered 2023-10-08: 30 mg via INTRAMUSCULAR
  Filled 2023-10-08: qty 1

## 2023-10-08 MED ORDER — KETOROLAC TROMETHAMINE 10 MG PO TABS: 10.0000 mg | ORAL_TABLET | Freq: Four times a day (QID) | ORAL | 0 refills | Status: DC | PRN

## 2023-10-08 MED ORDER — AMOXICILLIN-POT CLAVULANATE 875-125 MG PO TABS: 1.0000 | ORAL_TABLET | Freq: Two times a day (BID) | ORAL | 0 refills | Status: AC

## 2023-10-08 NOTE — MAU Note (Signed)
...  Jessica Hendrix is a 23 y.o. at Unknown here in MAU reporting: Three weeks and two days PP PTD at [redacted]w[redacted]d via C/S. HA/back pain, vaginal bleeding, and incisional pain. She reports she was evaluated in MAU on 12/23 for the same pains she is experiencing. She reports her pains had improved but reports within the last two days her pains have returned. She reports her back pain starts where her epidural was placed and radiates up her spine and around the left side of her head. She reports when this pain occurs in her back/head, her left eye gets blurry. She reports she is also concerned as her vaginal bleeding seems to be increasing and she passed a finger sized blood clot today. Denies drainage from her incision.  Elevated BP post partum during MAU visit on 12/23.  Last med doses: Flexeril  and Ibuprofen  around midnight.  Onset of complaint: two days, returned Pain score: 7/10 back/head  Vitals:   10/08/23 1311  BP: (!) 140/107  Pulse: 79  Resp: 16  Temp: 98.3 F (36.8 C)  SpO2: 100%     FHT: n/a Lab orders placed from triage: none

## 2023-10-08 NOTE — MAU Provider Note (Signed)
 MAU Provider Note  Chief Complaint: Headache and Vaginal Bleeding  SUBJECTIVE HPI: Jessica Hendrix is a 23 y.o. G1P0101 3 weeks PP from pLTCS who presents to maternity admissions reporting HA, heavy bleeding, and back pain. Pregnancy c/b PP HTN, oligo requiring preterm delivery. Receives [facility] with [facility].  Ongoing back pain which shoots up to occiput and around head to b/l temples since delivery. Does note some blurry vision. Home ibuprofen  and flexeril  have been insufficient. These pains were much better after the first few days postpartum, however worsened over the past 2 days. She denies RUQ pain, extremity swelling, CP, SOB. Lochia significantly lightened, then increased two days ago with finger-sized clot. Notes Bps at home have been persistently 130/80s or higher.  Denies urinary symptoms, abdominal pain. Babe doing well. Not currently breastfeeding as suspects babe is allergic to dairy while she cuts out dairy. Is pumping.   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/05/2019   GAD (generalized anxiety disorder) 03/08/2020   Pyelonephritis    Supervision of other normal pregnancy, antepartum 03/13/2023              NURSING     PROVIDER      Office Location    [facility]    Dating by    LMP c/w U/S at 7 wks      [facility] Model    Traditional    Anatomy U/S    incomplete      Initiated care at     [facility]     English                     LAB RESULTS       Support Person         Genetics    NIPS: LR  AFP:  neg                NT/IT (FT only)                     Carrier Screen       Twitch 12/05/2019   UTI (urinary tract infection)    Past Surgical History:  Procedure Laterality Date   CESAREAN SECTION N/A 09/15/2023   Procedure: CESAREAN SECTION;  Surgeon: Erik Kieth BROCKS, MD;  Location: [facility] LD ORS;  Service: Obstetrics;  Laterality:  N/A;   WISDOM TOOTH EXTRACTION     Social History   Socioeconomic History   Marital status: Single    Spouse name: Not on file   Number of children: 0   Years of education: Not on file   Highest education level: 12th grade  Occupational History   Not on file  Tobacco Use   Smoking status: Never   Smokeless tobacco: Never  Vaping Use   Vaping status: Never Used  Substance and Sexual Activity   Alcohol use: Not Currently   Drug use: Not Currently    Types: Marijuana    Comment: last use feb 2024   Sexual activity: Not Currently    Birth control/protection: Condom  Other Topics Concern   Not on file  Social History Narrative  Female partner   Programmer, [employer]   Social Drivers of Health   Financial Resource Strain: Medium Risk (01/13/2023)   Overall Financial Resource Strain (CARDIA)    Difficulty of Paying Living Expenses: Somewhat hard  Food Insecurity: No Food Insecurity (09/14/2023)   Hunger Vital Sign    Worried About Running Out of Food in the Last Year: Never true    Ran Out of Food in the Last Year: Never true  Transportation Needs: No Transportation Needs (09/14/2023)   PRAPARE - Administrator, Civil Service (Medical): No    Lack of Transportation (Non-Medical): No  Physical Activity: Sufficiently Active (01/13/2023)   Exercise Vital Sign    Days of Exercise per Week: 7 days    Minutes of Exercise per Session: 50 min  Stress: Stress Concern Present (01/13/2023)   [employer] of Occupational Health - Occupational Stress Questionnaire    Feeling of Stress : To some extent  Social Connections: Unknown (01/13/2023)   Social Connection and Isolation Panel [NHANES]    Frequency of Communication with Friends and Family: More than three times a week    Frequency of Social Gatherings with Friends and Family: More than three times a week    Attends Religious Services: Never    Database Administrator or Organizations: Yes    Attends Museum/gallery Exhibitions Officer: More than 4 times per year    Marital Status: Patient declined  Intimate Partner Violence: Not At Risk (09/14/2023)   Humiliation, Afraid, Rape, and Kick questionnaire    Fear of Current or Ex-Partner: No    Emotionally Abused: No    Physically Abused: No    Sexually Abused: No   No current facility-administered medications on file prior to encounter.   Current Outpatient Medications on File Prior to Encounter  Medication Sig Dispense Refill   cyclobenzaprine  (FLEXERIL ) 10 MG tablet Take 1 tablet (10 mg total) by mouth 3 (three) times daily as needed (headache). 30 tablet 0   ibuprofen  (ADVIL ) 600 MG tablet Take 1 tablet (600 mg total) by mouth every 6 (six) hours. 30 tablet 0   Prenatal Vit-Fe Fumarate-FA (PRENATAL VITAMIN PO) Take 1 tablet by mouth daily.     senna-docusate (SENOKOT-S) 8.6-50 MG tablet Take 2 tablets by mouth daily. 60 tablet 0   acetaminophen  (TYLENOL ) 500 MG tablet Take 2 tablets (1,000 mg total) by mouth every 6 (six) hours. (Patient not taking: Reported on 09/26/2023) 30 tablet 0   Blood Pressure Monitoring (BLOOD PRESSURE KIT) DEVI 1 Device by Does not apply route once a week. 1 each 0   budesonide -formoterol  (SYMBICORT ) 80-4.5 MCG/ACT inhaler Inhale 2 puffs into the lungs every 6 (six) hours. (Patient not taking: Reported on 07/19/2023) 1 each 12   budesonide -formoterol  (SYMBICORT ) 80-4.5 MCG/ACT inhaler Inhale 2 puffs into the lungs every 6 (six) hours. (Patient not taking: Reported on 07/19/2023) 1 each 12   furosemide  (LASIX ) 20 MG tablet Take 1 tablet (20 mg total) by mouth daily for 5 days. 5 tablet 0   hydrOXYzine  (ATARAX ) 25 MG tablet Take 1 tablet (25 mg total) by mouth 3 (three) times daily as needed for itching (refractory itching after benadryl  use). (Patient not taking: Reported on 09/26/2023) 30 tablet 0   ondansetron  (ZOFRAN ) 4 MG tablet Take 2 tablets (8 mg total) by mouth every 8 (eight) hours as needed for nausea or vomiting.  (Patient not taking: Reported on 09/26/2023) 180 tablet 0   Allergies  Allergen  Reactions   Cinnamon     spices   Penicillins Other (See Comments)    unk   Citrus Rash    ROS:  Pertinent positives/negatives listed above.  I have reviewed patient's Past Medical Hx, Surgical Hx, Family Hx, Social Hx, medications and allergies.   Physical Exam  Patient Vitals for the past 24 hrs:  BP Temp Temp src Pulse Resp SpO2 Height Weight  10/08/23 1345 -- -- -- -- -- 100 % -- --  10/08/23 1344 127/69 -- -- 80 18 -- -- --  10/08/23 1340 -- -- -- -- -- 100 % -- --  10/08/23 1336 (!) 113/43 -- -- 76 18 -- -- --  10/08/23 1335 -- -- -- -- -- 100 % -- --  10/08/23 1311 (!) 140/107 98.3 F (36.8 C) Oral 79 16 100 % -- --  10/08/23 1306 -- -- -- -- -- -- 5' 7 (1.702 m) 94.6 kg   Constitutional: Well-developed, well-nourished female. Visibly uncomfortable Cardiovascular: normal rate Respiratory: normal effort GI: Abd soft, non-tender. Incision CDI MS: Extremities nontender, no edema, normal ROM Neurologic: Alert and oriented x 4. No focal abnormalities. Linear/logical though process  LAB RESULTS Results for orders placed or performed during the hospital encounter of 10/08/23 (from the past 24 hours)  CBC     Status: Abnormal   Collection Time: 10/08/23  1:58 PM  Result Value Ref Range   WBC 5.5 4.0 - 10.5 K/uL   RBC 4.03 3.87 - 5.11 MIL/uL   Hemoglobin 10.3 (L) 12.0 - 15.0 g/dL   HCT 66.9 (L) 63.9 - 53.9 %   MCV 81.9 80.0 - 100.0 fL   MCH 25.6 (L) 26.0 - 34.0 pg   MCHC 31.2 30.0 - 36.0 g/dL   RDW 86.2 88.4 - 84.4 %   Platelets 245 150 - 400 K/uL   nRBC 0.0 0.0 - 0.2 %  Comprehensive metabolic panel     Status: Abnormal   Collection Time: 10/08/23  1:58 PM  Result Value Ref Range   Sodium 138 135 - 145 mmol/L   Potassium 3.9 3.5 - 5.1 mmol/L   Chloride 107 98 - 111 mmol/L   CO2 23 22 - 32 mmol/L   Glucose, Bld 89 70 - 99 mg/dL   BUN 9 6 - 20 mg/dL   Creatinine, Ser 9.18 0.44 -  1.00 mg/dL   Calcium 9.0 8.9 - 89.6 mg/dL   Total Protein 6.4 (L) 6.5 - 8.1 g/dL   Albumin 3.2 (L) 3.5 - 5.0 g/dL   AST 28 15 - 41 U/L   ALT 30 0 - 44 U/L   Alkaline Phosphatase 108 38 - 126 U/L   Total Bilirubin 0.5 0.0 - 1.2 mg/dL   GFR, Estimated >39 >39 mL/min   Anion gap 8 5 - 15    --/--/O POS (12/19 1849)  IMAGING US  PELVIC COMPLETE WITH TRANSVAGINAL Result Date: 10/08/2023 CLINICAL DATA:  [id number] postpartum vaginal bleeding [id number] EXAM: TRANSABDOMINAL AND TRANSVAGINAL ULTRASOUND OF PELVIS TECHNIQUE: Both transabdominal and transvaginal ultrasound examinations of the pelvis were performed. Transabdominal technique was performed for global imaging of the pelvis including uterus, ovaries, adnexal regions, and pelvic cul-de-sac. It was necessary to proceed with endovaginal exam following the transabdominal exam to visualize the endometrium. COMPARISON:  None Available. FINDINGS: Uterus Measurements: 8.3 x 6.1 x 7.0 cm = volume: 187 mL. Retroverted. No fibroids or other mass visualized. Endometrium Thickness: 22 mm. Thickened and heterogeneous with slightly increased vascularity. Right ovary Measurements: 2.9 x  2.3 x 3.5 cm = volume: 12 mL. Normal appearance/no adnexal mass. Left ovary Measurements: 3.0 x 1.4 x 2.1 cm = volume: 5 mL. Normal appearance/no adnexal mass. Other findings No abnormal free fluid. IMPRESSION: 1. Thickened and heterogeneous endometrium with slightly increased vascularity. Findings could represent retained products of conception versus endometrial infection/inflammation. 2. Otherwise unremarkable pelvic ultrasound. Electronically Signed   By: Mabel Converse D.O.   On: 10/08/2023 15:28   US  MFM OB FOLLOW UP Result Date: 09/14/2023 ----------------------------------------------------------------------  OBSTETRICS REPORT                       (Signed Final 09/14/2023 04:34 pm) ---------------------------------------------------------------------- Patient Info  ID #:        [id number]                          D.O.B.:  [birth year] (22 yrs)(F)  Name:       NOLENE BREEZE Kneebone                Visit Date: 09/14/2023 03:23 pm ---------------------------------------------------------------------- Performed By  Attending:        Delora Smaller DO       Ref. Address:     [address]                                                             [address]                                                             [place]                                                             [address]  Performed By:     Erminio Gentry            Secondary Phy.:   [facility] MAU/Triage                    RDMS  Referred By:      [facility]             [facility] ---------------------------------------------------------------------- Orders  #  Description                           Code        Ordered By  1  US  MFM OB FOLLOW UP                   23183.98    DELORA SMALLER ----------------------------------------------------------------------  #  Order #                     Accession #                Episode #  1  [id number]                   [id number]                 [id number] ---------------------------------------------------------------------- Indications  Obesity complicating pregnancy, third          O99.213  trimester  Asthma                                         O99.89 j45.909  LR NIPS/Neg Horizon/Neg AFP/2hr GTT wnl  Oligohydramnios / Decreased amniotic fluid     O41.00X0  volume  [redacted] weeks gestation of pregnancy                Z3A.36 ---------------------------------------------------------------------- Fetal Evaluation  Num Of Fetuses:         1  Fetal Heart Rate(bpm):  131  Cardiac Activity:       Observed   Presentation:           Cephalic  Placenta:               Anterior  P. Cord Insertion:      Previously seen  Amniotic Fluid  AFI FV:      Oligohydramnios  AFI Sum(cm)     %Tile       Largest Pocket(cm)  1.7             < 3         1.7  RUQ(cm)       RLQ(cm)       LUQ(cm)        LLQ(cm)  0             1.7           0              0 ---------------------------------------------------------------------- Biometry  BPD:      90.1  mm     G. Age:  36w 3d         57  %    CI:         79.1   %    70 - 86                                                          FL/HC:      21.4   %    20.8 - 22.6  HC:      320.3  mm     G. Age:  36w 1d         12  %    HC/AC:      1.03        0.92 - 1.05  AC:      312.4  mm     G. Age:  35w 1d         20  %    FL/BPD:     76.1   %    71 - 87  FL:       68.6  mm     G. Age:  35w 2d         14  %    FL/AC:      22.0   %    20 - 24  HUM:      62.6  mm     G. Age:  36w 2d         60  %  Est. FW:    2675  gm    5 lb 14 oz      22  % ---------------------------------------------------------------------- OB History  Blood Type:   O+  Gravidity:    1         Term:   0        Prem:   0        SAB:   0  TOP:          0       Ectopic:  0        Living: 0 ---------------------------------------------------------------------- Gestational Age  LMP:           37w 0d        Date:  12/29/22                   EDD:   10/05/23  U/S Today:     35w 5d                                        EDD:   10/14/23  Best:          36w 5d     Det. By:  Early Ultrasound         EDD:   10/07/23                                      (02/23/23) ---------------------------------------------------------------------- Anatomy  Cranium:               Appears normal         LVOT:  Previously seen  Cavum:                 Appears normal         Aortic Arch:            Previously seen  Ventricles:            Previously seen        Ductal Arch:            Previously seen  Choroid Plexus:        Previously seen        Diaphragm:               Appears normal  Cerebellum:            Previously seen        Stomach:                Appears normal, left                                                                        sided  Posterior Fossa:       Previously seen        Abdomen:                Previously seen  Nuchal Fold:           Previously seen        Abdominal Wall:         Previously seen  Face:                  Orbits and profile     Cord Vessels:           Previously seen                         previously seen  Lips:                  Previously seen        Kidneys:                Appear normal  Palate:                Previously seen        Bladder:                Appears normal  Thoracic:              Previously seen        Spine:                  Previously seen  Heart:                 Appears normal         Upper Extremities:      Previously seen                         (4CH, axis, and                         situs)  RVOT:  Previously seen        Lower Extremities:      Previously seen  Other:  Feet, heels, lenses, hands, 3vv, 3vt, vc, previously seen. Female gender          previously seen ---------------------------------------------------------------------- Cervix Uterus Adnexa  Cervix  Not visualized (advanced GA >24wks)  Uterus  No abnormality visualized.  Right Ovary  Not visualized.  Left Ovary  Not visualized.  Cul De Sac  No free fluid seen.  Adnexa  No adnexal mass visualized ---------------------------------------------------------------------- Comments  MFM CONSULT  [facility] IMAN Benegas is a 23 y.o. G1P0000 at [redacted]w[redacted]d here for  ultrasound and consultation.  Patient Active Problem List           Diagnosis         Date Noted          Preterm uterine contractions in third trimester,  antepartum        09/08/2023          Gestational thrombocytopenia, third trimester (HCC)  (HCC)    07/20/2023          Obesity affecting pregnancy, antepartum  antepartum        05/08/2023          Supervision of other normal  pregnancy, antepartum  antepartum        03/13/2023          Moderate persistent asthma without complication  complication      01/13/2023  The patient was here for a growth ultrasound due to an  elevated BMI.  Her fetal biometry is normal at the 18th  percentile.  However, the amniotic fluid is very low at 1.7 cm.  There are no other fluid pockets on ultrasound.  The patient  denies any loss of fluid.  She does report increased  contractions but denies bleeding.  She reports good fetal  movement.  She has gestational thrombocytopenia that is stable at  129,000 on yesterday's lab work.  Sonographic findings  Single intrauterine pregnancy at [redacted]w[redacted]d.  Fetal cardiac activity:  Observed and appears normal.  Presentation: Cephalic.  Interval fetal anatomy appears normal.  Fetal biometry shows the estimated fetal weight at the 18  percentile.  Amniotic fluid volume: Oligohydramnios. MVP: 1.7 cm.  Placenta: Anterior.  Recommendations  -Due to oligohydramnios it was recommended the patient be  direct admitted for delivery.  I discussed it is possible her  water  broke but also the low fluid may be result from  decreased placental perfusion and may be a sign of placental  insufficiency.  To avoid stillbirth the patient elected for  delivery versus continued outpatient management.  The  patient verbalized understanding and is agreeable to the plan.  -If uterotonic's are required avoid Hemabate given the  patient's history of asthma  -Notify anesthesia regarding her platelet count  I have notified the on-call OB provider Dr. Eldonna to arrange  for direct admission. ----------------------------------------------------------------------                   Delora Smaller, DO Electronically Signed Final Report   09/14/2023 04:34 pm ----------------------------------------------------------------------    MAU Management/MDM: Orders Placed This Encounter  Procedures   US  PELVIC COMPLETE WITH TRANSVAGINAL   CBC   Comprehensive  metabolic panel    Meds ordered this encounter  Medications   ketorolac  (TORADOL ) 30 MG/ML injection 30 mg   oxyCODONE  (Oxy IR/ROXICODONE ) immediate release tablet 10 mg    Refill:  0   ketorolac  (TORADOL )  10 MG tablet    Sig: Take 1 tablet (10 mg total) by mouth every 6 (six) hours as needed. Do NOT take with ibuprofen     Dispense:  30 tablet    Refill:  0   oxyCODONE  (ROXICODONE ) 5 MG immediate release tablet    Sig: Take 1 tablet (5 mg total) by mouth every 6 (six) hours as needed for severe pain (pain score 7-10).    Dispense:  15 tablet    Refill:  0     Available prenatal records reviewed.  Patient presents for HA, back pain, increase in vaginal bleeding 3 weeks PP from pLTCS. Do suspect that back pain is related to anesthesia and additionally fatigue. Also suspect this is exacerbating HA. Will treat with toradol  and oxycodone  and assess for improvement. However initial BP and home Bps have been elevated in setting of known PP HTN, so need to rule-out PP preE although this would be strange 3 weeks out as well. Will obtain ultrasound to assess for possible retained products given clots at 3 weeks.  #HA/back pain: resolved after toradol  and oxycodone . Will provided with PO toradol  and oxycodone  for home. Discussed that this is likely post-spinal with inflammation/swelling and will continue to get better. With normal Bps after initial and no signs of preE on labs, do not suspect that is causing headache at this time.  #Heavy lochia: Ultrasound showed possible retained products vs inflammation/infection. Reassuringly current bleeding is light and her anemia has improved from postpartum. Called to discuss case with Dr. Erik. She will come down to assess patient.  ASSESSMENT 1. Headache after spinal puncture   2. Abnormal lochia   3. Postpartum hypertension   4. Anemia affecting pregnancy, antepartum   5. Postpartum care following cesarean delivery     PLAN Discharge home with  strict return precautions. Allergies as of 10/08/2023       Reactions   Cinnamon    spices   Penicillins Other (See Comments)   unk   Citrus Rash        Medication List     STOP taking these medications    acetaminophen  500 MG tablet Commonly known as: TYLENOL    budesonide -formoterol  80-4.5 MCG/ACT inhaler Commonly known as: Symbicort    furosemide  20 MG tablet Commonly known as: LASIX    hydrOXYzine  25 MG tablet Commonly known as: ATARAX    ondansetron  4 MG tablet Commonly known as: Zofran        TAKE these medications    Blood Pressure Kit Devi 1 Device by Does not apply route once a week.   cyclobenzaprine  10 MG tablet Commonly known as: FLEXERIL  Take 1 tablet (10 mg total) by mouth 3 (three) times daily as needed (headache).   ibuprofen  600 MG tablet Commonly known as: ADVIL  Take 1 tablet (600 mg total) by mouth every 6 (six) hours.   ketorolac  10 MG tablet Commonly known as: TORADOL  Take 1 tablet (10 mg total) by mouth every 6 (six) hours as needed. Do NOT take with ibuprofen    oxyCODONE  5 MG immediate release tablet Commonly known as: Roxicodone  Take 1 tablet (5 mg total) by mouth every 6 (six) hours as needed for severe pain (pain score 7-10).   PRENATAL VITAMIN PO Take 1 tablet by mouth daily.   senna-docusate 8.6-50 MG tablet Commonly known as: Senokot-S Take 2 tablets by mouth daily.         Almarie Moats, MD OB Fellow 10/08/2023  3:51 PM

## 2023-10-12 ENCOUNTER — Ambulatory Visit (INDEPENDENT_AMBULATORY_CARE_PROVIDER_SITE_OTHER): Payer: Commercial Managed Care - PPO | Admitting: Obstetrics and Gynecology

## 2023-10-12 ENCOUNTER — Encounter: Payer: Self-pay | Admitting: Obstetrics and Gynecology

## 2023-10-12 NOTE — Progress Notes (Signed)
22 yp P1 s/p cesarean section on 09/15/23 due to failure to progress presenting for ED follow up. Patient was seen in MAU on 10/08/23 and diagnosed with endometritis. Patient has been taking antibiotics as prescribed. She reports improvement in her pain. She is breast and formula feeding (more breast than formula) and reports resolution of vaginal bleeding. She reports receiving help from her partner. She denies concerns regarding postpartum depression. Patient is without complaints.  Past Medical History:  Diagnosis Date   Allergy    Anemia    Anxiety    Asthma    Constipation    Depression    Depression, major, single episode, moderate (HCC) 12/05/2019   GAD (generalized anxiety disorder) 03/08/2020   Pyelonephritis    Supervision of other normal pregnancy, antepartum 03/13/2023              NURSING     PROVIDER      Office Location    [facility]    Dating by    LMP c/w U/S at 7 wks      [facility] Model    Traditional    Anatomy U/S    incomplete      Initiated care at     [facility]     English                     LAB RESULTS       Support Person         Genetics    NIPS: LR  AFP:  neg                NT/IT (FT only)                     Carrier Screen       Twitch 12/05/2019   UTI (urinary tract infection)    Past Surgical History:  Procedure Laterality Date   CESAREAN SECTION N/A 09/15/2023   Procedure: CESAREAN SECTION;  Surgeon: Lennart Pall, MD;  Location: [facility] LD ORS;  Service: Obstetrics;  Laterality: N/A;   WISDOM TOOTH EXTRACTION     Family History  Problem Relation Age of Onset   Asthma Mother    Sleep apnea Mother    Obstructive Sleep Apnea Mother    Diabetes Father    Allergic rhinitis Neg Hx    Angioedema Neg Hx    Atopy Neg Hx    Eczema Neg Hx    Immunodeficiency Neg Hx    Urticaria Neg Hx    Social History   Tobacco Use   Smoking status: Never   Smokeless tobacco: Never  Vaping Use   Vaping status: Never Used  Substance Use Topics    Alcohol use: Not Currently   Drug use: Not Currently    Types: Marijuana    Comment: last use feb 2024   ROS See pertinent in HPI. All other systems reviewed and non contributory Blood pressure 126/67, pulse 90, height 5' 6.5" (1.689 m), weight 209 lb (94.8 kg), last menstrual period 12/23/2022, currently breastfeeding. GENERAL: Well-developed, well-nourished female in no acute distress.  ABDOMEN: Soft, nontender, nondistended. Incision healing well without erythema, induration or drainage PELVIC: Not indicated EXTREMITIES: No cyanosis, clubbing, or edema, 2+ distal pulses.  A/P 23 yo P1 here for MAU follow up on endometritis - Patient  is hemodynamically stable - Patient advised to complete antibiotics as prescribed - Follow up for postpartum visit as scheduled - Patient plans Nexplanon for contraception

## 2023-10-12 NOTE — Progress Notes (Signed)
22 y.o. 4 weeks PP presents for ED follow up

## 2023-10-13 ENCOUNTER — Ambulatory Visit: Payer: Commercial Managed Care - PPO | Admitting: Obstetrics and Gynecology

## 2023-10-22 ENCOUNTER — Encounter (HOSPITAL_COMMUNITY): Payer: Self-pay | Admitting: Obstetrics & Gynecology

## 2023-10-22 ENCOUNTER — Inpatient Hospital Stay (HOSPITAL_COMMUNITY)
Admission: AD | Admit: 2023-10-22 | Discharge: 2023-10-23 | Disposition: A | Discharge status: Home / Self Care | Payer: Commercial Managed Care - PPO | Attending: Obstetrics & Gynecology | Admitting: Obstetrics & Gynecology

## 2023-10-22 ENCOUNTER — Other Ambulatory Visit: Payer: Self-pay

## 2023-10-22 DIAGNOSIS — O9089 Other complications of the puerperium, not elsewhere classified: Secondary | ICD-10-CM | POA: Diagnosis not present

## 2023-10-22 DIAGNOSIS — M5489 Other dorsalgia: Secondary | ICD-10-CM

## 2023-10-22 DIAGNOSIS — G4489 Other headache syndrome: Secondary | ICD-10-CM | POA: Diagnosis not present

## 2023-10-22 DIAGNOSIS — O99355 Diseases of the nervous system complicating the puerperium: Secondary | ICD-10-CM | POA: Insufficient documentation

## 2023-10-22 DIAGNOSIS — N939 Abnormal uterine and vaginal bleeding, unspecified: Secondary | ICD-10-CM | POA: Diagnosis not present

## 2023-10-22 DIAGNOSIS — O909 Complication of the puerperium, unspecified: Secondary | ICD-10-CM | POA: Diagnosis present

## 2023-10-22 DIAGNOSIS — N926 Irregular menstruation, unspecified: Secondary | ICD-10-CM | POA: Diagnosis not present

## 2023-10-22 LAB — WET PREP, GENITAL
Clue Cells Wet Prep HPF POC: NONE SEEN
Sperm: NONE SEEN
Trich, Wet Prep: NONE SEEN
WBC, Wet Prep HPF POC: <10 (ref <10)
Yeast Wet Prep HPF POC: NONE SEEN

## 2023-10-22 MED ORDER — GABAPENTIN 300 MG PO CAPS: 300.0000 mg | ORAL_CAPSULE | Freq: Three times a day (TID) | ORAL | 3 refills | Status: DC

## 2023-10-22 MED ORDER — GABAPENTIN 300 MG PO CAPS
300.0000 mg | ORAL_CAPSULE | Freq: Three times a day (TID) | ORAL | Status: DC
  Administered 2023-10-22: 300 mg via ORAL
  Filled 2023-10-22: qty 1

## 2023-10-22 MED ORDER — IBUPROFEN 800 MG PO TABS: 800.0000 mg | ORAL_TABLET | Freq: Once | ORAL | Status: DC | PRN

## 2023-10-22 MED ORDER — IBUPROFEN 800 MG PO TABS: 800.0000 mg | ORAL_TABLET | Freq: Once | ORAL | Status: DC

## 2023-10-22 MED ORDER — ACETAMINOPHEN 500 MG PO TABS
1000.0000 mg | ORAL_TABLET | Freq: Once | ORAL | Status: AC
  Administered 2023-10-22: 1000 mg via ORAL
  Filled 2023-10-22: qty 2

## 2023-10-22 NOTE — MAU Provider Note (Signed)
[facility] Attending MAU Note  Chief Complaint: Vaginal Bleeding and Headache   Event Date/Time   First Provider Initiated Contact with Patient 10/22/23 2300     SUBJECTIVE Jessica Hendrix is a 23 y.o. G1P0101 s/p cesarean section on 09/15/23 here with report vaginal bleeding. Here with husband and newborn baby. Reports having bleeding that started around 1600, with some odor.  Also reports having headaches and back pain, associated with lower extremity numbness.  Had negative evaluation for this on 10/08/23. Home Ibuprofen helps, also has Flexeril. Refused to take Oxycodone for severe pain as prescribed. Also concerned about pain around her incision when she presses on it.  No drainage from incision.  Denies any fevers, chills, sweats, dysuria, nausea, vomiting, other GI or GU symptoms or other general symptoms.  Past Medical History:  Diagnosis Date   Allergy    Anemia    Anxiety    Asthma    Constipation    Depression    Depression, major, single episode, moderate (HCC) 12/05/2019   GAD (generalized anxiety disorder) 03/08/2020   Gestational thrombocytopenia, third trimester (HCC) 07/20/2023   Postpartum hypertension 09/19/2023   Pyelonephritis    Twitch 12/05/2019   UTI (urinary tract infection)    OB History  Gravida Para Term Preterm AB Living  1 1 0 1 0 1  SAB IAB Ectopic Multiple Live Births  0 0 0 0 1    # Outcome Date GA Lbr Len/2nd Weight Sex Type Anes PTL Lv  1 Preterm 09/15/23 [redacted]w[redacted]d  2890 g M CS-LTranv EPI  LIV   Past Surgical History:  Procedure Laterality Date   CESAREAN SECTION N/A 09/15/2023   Procedure: CESAREAN SECTION;  Surgeon: Lennart Pall, MD;  Location: [facility];  Service: Obstetrics;  Laterality: N/A;   WISDOM TOOTH EXTRACTION     Social History   Socioeconomic History   Marital status: Single    Spouse name: Not on file   Number of children: 0   Years of education: Not on file   Highest education level: 12th grade   Occupational History   Not on file  Tobacco Use   Smoking status: Never   Smokeless tobacco: Never  Vaping Use   Vaping status: Never Used  Substance and Sexual Activity   Alcohol use: Not Currently   Drug use: Not Currently    Types: Marijuana    Comment: last use feb 2024   Sexual activity: Not Currently    Birth control/protection: Condom  Other Topics Concern   Not on file  Social History Narrative   Female partner   Programmer, multimedia HT   Social Drivers of Health   Financial Resource Strain: Medium Risk (01/13/2023)   Overall Financial Resource Strain (CARDIA)    Difficulty of Paying Living Expenses: Somewhat hard  Food Insecurity: No Food Insecurity (09/14/2023)   Hunger Vital Sign    Worried About Running Out of Food in the Last Year: Never true    Ran Out of Food in the Last Year: Never true  Transportation Needs: No Transportation Needs (09/14/2023)   PRAPARE - Administrator, Civil Service (Medical): No    Lack of Transportation (Non-Medical): No  Physical Activity: Sufficiently Active (01/13/2023)   Exercise Vital Sign    Days of Exercise per Week: 7 days    Minutes of Exercise per Session: 50 min  Stress: Stress Concern Present (01/13/2023)   [employer] of Occupational Health - Occupational Stress  Questionnaire    Feeling of Stress : To some extent  Social Connections: Unknown (01/13/2023)   Social Connection and Isolation Panel [NHANES]    Frequency of Communication with Friends and Family: More than three times a week    Frequency of Social Gatherings with Friends and Family: More than three times a week    Attends Religious Services: Never    Database administrator or Organizations: Yes    Attends Engineer, structural: More than 4 times per year    Marital Status: Patient declined  Intimate Partner Violence: Not At Risk (09/14/2023)   Humiliation, Afraid, Rape, and Kick questionnaire    Fear of Current or Ex-Partner: No     Emotionally Abused: No    Physically Abused: No    Sexually Abused: No   No current facility-administered medications on file prior to encounter.   Current Outpatient Medications on File Prior to Encounter  Medication Sig Dispense Refill   Prenatal Vit-Fe Fumarate-FA (PRENATAL VITAMIN PO) Take 1 tablet by mouth daily.     senna-docusate (SENOKOT-S) 8.6-50 MG tablet Take 2 tablets by mouth daily. 60 tablet 0   Blood Pressure Monitoring (BLOOD PRESSURE KIT) DEVI 1 Device by Does not apply route once a week. 1 each 0   cyclobenzaprine (FLEXERIL) 10 MG tablet Take 1 tablet (10 mg total) by mouth 3 (three) times daily as needed (headache). 30 tablet 0   ibuprofen (ADVIL) 600 MG tablet Take 1 tablet (600 mg total) by mouth every 6 (six) hours. 30 tablet 0   ketorolac (TORADOL) 10 MG tablet Take 1 tablet (10 mg total) by mouth every 6 (six) hours as needed. Do NOT take with ibuprofen 30 tablet 0   oxyCODONE (ROXICODONE) 5 MG immediate release tablet Take 1 tablet (5 mg total) by mouth every 6 (six) hours as needed for severe pain (pain score 7-10). 15 tablet 0   Allergies  Allergen Reactions   Cinnamon     spices   Penicillins Other (See Comments)    unk   Citrus Rash    ROS: Pertinent items in HPI  OBJECTIVE BP (!) 120/53 (BP Location: Right Arm)   Pulse 85   Temp 97.9 F (36.6 C) (Oral)   Resp 18   Ht 5\' 6"  (1.676 m)   Wt 97.3 kg   SpO2 99%   BMI 34.64 kg/m  CONSTITUTIONAL: Well-developed, well-nourished female in no acute distress.  HENT:  Normocephalic, atraumatic, External right and left ear normal. Oropharynx is clear and moist EYES: Conjunctivae and EOM are normal. Pupils are equal, round, and reactive to light. No scleral icterus.  NECK: Normal range of motion, supple, no masses.  Normal thyroid.  SKIN: Skin is warm and dry. No rash noted. Not diaphoretic. No erythema. No pallor. NEUROLGIC: Alert and oriented to person, place, and time. Normal reflexes, muscle tone  coordination. No cranial nerve deficit noted. PSYCHIATRIC: Normal mood and affect. Normal behavior. Normal judgment and thought content. CARDIOVASCULAR: Normal heart rate noted RESPIRATORY: Effort and breath sounds normal, no problems with respiration noted. ABDOMEN: Soft, normal bowel sounds, no distention noted.  No tenderness, rebound or guarding. Incision is well-healed without erythema, induration or drainage  PELVIC: Normal appearing external genitalia; normal appearing vaginal mucosa and cervix.  Scant red bloody discharge noted, no active bleeding noted, wet prep  obtained. Done in the presence of Ginnie Smart, RN as the chaperone. MUSCULOSKELETAL: Normal range of motion. No tenderness.  No cyanosis, clubbing, or edema.  2+ distal pulses.  MAU COURSE Patient was given Neurontin 300 mg and Tylenol 1000 mg.  Had already taken Ibuprofen around 1800, this was also ordered prn. Wet prep negative.  RESULTS Results for orders placed or performed during the hospital encounter of 10/22/23 (from the past 24 hours)  Wet prep, genital     Status: None   Collection Time: 10/22/23 11:25 PM   Specimen: Cervix  Result Value Ref Range   Yeast Wet Prep HPF POC NONE SEEN NONE SEEN   Trich, Wet Prep NONE SEEN NONE SEEN   Clue Cells Wet Prep HPF POC NONE SEEN NONE SEEN   WBC, Wet Prep HPF POC <10 <10   Sperm NONE SEEN     ASSESSMENT 1. Headache syndrome   2. Acute back pain   3. Menstrual bleeding problem     PLAN Patient was reassured about the bleeding, this is most likely her menstrual period after delivery that occurred over a month ago.  Negative wet prep, this was reassuring and this result was discussed with patient. As for her headache and symptoms, recommended outpatient Neurology evaluation and follow up.  Offered that she can go to [facility], she declined. Was advised to go to [facility] ER if her symptoms worsen..  Outpatient Neurology referral sent for patient.  Added Neurontin to  her headache regimen for now. Discharged to home in stable condition  Allergies as of 10/22/2023       Reactions   Cinnamon    spices   Penicillins Other (See Comments)   unk   Citrus Rash        Medication List     TAKE these medications    Blood Pressure Kit Devi 1 Device by Does not apply route once a week.   cyclobenzaprine 10 MG tablet Commonly known as: FLEXERIL Take 1 tablet (10 mg total) by mouth 3 (three) times daily as needed (headache).   gabapentin 300 MG capsule Commonly known as: NEURONTIN Take 1 capsule (300 mg total) by mouth 3 (three) times daily.   ibuprofen 600 MG tablet Commonly known as: ADVIL Take 1 tablet (600 mg total) by mouth every 6 (six) hours.   ketorolac 10 MG tablet Commonly known as: TORADOL Take 1 tablet (10 mg total) by mouth every 6 (six) hours as needed. Do NOT take with ibuprofen   oxyCODONE 5 MG immediate release tablet Commonly known as: Roxicodone Take 1 tablet (5 mg total) by mouth every 6 (six) hours as needed for severe pain (pain score 7-10).   PRENATAL VITAMIN PO Take 1 tablet by mouth daily.   senna-docusate 8.6-50 MG tablet Commonly known as: Senokot-S Take 2 tablets by mouth daily.        Evaluation does not show pathology that would require ongoing emergent intervention or inpatient treatment. Patient is hemodynamically stable and mentating appropriately. Discussed findings and plan with patient, who agrees with care plan. All questions answered. Return precautions discussed and outpatient follow up recommendations given.  Tereso Newcomer, MD 10/22/2023 11:59 PM

## 2023-10-22 NOTE — MAU Note (Addendum)
.  Kailani Brass is a 23 y.o. at Unknown here in MAU reporting: PP c/s on 12/20 - was here on 1/16 for HA and back pain. Reports pain has gotten worse - will shoot up and down spine and down her legs. States from knees down to her feet feel numb. HA is in the back of her head and around her left eye. Reports increase pain around incision site. Around 1600 felt a gush of blood come out and states bleeding has an odor to it. Took ibuprofen around 1800.   Pain score: 6 - back; 9 - HA; 6 - incision  Vitals:   10/22/23 2210  BP: (!) 120/53  Pulse: 85  Resp: 18  Temp: 97.9 F (36.6 C)  SpO2: 99%     FHT: pt is PP   Lab orders placed from triage: none

## 2023-10-22 NOTE — Discharge Instructions (Signed)
Please go to the [facility] ED for any worsening headache, back pain symptoms

## 2023-10-23 DIAGNOSIS — O9089 Other complications of the puerperium, not elsewhere classified: Secondary | ICD-10-CM | POA: Diagnosis not present

## 2023-10-24 ENCOUNTER — Ambulatory Visit (INDEPENDENT_AMBULATORY_CARE_PROVIDER_SITE_OTHER): Payer: Commercial Managed Care - PPO | Admitting: Advanced Practice Midwife

## 2023-10-24 DIAGNOSIS — G971 Other reaction to spinal and lumbar puncture: Secondary | ICD-10-CM

## 2023-10-24 DIAGNOSIS — L7682 Other postprocedural complications of skin and subcutaneous tissue: Secondary | ICD-10-CM | POA: Diagnosis not present

## 2023-10-24 MED ORDER — KETOROLAC TROMETHAMINE 10 MG PO TABS: 10.0000 mg | ORAL_TABLET | Freq: Four times a day (QID) | ORAL | 0 refills | Status: DC | PRN

## 2023-10-24 NOTE — Progress Notes (Signed)
   GYNECOLOGY PROGRESS NOTE  History:  23 y.o. G1P0101 presents to [facility] office today for an incision check and problem postpartum visit.  She was seen in MAU on 10/22/23 with increased vaginal bleeding and incisional pain.  She also reports ongoing pain in her back that radiates up into her head. Toradol helps with the pain but she needs a refill. She was prescribed gabapentin on 1/26 but has not picked it up yet.  The pain is above and below the incision, and it is sore to palpation.  There was an episode of bright red bleeding on 10/22/23 that has now resolved.  She is concerned about infection because she was treated for this with Augmentin a few weeks ago.  There are no systemic symptoms, no fever, chills, or n/v.   She reports bonding well with baby and denies any feelings of depression or anxiety.    Health Maintenance Due  Topic Date Due   Pneumococcal Vaccine 35-27 Years old (1 of 2 - PCV) Never done   Meningococcal B Vaccine  Never done   DTaP/Tdap/Td (1 - Tdap) Never done     Review of Systems:  Pertinent items are noted in HPI.   Objective:  Physical Exam Blood pressure 126/63, pulse 70, weight 214 lb (97.1 kg), currently breastfeeding. VS reviewed, nursing note reviewed,  Constitutional: well developed, well nourished, no distress HEENT: normocephalic CV: normal rate Pulm/chest wall: normal effort Breast Exam: deferred Abdomen: soft, mild uterine tenderness to deep palpation Neuro: alert and oriented x 3 Skin: warm, dry, incision well approximated with no edema, erythema, or exudate.  Soft and nontender to palpation above and below incision.  Psych: affect normal Pelvic exam: Deferred  Assessment & Plan:  1. Abnormal lochia (Primary) --Bleeding may have been menstrual in nature, but pt is exclusively breastfeeding so not expecting a period yet.   --No s/sx of infection or concerns --If heavy bleeding returns, and does not resolve like menses, pt to f/u --Pt to keep  postpartum appt in 1 week   2. Pain at surgical incision --Pain is not incisional, but likely uterine.  Pain is mild, only to deep palpation which is c/w with 5 weeks postpartum on my exam today. Reassurance provided. --F/U in 1 week as scheduled   3. Headache after spinal puncture --Pain not c/w spinal headache, not positional.  --Referral to neurology placed from MAU visit, pt to keep f/u --Start gabapentin as prescribed and Rx renewed for Toradol to continue Q 6 hours.    Return for Keep scheduled postpartum appointment in 1 week.   Sharen Counter, CNM 1:09 PM

## 2023-10-24 NOTE — Progress Notes (Signed)
5 wks pp PP lochia had stopped and was just having discharge, then had bright red gush of VB in association with increased pain at incision "on the inside and outside." D/W OB who recommended f/u in MAU for [place]. Went to MAU. [place] was not done.  Since then VB slowed back down and became dark brown with some clots. Denies fever or malaise. Cont to report incision pain "on the inside and outside."  Please make recommendation toradol vs ibuprofen for incision pain.

## 2023-10-30 ENCOUNTER — Encounter: Payer: Self-pay | Admitting: Neurology

## 2023-10-30 ENCOUNTER — Ambulatory Visit (INDEPENDENT_AMBULATORY_CARE_PROVIDER_SITE_OTHER): Payer: Commercial Managed Care - PPO | Admitting: Neurology

## 2023-10-30 ENCOUNTER — Telehealth: Payer: Self-pay | Admitting: Neurology

## 2023-10-30 DIAGNOSIS — O9089 Other complications of the puerperium, not elsewhere classified: Secondary | ICD-10-CM

## 2023-10-30 DIAGNOSIS — R519 Headache, unspecified: Secondary | ICD-10-CM | POA: Diagnosis not present

## 2023-10-30 NOTE — Patient Instructions (Addendum)
MRI brain with and without contrast MRV head to rule out venous sinus thrombosis If negative, will consider CT myelogram to rule out CSF leak causing low pressure headache I will contact you to go over the result Increasing fluid intake, consider caffeinated drinks Continue to follow with PCP Go to the ED if you have worsening headache, worst headache of life, headache associated with any focal neurological deficits such as weakness, inability to speak or walk Continue to follow with PCP return sooner if worse.

## 2023-10-30 NOTE — Telephone Encounter (Signed)
Urgent MRIs sent to [facility] for scheduling, NPR.

## 2023-10-30 NOTE — Progress Notes (Signed)
[facility]  PATIENT: Jessica Hendrix DOB: 2001/05/16  REQUESTING CLINICIAN: Tereso Newcomer, MD HISTORY FROM: Patient and Partner  REASON FOR VISIT: New onset headaches    HISTORICAL  CHIEF COMPLAINT:  Chief Complaint  Patient presents with   New Patient (Initial Visit)    Pt in 12, here with partner Wilmon Pali  Pt is referred for severe headaches that are associated with back and bilateral extremity weakness/numbness. Pt states this symptoms started after she gave birth on Dec/20/2024.     HISTORY OF PRESENT ILLNESS:  This is a 23 year old woman with past medical history of anxiety and depression who is presenting with new complaints of headaches following delivery of her child on December 20. She did have emergency C-section.  She reports having epidural injection and during the procedure they have to move the needle multiple times due to patient experiencing numbness and tingling.  She reports right after delivery she started having headaches.  The pain usually start in the mid back region and then travel up to the back of the head and shoot up to the top.  She tells me she constantly has pain but with variable intensity.  She states that headache is worse with when sitting up or when walking and better with when laying flat.  She does report sensitivity to light and noise but no nausea, no vomiting.  Currently she had tried gabapentin with minimal relief but Flexeril was not helpful. Because of her C-section she is on Toradol and Opioid which help with the headaches.   Headache History and Characteristics: Onset: Since giving birth December 20 Location: Back of the head and radiates to the top Quality:  throbbing aching pain  Intensity: always there but sometimes will have shooting pain up.  Duration: Constant with variable intensity  Migrainous Features: Photophobia, phonophobia  Aura: No  History of brain injury or tumor: No   Prior  prophylaxis: Propranolol: No  Verapamil:No TCA: No Topamax: No Depakote: No Effexor: No Cymbalta: No Neurontin:No  Prior abortives: Triptan: No Anti-emetic: No Steroids: No Ergotamine suppository: No    OTHER MEDICAL CONDITIONS: Anxiety/Depression    REVIEW OF SYSTEMS: Full 14 system review of systems performed and negative with exception of: As noted in the HPI   ALLERGIES: Allergies  Allergen Reactions   Cinnamon     spices   Penicillins Other (See Comments)    unk   Citrus Rash    HOME MEDICATIONS: Outpatient Medications Prior to Visit  Medication Sig Dispense Refill   Blood Pressure Monitoring (BLOOD PRESSURE KIT) DEVI 1 Device by Does not apply route once a week. 1 each 0   cyclobenzaprine (FLEXERIL) 10 MG tablet Take 1 tablet (10 mg total) by mouth 3 (three) times daily as needed (headache). 30 tablet 0   gabapentin (NEURONTIN) 300 MG capsule Take 1 capsule (300 mg total) by mouth 3 (three) times daily. 30 capsule 3   ketorolac (TORADOL) 10 MG tablet Take 1 tablet (10 mg total) by mouth every 6 (six) hours as needed. Do NOT take with ibuprofen 30 tablet 0   oxyCODONE (ROXICODONE) 5 MG immediate release tablet Take 1 tablet (5 mg total) by mouth every 6 (six) hours as needed for severe pain (pain score 7-10). 15 tablet 0   Prenatal Vit-Fe Fumarate-FA (PRENATAL VITAMIN PO) Take 1 tablet by mouth daily.     senna-docusate (SENOKOT-S) 8.6-50 MG tablet Take 2 tablets by mouth daily. 60 tablet 0   No facility-administered medications prior  to visit.    PAST MEDICAL HISTORY: Past Medical History:  Diagnosis Date   Allergy    Anemia    Anxiety    Asthma    Constipation    Depression    Depression, major, single episode, moderate (HCC) 12/05/2019   GAD (generalized anxiety disorder) 03/08/2020   Gestational thrombocytopenia, third trimester (HCC) 07/20/2023   Postpartum hypertension 09/19/2023   Pyelonephritis    Twitch 12/05/2019   UTI (urinary tract  infection)     PAST SURGICAL HISTORY: Past Surgical History:  Procedure Laterality Date   CESAREAN SECTION N/A 09/15/2023   Procedure: CESAREAN SECTION;  Surgeon: Lennart Pall, MD;  Location: [facility] LD ORS;  Service: Obstetrics;  Laterality: N/A;   WISDOM TOOTH EXTRACTION      FAMILY HISTORY: Family History  Problem Relation Age of Onset   Asthma Mother    Sleep apnea Mother    Obstructive Sleep Apnea Mother    Diabetes Father    Allergic rhinitis Neg Hx    Angioedema Neg Hx    Atopy Neg Hx    Eczema Neg Hx    Immunodeficiency Neg Hx    Urticaria Neg Hx     SOCIAL HISTORY: Social History   Socioeconomic History   Marital status: Single    Spouse name: Not on file   Number of children: 0   Years of education: Not on file   Highest education level: 12th grade  Occupational History   Not on file  Tobacco Use   Smoking status: Never   Smokeless tobacco: Never  Vaping Use   Vaping status: Never Used  Substance and Sexual Activity   Alcohol use: Not Currently   Drug use: Not Currently    Types: Marijuana    Comment: last use feb 2024   Sexual activity: Not Currently    Birth control/protection: Condom  Other Topics Concern   Not on file  Social History Narrative   Female partner   Programmer, multimedia [employer]   Social Drivers of Health   Financial Resource Strain: Medium Risk (01/13/2023)   Overall Financial Resource Strain (CARDIA)    Difficulty of Paying Living Expenses: Somewhat hard  Food Insecurity: No Food Insecurity (09/14/2023)   Hunger Vital Sign    Worried About Running Out of Food in the Last Year: Never true    Ran Out of Food in the Last Year: Never true  Transportation Needs: No Transportation Needs (09/14/2023)   PRAPARE - Administrator, Civil Service (Medical): No    Lack of Transportation (Non-Medical): No  Physical Activity: Sufficiently Active (01/13/2023)   Exercise Vital Sign    Days of Exercise per Week: 7 days    Minutes  of Exercise per Session: 50 min  Stress: Stress Concern Present (01/13/2023)   [employer] of Occupational Health - Occupational Stress Questionnaire    Feeling of Stress : To some extent  Social Connections: Unknown (01/13/2023)   Social Connection and Isolation Panel [NHANES]    Frequency of Communication with Friends and Family: More than three times a week    Frequency of Social Gatherings with Friends and Family: More than three times a week    Attends Religious Services: Never    Database administrator or Organizations: Yes    Attends Banker Meetings: More than 4 times per year    Marital Status: Patient declined  Intimate Partner Violence: Not At Risk (09/14/2023)   Humiliation, Afraid, Rape,  and Kick questionnaire    Fear of Current or Ex-Partner: No    Emotionally Abused: No    Physically Abused: No    Sexually Abused: No    PHYSICAL EXAM  GENERAL EXAM/CONSTITUTIONAL: Vitals:  Vitals:   10/30/23 1050  BP: 107/61  Pulse: 68  Weight: 214 lb (97.1 kg)  Height: 5\' 6"  (1.676 m)   Body mass index is 34.54 kg/m. Wt Readings from Last 3 Encounters:  10/30/23 214 lb (97.1 kg)  10/24/23 214 lb (97.1 kg)  10/22/23 214 lb 9.6 oz (97.3 kg)   Patient is in no distress; well developed, nourished and groomed; neck is supple  MUSCULOSKELETAL: Gait, strength, tone, movements noted in Neurologic exam below  NEUROLOGIC: MENTAL STATUS:      No data to display         awake, alert, oriented to person, place and time recent and remote memory intact normal attention and concentration language fluent, comprehension intact, naming intact fund of knowledge appropriate  CRANIAL NERVE:  2nd - no papilledema or hemorrhages on fundoscopic exam 2nd, 3rd, 4th, 6th - pupils equal and reactive to light, visual fields full to confrontation, extraocular muscles intact, no nystagmus 5th - facial sensation symmetric 7th - facial strength symmetric 8th - hearing  intact 9th - palate elevates symmetrically, uvula midline 11th - shoulder shrug symmetric 12th - tongue protrusion midline  MOTOR:  normal bulk and tone, full strength in the BUE, BLE  SENSORY:  normal and symmetric to light touch, pinprick  COORDINATION:  finger-nose-finger, fine finger movements normal  GAIT/STATION:  normal    DIAGNOSTIC DATA (LABS, IMAGING, TESTING) - I reviewed patient records, labs, notes, testing and imaging myself where available.  Lab Results  Component Value Date   WBC 5.5 10/08/2023   HGB 10.3 (L) 10/08/2023   HCT 33.0 (L) 10/08/2023   MCV 81.9 10/08/2023   PLT 245 10/08/2023      Component Value Date/Time   NA 138 10/08/2023 1358   NA 136 07/19/2023 0917   K 3.9 10/08/2023 1358   CL 107 10/08/2023 1358   CO2 23 10/08/2023 1358   GLUCOSE 89 10/08/2023 1358   BUN 9 10/08/2023 1358   BUN 10 07/19/2023 0917   CREATININE 0.81 10/08/2023 1358   CALCIUM 9.0 10/08/2023 1358   PROT 6.4 (L) 10/08/2023 1358   PROT 6.3 07/19/2023 0917   ALBUMIN 3.2 (L) 10/08/2023 1358   ALBUMIN 3.6 (L) 07/19/2023 0917   AST 28 10/08/2023 1358   ALT 30 10/08/2023 1358   ALKPHOS 108 10/08/2023 1358   BILITOT 0.5 10/08/2023 1358   BILITOT <0.2 07/19/2023 0917   GFRNONAA >60 10/08/2023 1358   Lab Results  Component Value Date   CHOL 146 01/13/2023   HDL 48.90 01/13/2023   LDLCALC 87 01/13/2023   TRIG 51.0 01/13/2023   CHOLHDL 3 01/13/2023   Lab Results  Component Value Date   HGBA1C 5.7 (H) 04/19/2023   Lab Results  Component Value Date   VITAMINB12 241 12/04/2019   Lab Results  Component Value Date   TSH 2.91 01/13/2023      ASSESSMENT AND PLAN  23 y.o. year old female with history of anxiety depression who is presenting with new onset headaches following delivery.  Due to the fact the patient recently deliver on December 20 and started experiencing headaches, we will proceed with MRI brain and MRV to rule out venous sinus thrombosis.  If  negative, will consider CT myelogram to rule out  CSF leak.  These headaches do have the presentation of a low pressure headache.  Advised her to increase her fluid intake, she can also try caffeinated drinks.  I will contact her to go over the results.  She was understanding.  Continue to follow with PCP and go to the ED if you do have worsening of the headache, thunderclap headaches, worst headache of life or if you have any focal neurological deficit.  She voiced understanding.  Return sooner if worse.   1. Epidural anesthesia-induced headache during labor and delivery   2. Headache in pregnancy, postpartum     Patient Instructions  MRI brain with and without contrast MRV head to rule out venous sinus thrombosis If negative, will consider CT myelogram to rule out CSF leak causing low pressure headache I will contact you to go over the result Increasing fluid intake, consider caffeinated drinks Continue to follow with PCP Go to the ED if you have worsening headache, worst headache of life, headache associated with any focal neurological deficits such as weakness, inability to speak or walk Continue to follow with PCP return sooner if worse.   Orders Placed This Encounter  Procedures   MR BRAIN W WO CONTRAST   MR MRV HEAD WO CM    No orders of the defined types were placed in this encounter.   Return if symptoms worsen or fail to improve.    Windell Norfolk, MD 10/30/2023, 6:34 PM  [facility] [address], [address] [place], [place] [address] [phone]

## 2023-10-31 ENCOUNTER — Ambulatory Visit (HOSPITAL_COMMUNITY): Payer: Commercial Managed Care - PPO

## 2023-11-01 ENCOUNTER — Ambulatory Visit (HOSPITAL_COMMUNITY)
Admission: RE | Admit: 2023-11-01 | Discharge: 2023-11-01 | Disposition: A | Discharge status: Home / Self Care | Payer: Commercial Managed Care - PPO | Source: Ambulatory Visit | Attending: Neurology | Admitting: Neurology

## 2023-11-01 DIAGNOSIS — O9089 Other complications of the puerperium, not elsewhere classified: Secondary | ICD-10-CM | POA: Diagnosis present

## 2023-11-01 DIAGNOSIS — R519 Headache, unspecified: Secondary | ICD-10-CM | POA: Diagnosis present

## 2023-11-01 MED ORDER — GADOBUTROL 1 MMOL/ML IV SOLN
10.0000 mL | Freq: Once | INTRAVENOUS | Status: AC | PRN
  Administered 2023-11-01: 10 mL via INTRAVENOUS

## 2023-11-02 ENCOUNTER — Ambulatory Visit (INDEPENDENT_AMBULATORY_CARE_PROVIDER_SITE_OTHER): Payer: Medicaid Other

## 2023-11-02 DIAGNOSIS — Z308 Encounter for other contraceptive management: Secondary | ICD-10-CM

## 2023-11-02 DIAGNOSIS — Z30017 Encounter for initial prescription of implantable subdermal contraceptive: Secondary | ICD-10-CM | POA: Diagnosis not present

## 2023-11-02 DIAGNOSIS — Z3202 Encounter for pregnancy test, result negative: Secondary | ICD-10-CM

## 2023-11-02 LAB — POCT URINE PREGNANCY: Preg Test, Ur: NEGATIVE

## 2023-11-02 IMAGING — DX DG ABDOMEN 1V
1 series · 2 of 2 positions shown · non-contrast
Comparison: 07/09/2021

CLINICAL DATA: Constipation

EXAM:
ABDOMEN - 1 VIEW

[Series 1: abdomen · 0.14mm/px · 2 of 2 slices shown]
[im 1/2]
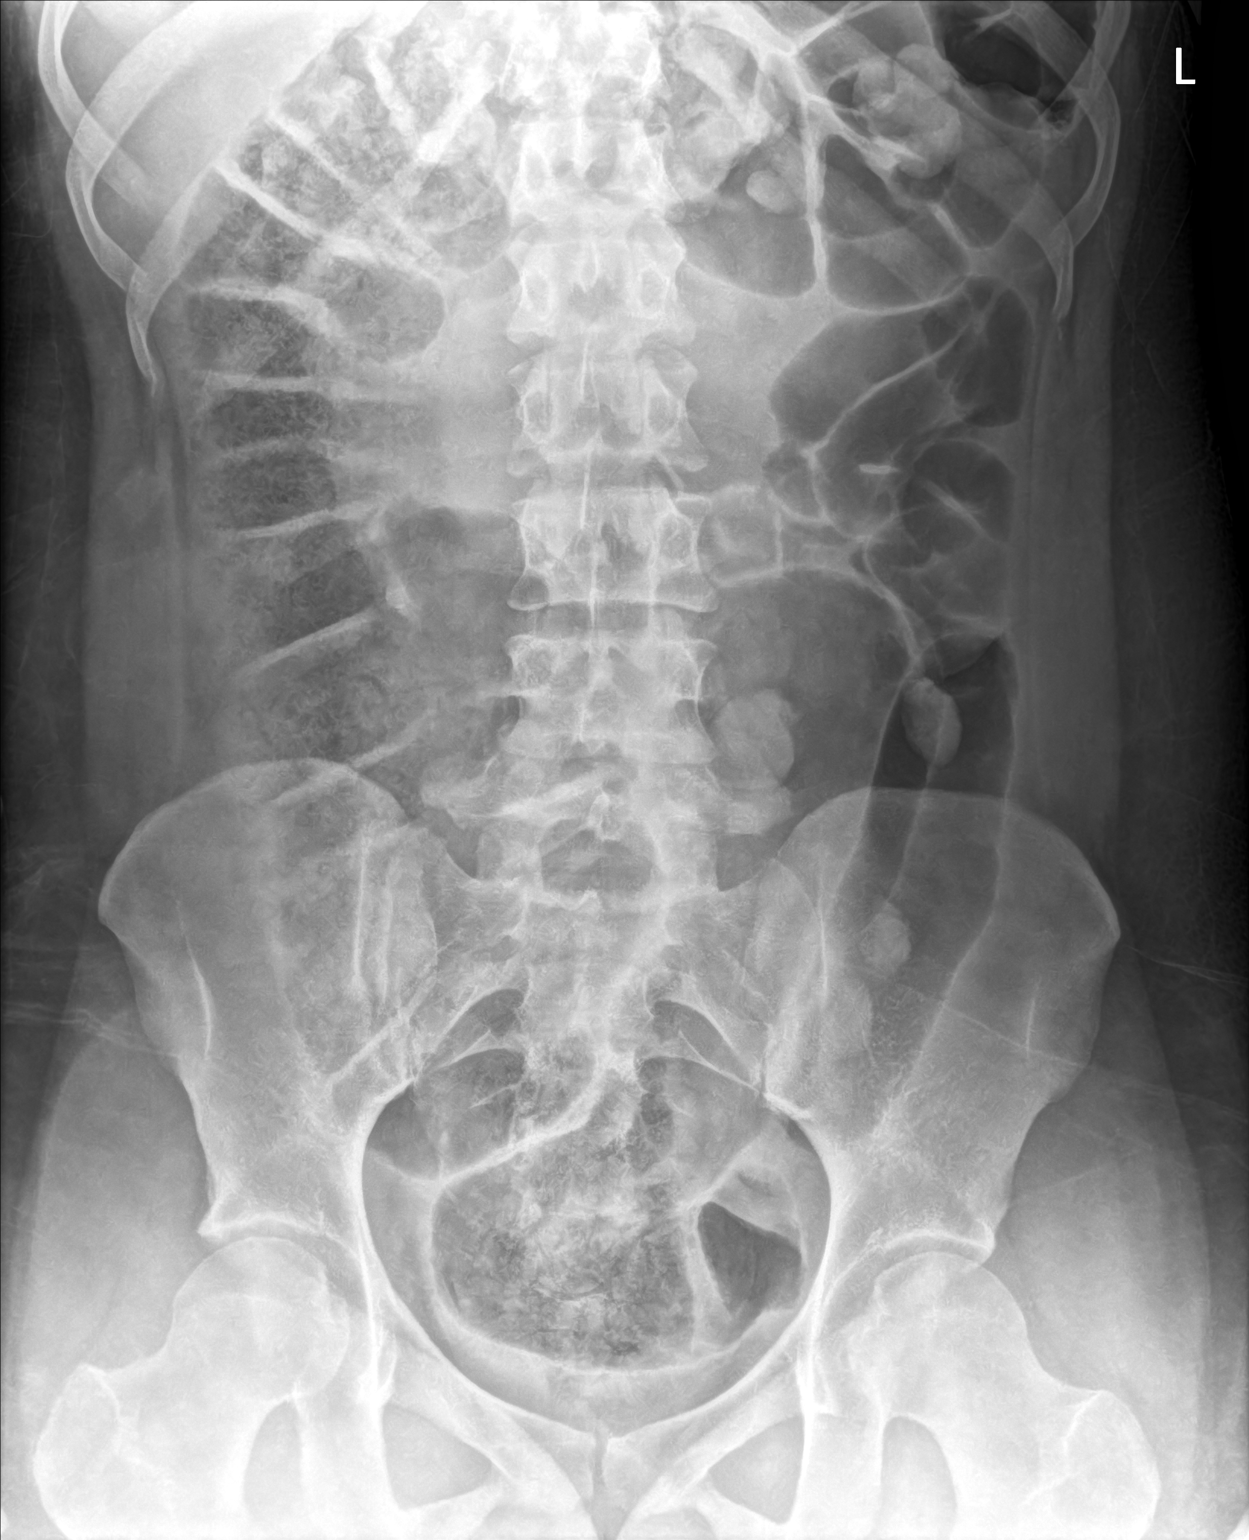
[im 2/2]
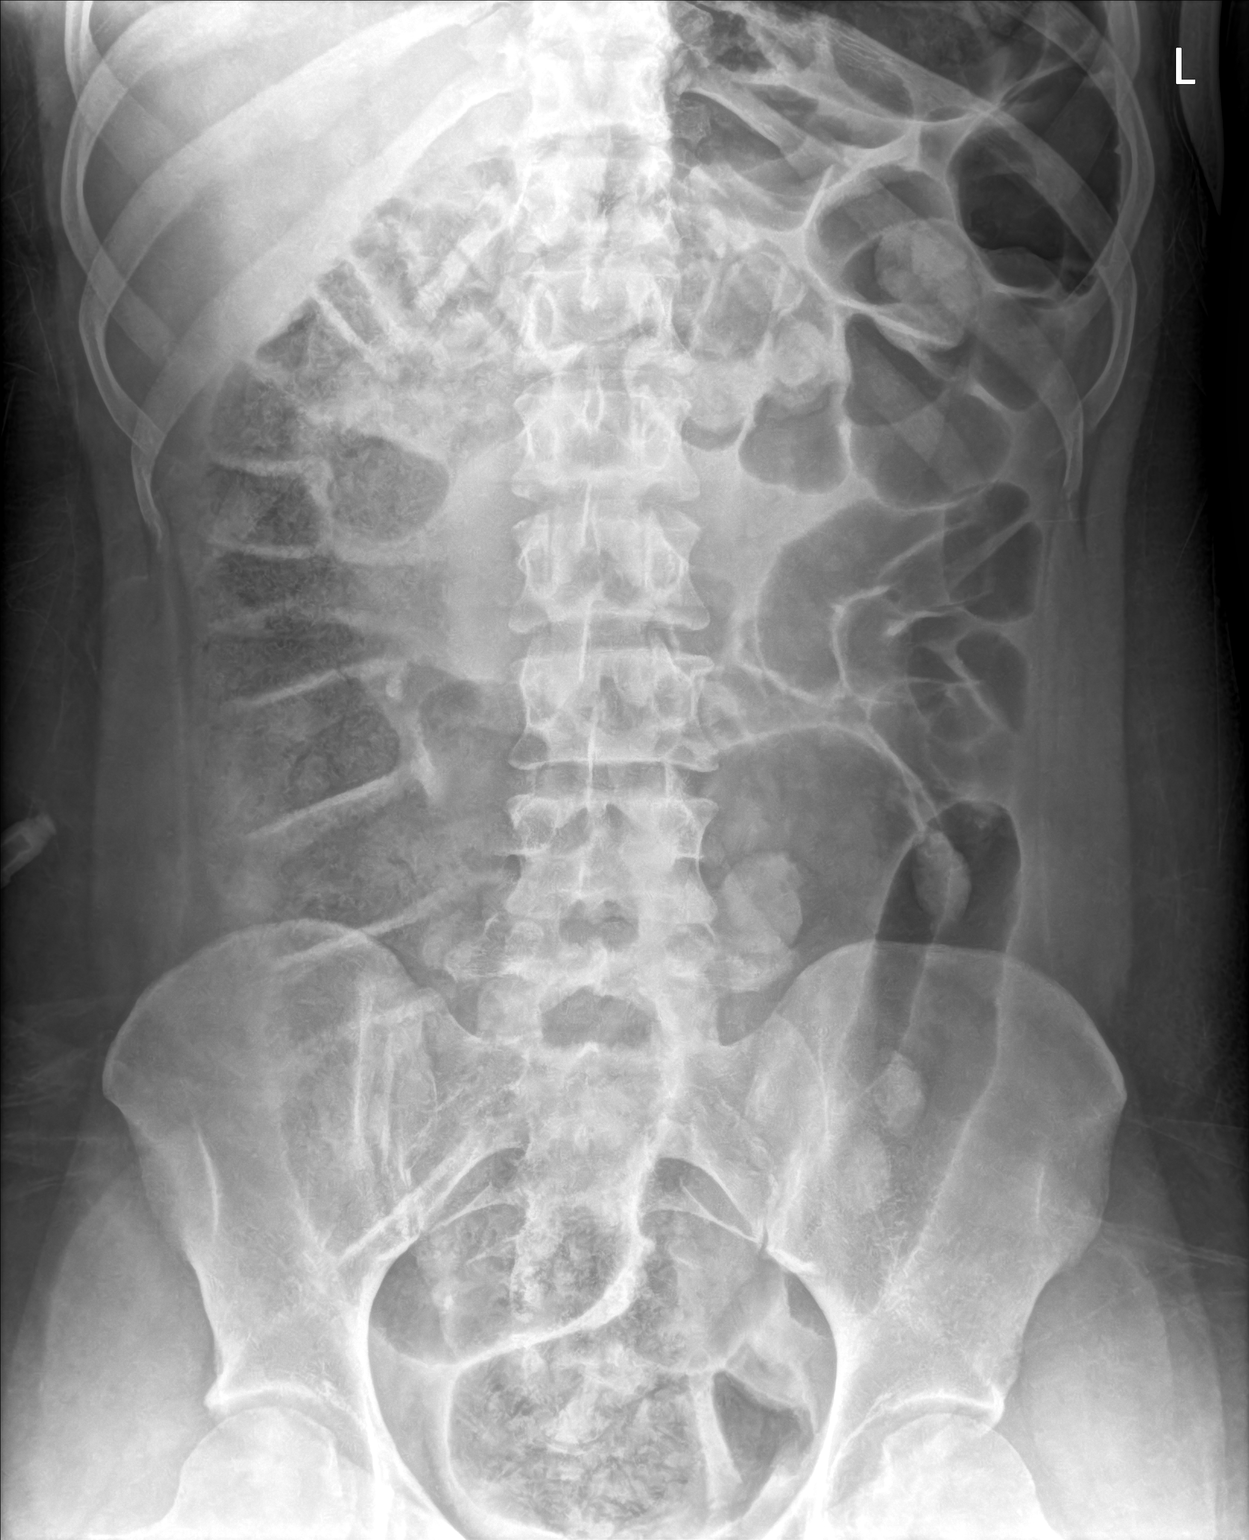

[2 of 2 positions shown; findings below may reference images not displayed]

FINDINGS: Nonobstructive bowel gas pattern.

Mild to moderate right colonic stool burden.

Visualized osseous structures are within normal limits.
IMPRESSION: Mild to moderate right colonic stool burden, within normal limits.

## 2023-11-02 MED ORDER — ETONOGESTREL 68 MG ~~LOC~~ IMPL
68.0000 mg | DRUG_IMPLANT | Freq: Once | SUBCUTANEOUS | Status: AC
  Administered 2023-11-02: 68 mg via SUBCUTANEOUS

## 2023-11-02 NOTE — Progress Notes (Signed)
 ..   Post Partum Visit Note  Jessica Hendrix is a 23 y.o. G33P0101 female who presents for a postpartum visit. She is 7 weeks postpartum following a forceps delivery and primary cesarean section.  I have fully reviewed the prenatal and intrapartum course. The delivery was at 36.6 gestational weeks.  Anesthesia: epidural. Postpartum course has been good. Baby is doing well. Baby is feeding by both breast and bottle - Similac Alimentum. Bleeding no bleeding. Bowel function is normal. Bladder function is normal. Patient is sexually active. Contraception method is Nexplanon . Postpartum depression screening: negative.   She reports breastfeeding and denies breast issues. She reports occasional sharp pain when infant latches on. She reports infant care assistance with FOB and patient father and stepmother. She endorses sleep and denies feelings of anxiety or depression. She denies pain or discomfort with sex.   The pregnancy intention screening data noted above was reviewed. Potential methods of contraception were discussed. The patient elected to proceed with No data recorded.    Health Maintenance Due  Topic Date Due   Pneumococcal Vaccine 65-76 Years old (1 of 2 - PCV) Never done   DTaP/Tdap/Td (1 - Tdap) Never done   COVID-19 Vaccine (3 - 2024-25 season) 05/28/2023    The following portions of the patient's history were reviewed and updated as appropriate: allergies, current medications, past family history, past medical history, past social history, past surgical history, and problem list.  Review of Systems Pertinent items are noted in HPI.  Objective:  LMP 12/23/2022    General:  alert, cooperative, and no distress   Breasts:  normal  Lungs: clear to auscultation bilaterally  Heart:  regular rate and rhythm  Abdomen: normal findings: no organomegaly   Wound None  GU exam:  not indicated       Assessment:   4 weeks postpartum exam S/P Cesarean section Normal  [employer] Desires Nexplanon   Plan:  -Congratulations given. -Reviewed sexual activity after delivery and with breastfeeding.  -Okay to return to work when desired.  -Nexplanon  inserted. See procedure note.  -RTO in 3-6 months for annual exam with initial pap smear or as needed.  -Problem list reviewed and resolution of problems as appropriate.   Essential components of care per ACOG recommendations:  1.  Mood and well being: Patient with negative depression screening today. Reviewed local resources for support.  - Patient tobacco use? No.   - hx of drug use? No.    2. Infant care and feeding:  -Patient currently breastmilk feeding? Yes. Reviewed importance of draining breast regularly to support lactation.  -Social determinants of health (SDOH) reviewed in EPIC. No concerns  3. Sexuality, contraception and birth spacing - Patient does not want a pregnancy in the next year.  Desired family size is 3 children.  - Reviewed reproductive life planning. Reviewed contraceptive methods based on pt preferences and effectiveness.  Patient desired Hormonal Implant today.   - Discussed birth spacing of 18 months  4. Sleep and fatigue -Encouraged family/partner/community support of 4 hrs of uninterrupted sleep to help with mood and fatigue  5. Physical Recovery  - Discussed patients delivery and complications. She describes her labor as mixed. Patient states it was traumatic, but as long as my baby is here and healthy that;s all that matters. - Patient had a C-section failure to progress. Patient had a no laceration. Perineal healing reviewed. Patient expressed understanding - Patient has urinary incontinence? No. - Patient has already resumed physical and sexual activity  6.  Health Maintenance - HM due items addressed Yes - Last pap smear: Pap smear not done at today's visit.  -Breast Cancer screening indicated? No.   7. Chronic Disease/Pregnancy Condition follow up: None - PCP  follow up  Duwaine LOISE Galla, RN [facility], [facility]     Harlene LITTIE Duncans MSN, CNM Advanced Practice Provider, [facility] [employer]

## 2023-11-04 NOTE — Progress Notes (Signed)
   OFFICE PROCEDURE NOTE-NEXPLANON  INSERTION  History:  Jessica Hendrix is a 24 y.o. [phone] here today for Nexplanon  insertion during her postpartum visit.   Procedure   Nexplanon  Insertion Procedure:  Jessica Hendrix requests insertion of Nexplanon  for contraception method.  She understands the risks associated with insertion including pain, bleeding, infection, and paresthesias of the arm.  Patient also understands that Nexplanon  can cause change in bleeding.  However, patient accepts and understands all these risks and desires to proceed.  Nexplanon  inserted as below.  Informed consent signed. UPT negative.   Appropriate time out taken.  Patient's non-dominant left arm was identified prepped and draped in the usual sterile fashion. The area was identified ~10 cm from epicondyle and 3cm posterior to the sulcus between the biceps and tricep muscles. The area was prepped with alcohol swab and then injected with 2.5mL of 1% lidocaine  along the anticipated insertion route.  The area was then prepped with betadine and allowed 60 seconds before being wiped away with sterile gauze. Nexplanon  was removed from packaging and device was confirmed within needle by provider visualization.   The device was then inserted per manufacturers instruction without complications.  The device was then palpated in the patient's arm by patient and provider. The insertion area was hemostatic with pressure and covered with steri strip and band-aid.  The arm was then wrapped with pressure dressing.   Assessment & Plan:  23 year old Postpartum State Nexplanon  Insertion   The patient tolerated the procedure well and was given post procedure instructions to include: *Remove of pressure dressing and band-aid in 12-24 hours. *Remove of steri-strips after no more than 7 days. *Condom usage encouraged for the first 7 days to avoid pregnancy and always to avoid STDs. *Tylenol  or ibuprofen  for insertion  pain/discomfort. *Call for any other questions, concerns, or complications.    Jessica LITTIE Duncans MSN, CNM

## 2023-11-05 ENCOUNTER — Encounter: Payer: Self-pay | Admitting: Neurology

## 2023-11-16 ENCOUNTER — Telehealth (INDEPENDENT_AMBULATORY_CARE_PROVIDER_SITE_OTHER): Payer: Medicaid Other | Admitting: Neurology

## 2023-11-16 ENCOUNTER — Encounter: Payer: Self-pay | Admitting: Neurology

## 2023-11-16 DIAGNOSIS — R519 Headache, unspecified: Secondary | ICD-10-CM | POA: Diagnosis not present

## 2023-11-16 NOTE — Patient Instructions (Signed)
 CT myelogram to rule out CSF leak. I will contact you to go over the results.  Advised patient to consider Tylenol up to 2 g daily for headaches Continue to follow with PCP

## 2023-11-16 NOTE — Progress Notes (Signed)
 [facility]  PATIENT: Jessica Hendrix DOB: 20-Jun-2001  REQUESTING CLINICIAN: Jeani Sow, MD HISTORY FROM: Patient and Partner  REASON FOR VISIT: New onset headaches    HISTORICAL  CHIEF COMPLAINT:  Chief Complaint  Patient presents with   Headache    Follow up    INTERNAL HISTORY 11/16/2023 Patient was called today for video visit, she tells me that her headaches are still present since last visit.  She has not had any break.  Tells me that her headache is still positional, lying down seems to help but as soon as she stands up and start working her headache will return.  She has not tried the medications because of side effects of drowsiness, she is nursing and taking care of her newborn.  MRI brain and MRV show no acute abnormality.    HISTORY OF PRESENT ILLNESS:  This is a 23 year old woman with past medical history of anxiety and depression who is presenting with new complaints of headaches following delivery of her child on December 20. She did have emergency C-section.  She reports having epidural injection and during the procedure they have to move the needle multiple times due to patient experiencing numbness and tingling.  She reports right after delivery she started having headaches.  The pain usually start in the mid back region and then travel up to the back of the head and shoot up to the top.  She tells me she constantly has pain but with variable intensity.  She states that headache is worse with when sitting up or when walking and better with when laying flat.  She does report sensitivity to light and noise but no nausea, no vomiting.  Currently she had tried gabapentin with minimal relief but Flexeril was not helpful. Because of her C-section she is on Toradol and Opioid which help with the headaches.   Headache History and Characteristics: Onset: Since giving birth December 20 Location: Back of the head and radiates to the top Quality:   throbbing aching pain  Intensity: always there but sometimes will have shooting pain up.  Duration: Constant with variable intensity  Migrainous Features: Photophobia, phonophobia  Aura: No  History of brain injury or tumor: No   Prior prophylaxis: Propranolol: No  Verapamil:No TCA: No Topamax: No Depakote: No Effexor: No Cymbalta: No Neurontin:No  Prior abortives: Triptan: No Anti-emetic: No Steroids: No Ergotamine suppository: No    OTHER MEDICAL CONDITIONS: Anxiety/Depression    REVIEW OF SYSTEMS: Full 14 system review of systems performed and negative with exception of: As noted in the HPI   ALLERGIES: Allergies  Allergen Reactions   Cinnamon     spices   Penicillins Other (See Comments)    unk   Citrus Rash    HOME MEDICATIONS: Outpatient Medications Prior to Visit  Medication Sig Dispense Refill   Blood Pressure Monitoring (BLOOD PRESSURE KIT) DEVI 1 Device by Does not apply route once a week. 1 each 0   cyclobenzaprine (FLEXERIL) 10 MG tablet Take 1 tablet (10 mg total) by mouth 3 (three) times daily as needed (headache). 30 tablet 0   gabapentin (NEURONTIN) 300 MG capsule Take 1 capsule (300 mg total) by mouth 3 (three) times daily. 30 capsule 3   ketorolac (TORADOL) 10 MG tablet Take 1 tablet (10 mg total) by mouth every 6 (six) hours as needed. Do NOT take with ibuprofen 30 tablet 0   oxyCODONE (ROXICODONE) 5 MG immediate release tablet Take 1 tablet (5 mg total)  by mouth every 6 (six) hours as needed for severe pain (pain score 7-10). 15 tablet 0   Prenatal Vit-Fe Fumarate-FA (PRENATAL VITAMIN PO) Take 1 tablet by mouth daily.     senna-docusate (SENOKOT-S) 8.6-50 MG tablet Take 2 tablets by mouth daily. 60 tablet 0   No facility-administered medications prior to visit.    PAST MEDICAL HISTORY: Past Medical History:  Diagnosis Date   Allergy    Anemia    Anxiety    Asthma    Constipation    Depression    Depression, major, single episode,  moderate (HCC) 12/05/2019   GAD (generalized anxiety disorder) 03/08/2020   Gestational thrombocytopenia, third trimester (HCC) 07/20/2023   Postpartum hypertension 09/19/2023   Pyelonephritis    Twitch 12/05/2019   UTI (urinary tract infection)     PAST SURGICAL HISTORY: Past Surgical History:  Procedure Laterality Date   CESAREAN SECTION N/A 09/15/2023   Procedure: CESAREAN SECTION;  Surgeon: Lennart Pall, MD;  Location: [facility];  Service: Obstetrics;  Laterality: N/A;   WISDOM TOOTH EXTRACTION      FAMILY HISTORY: Family History  Problem Relation Age of Onset   Asthma Mother    Sleep apnea Mother    Obstructive Sleep Apnea Mother    Diabetes Father    Allergic rhinitis Neg Hx    Angioedema Neg Hx    Atopy Neg Hx    Eczema Neg Hx    Immunodeficiency Neg Hx    Urticaria Neg Hx     SOCIAL HISTORY: Social History   Socioeconomic History   Marital status: Single    Spouse name: Not on file   Number of children: 0   Years of education: Not on file   Highest education level: 12th grade  Occupational History   Not on file  Tobacco Use   Smoking status: Never   Smokeless tobacco: Never  Vaping Use   Vaping status: Never Used  Substance and Sexual Activity   Alcohol use: Not Currently   Drug use: Not Currently    Types: Marijuana    Comment: last use feb 2024   Sexual activity: Yes    Birth control/protection: Condom  Other Topics Concern   Not on file  Social History Narrative   Female partner   Programmer, multimedia [employer]   Social Drivers of Health   Financial Resource Strain: Medium Risk (01/13/2023)   Overall Financial Resource Strain (CARDIA)    Difficulty of Paying Living Expenses: Somewhat hard  Food Insecurity: No Food Insecurity (09/14/2023)   Hunger Vital Sign    Worried About Running Out of Food in the Last Year: Never true    Ran Out of Food in the Last Year: Never true  Transportation Needs: No Transportation Needs (09/14/2023)   PRAPARE  - Administrator, Civil Service (Medical): No    Lack of Transportation (Non-Medical): No  Physical Activity: Sufficiently Active (01/13/2023)   Exercise Vital Sign    Days of Exercise per Week: 7 days    Minutes of Exercise per Session: 50 min  Stress: Stress Concern Present (01/13/2023)   [employer] of Occupational Health - Occupational Stress Questionnaire    Feeling of Stress : To some extent  Social Connections: Unknown (01/13/2023)   Social Connection and Isolation Panel [NHANES]    Frequency of Communication with Friends and Family: More than three times a week    Frequency of Social Gatherings with Friends and Family: More than three  times a week    Attends Religious Services: Never    Active Member of Clubs or Organizations: Yes    Attends Banker Meetings: More than 4 times per year    Marital Status: Patient declined  Intimate Partner Violence: Not At Risk (09/14/2023)   Humiliation, Afraid, Rape, and Kick questionnaire    Fear of Current or Ex-Partner: No    Emotionally Abused: No    Physically Abused: No    Sexually Abused: No    PHYSICAL EXAM  GENERAL EXAM/CONSTITUTIONAL: Vitals:  There were no vitals filed for this visit.  There is no height or weight on file to calculate BMI. Wt Readings from Last 3 Encounters:  11/02/23 214 lb 4.8 oz (97.2 kg)  10/30/23 214 lb (97.1 kg)  10/24/23 214 lb (97.1 kg)   Patient is in no distress; well developed, nourished and groomed; neck is supple  MUSCULOSKELETAL: Gait, strength, tone, movements noted in Neurologic exam below  NEUROLOGIC: MENTAL STATUS:      No data to display         awake, alert, oriented to person, place and time recent and remote memory intact normal attention and concentration language fluent, comprehension intact, naming intact fund of knowledge appropriate  CRANIAL NERVE:  2nd - no papilledema or hemorrhages on fundoscopic exam 2nd, 3rd, 4th, 6th - pupils  equal and reactive to light, visual fields full to confrontation, extraocular muscles intact, no nystagmus 5th - facial sensation symmetric 7th - facial strength symmetric 8th - hearing intact 9th - palate elevates symmetrically, uvula midline 11th - shoulder shrug symmetric 12th - tongue protrusion midline  DIAGNOSTIC DATA (LABS, IMAGING, TESTING) - I reviewed patient records, labs, notes, testing and imaging myself where available.  Lab Results  Component Value Date   WBC 5.5 10/08/2023   HGB 10.3 (L) 10/08/2023   HCT 33.0 (L) 10/08/2023   MCV 81.9 10/08/2023   PLT 245 10/08/2023      Component Value Date/Time   NA 138 10/08/2023 1358   NA 136 07/19/2023 0917   K 3.9 10/08/2023 1358   CL 107 10/08/2023 1358   CO2 23 10/08/2023 1358   GLUCOSE 89 10/08/2023 1358   BUN 9 10/08/2023 1358   BUN 10 07/19/2023 0917   CREATININE 0.81 10/08/2023 1358   CALCIUM 9.0 10/08/2023 1358   PROT 6.4 (L) 10/08/2023 1358   PROT 6.3 07/19/2023 0917   ALBUMIN 3.2 (L) 10/08/2023 1358   ALBUMIN 3.6 (L) 07/19/2023 0917   AST 28 10/08/2023 1358   ALT 30 10/08/2023 1358   ALKPHOS 108 10/08/2023 1358   BILITOT 0.5 10/08/2023 1358   BILITOT <0.2 07/19/2023 0917   GFRNONAA >60 10/08/2023 1358   Lab Results  Component Value Date   CHOL 146 01/13/2023   HDL 48.90 01/13/2023   LDLCALC 87 01/13/2023   TRIG 51.0 01/13/2023   CHOLHDL 3 01/13/2023   Lab Results  Component Value Date   HGBA1C 5.7 (H) 04/19/2023   Lab Results  Component Value Date   VITAMINB12 241 12/04/2019   Lab Results  Component Value Date   TSH 2.91 01/13/2023   MRI Brain 11/02/2023 Unremarkable MRI appearance of the brain. No evidence of an acute intracranial abnormality.   MRV head 11/02/2023: No evidence of dural venous sinus thrombosis.   ASSESSMENT AND PLAN  23 y.o. year old female with history of anxiety depression who is presenting with new onset headaches following delivery, she had  epidural block and  emergent  C-section. She tells me the headaches are positional, better when laying down and worse when getting up and moving.  Her MRI brain and MRV did not show any acute abnormality.  Since patient is still symptomatic, we will proceed with a CT myelogram to rule out CSF leak.  Again headaches started post epidural anesthesia   1. Epidural anesthesia-induced headache during labor and delivery     Patient Instructions  CT myelogram to rule out CSF leak. I will contact you to go over the results.  Advised patient to consider Tylenol up to 2 g daily for headaches Continue to follow with PCP    Orders Placed This Encounter  Procedures   CT CERVICAL SPINE W CONTRAST   CT THORACIC SPINE W CONTRAST   CT LUMBAR SPINE W CONTRAST   DG Myelogram 2+ Regions    No orders of the defined types were placed in this encounter.   No follow-ups on file.  Virtual Visit via Video Note  I connected with  Orlin Hilding on 11/16/23 at  3:45 PM EST by a video enabled telemedicine application and verified that I am speaking with the correct person using two identifiers.  Location: Patient: Home  Provider: [facility] Office   I discussed the limitations of evaluation and management by telemedicine and the availability of in person appointments. The patient expressed understanding and agreed to proceed.   I discussed the assessment and treatment plan with the patient. The patient was provided an opportunity to ask questions and all were answered. The patient agreed with the plan and demonstrated an understanding of the instructions.   The patient was advised to call back or seek an in-person evaluation if the symptoms worsen or if the condition fails to improve as anticipated.  I provided 15 minutes of non-face-to-face time during this encounter.   I have spent a total of 30 minutes dedicated to this patient today, preparing to see patient, performing a medically appropriate examination and evaluation,  ordering tests and/or medications and procedures, and counseling and educating the patient/family/caregiver; independently interpreting result and communicating results to the family/patient/caregiver; and documenting clinical information in the electronic medical record.   Windell Norfolk, MD 11/16/2023, 4:30 PM  [facility] [address] [place], [place] [address] [phone]

## 2023-11-28 ENCOUNTER — Encounter: Payer: Self-pay | Admitting: Neurology

## 2023-11-28 NOTE — Discharge Instructions (Signed)

## 2023-11-29 ENCOUNTER — Encounter: Payer: Self-pay | Admitting: Neurology

## 2023-11-30 ENCOUNTER — Ambulatory Visit
Admission: RE | Admit: 2023-11-30 | Discharge: 2023-11-30 | Disposition: A | Discharge status: Home / Self Care | Source: Ambulatory Visit | Attending: Neurology | Admitting: Neurology

## 2023-11-30 ENCOUNTER — Other Ambulatory Visit: Payer: Medicaid Other

## 2023-11-30 ENCOUNTER — Ambulatory Visit
Admission: RE | Admit: 2023-11-30 | Discharge: 2023-11-30 | Disposition: A | Discharge status: Home / Self Care | Payer: Medicaid Other | Source: Ambulatory Visit | Attending: Neurology | Admitting: Neurology

## 2023-11-30 MED ORDER — IOPAMIDOL (ISOVUE-M 300) INJECTION 61%
10.0000 mL | Freq: Once | INTRAMUSCULAR | Status: AC
  Administered 2023-11-30: 10 mL via INTRATHECAL

## 2023-11-30 MED ORDER — DIAZEPAM 5 MG PO TABS
10.0000 mg | ORAL_TABLET | Freq: Once | ORAL | Status: AC
  Administered 2023-11-30: 5 mg via ORAL

## 2023-11-30 MED ORDER — MEPERIDINE HCL 50 MG/ML IJ SOLN
50.0000 mg | Freq: Once | INTRAMUSCULAR | Status: AC | PRN
  Administered 2023-11-30: 50 mg via INTRAMUSCULAR

## 2023-11-30 MED ORDER — ONDANSETRON HCL 4 MG/2ML IJ SOLN
4.0000 mg | Freq: Once | INTRAMUSCULAR | Status: AC | PRN
  Administered 2023-11-30: 4 mg via INTRAMUSCULAR

## 2023-11-30 NOTE — Progress Notes (Signed)
 Pt c/o 8/10 pain. Pain meds given-See MAR. Pt reported relief prior to D/C

## 2023-12-01 ENCOUNTER — Encounter: Payer: Self-pay | Admitting: Neurology

## 2023-12-01 ENCOUNTER — Other Ambulatory Visit: Payer: Self-pay | Admitting: Neurology

## 2023-12-01 DIAGNOSIS — G9600 Cerebrospinal fluid leak, unspecified: Secondary | ICD-10-CM

## 2023-12-02 ENCOUNTER — Other Ambulatory Visit: Payer: Self-pay

## 2023-12-02 ENCOUNTER — Emergency Department (HOSPITAL_COMMUNITY)
Admission: EM | Admit: 2023-12-02 | Discharge: 2023-12-03 | Disposition: A | Discharge status: Home / Self Care | Attending: Emergency Medicine | Admitting: Emergency Medicine

## 2023-12-02 DIAGNOSIS — R519 Headache, unspecified: Secondary | ICD-10-CM | POA: Diagnosis present

## 2023-12-02 DIAGNOSIS — R531 Weakness: Secondary | ICD-10-CM | POA: Diagnosis not present

## 2023-12-02 DIAGNOSIS — R29898 Other symptoms and signs involving the musculoskeletal system: Secondary | ICD-10-CM

## 2023-12-02 DIAGNOSIS — J45909 Unspecified asthma, uncomplicated: Secondary | ICD-10-CM | POA: Insufficient documentation

## 2023-12-02 DIAGNOSIS — G971 Other reaction to spinal and lumbar puncture: Secondary | ICD-10-CM | POA: Diagnosis not present

## 2023-12-02 DIAGNOSIS — I10 Essential (primary) hypertension: Secondary | ICD-10-CM | POA: Diagnosis not present

## 2023-12-02 DIAGNOSIS — M546 Pain in thoracic spine: Secondary | ICD-10-CM | POA: Diagnosis not present

## 2023-12-02 DIAGNOSIS — R000 Tachycardia, unspecified: Secondary | ICD-10-CM | POA: Diagnosis not present

## 2023-12-02 MED ORDER — METOCLOPRAMIDE HCL 5 MG/ML IJ SOLN
10.0000 mg | Freq: Once | INTRAMUSCULAR | Status: DC
  Filled 2023-12-02: qty 2

## 2023-12-02 MED ORDER — KETOROLAC TROMETHAMINE 15 MG/ML IJ SOLN
15.0000 mg | Freq: Once | INTRAMUSCULAR | Status: AC
  Administered 2023-12-02: 15 mg via INTRAVENOUS
  Filled 2023-12-02: qty 1

## 2023-12-02 NOTE — ED Provider Triage Note (Signed)
 Emergency Medicine Provider Triage Evaluation Note  Jessica Hendrix , a 23 y.o. female  was evaluated in triage.  Pt complains of headache, patient has known CSF leak, had blood patch procedure yesterday, has not having any improvement of her headache..  Review of Systems  Positive: Headache with photophobia Negative: Fever  Physical Exam  BP (!) 112/100 (BP Location: Right Arm)   Pulse 72   Temp 98.6 F (37 C) (Oral)   Resp 19   Ht 5\' 6"  (1.676 m)   Wt 96.2 kg   LMP 11/23/2023 (Approximate)   SpO2 100%   BMI 34.22 kg/m  Gen:   Awake, no distress   Resp:  Normal effort  MSK:   Moves extremities without difficulty  Other:    Medical Decision Making  Medically screening exam initiated at 10:42 PM.  Appropriate orders placed.  Earla Charlie was informed that the remainder of the evaluation will be completed by another provider, this initial triage assessment does not replace that evaluation, and the importance of remaining in the ED until their evaluation is complete.     Ma Rings, [place] 12/02/23 2245

## 2023-12-02 NOTE — ED Triage Notes (Signed)
 PT here via [facility] from home.  Pt was told on Thursday that she has been leaking spinal fluid since her epidural in December.  Thursday a myelogram was performed and during the process a nerve was nicked.  Pt has been experiencing increasing headache and photophobia as well as L leg numbness.  Pt able to ambulate to bathroom.

## 2023-12-03 ENCOUNTER — Emergency Department (HOSPITAL_COMMUNITY)

## 2023-12-03 DIAGNOSIS — G971 Other reaction to spinal and lumbar puncture: Secondary | ICD-10-CM | POA: Diagnosis not present

## 2023-12-03 MED ORDER — GADOBUTROL 1 MMOL/ML IV SOLN
10.0000 mL | Freq: Once | INTRAVENOUS | Status: AC | PRN
Start: 2023-12-03 — End: 2023-12-03
  Administered 2023-12-03: 10 mL via INTRAVENOUS

## 2023-12-03 MED ORDER — HYDROCODONE-ACETAMINOPHEN 5-325 MG PO TABS
1.0000 | ORAL_TABLET | Freq: Once | ORAL | Status: AC
  Administered 2023-12-03: 1 via ORAL
  Filled 2023-12-03: qty 1

## 2023-12-03 MED ORDER — ONDANSETRON HCL 4 MG/2ML IJ SOLN
4.0000 mg | Freq: Once | INTRAMUSCULAR | Status: AC
  Administered 2023-12-03: 4 mg via INTRAVENOUS
  Filled 2023-12-03: qty 2

## 2023-12-03 MED ORDER — SODIUM CHLORIDE 0.9 % IV BOLUS
1000.0000 mL | Freq: Once | INTRAVENOUS | Status: AC
Start: 2023-12-03 — End: 2023-12-03
  Administered 2023-12-03: 1000 mL via INTRAVENOUS

## 2023-12-03 NOTE — ED Provider Notes (Addendum)
 [place] EMERGENCY DEPARTMENT AT [facility] Provider Note   CSN: [id number] Arrival date & time: 12/02/23  2203     History  Chief Complaint  Patient presents with   Numbness   Headache    Jessica Hendrix is a 23 y.o. female.  She has past medical history of anxiety, depression, asthma, pyelonephritis and postpartum hypertension.  She presents today to the ER for evaluation of ongoing headache and left leg numbness and weakness.  Headache has been ongoing since she had delivered her child on September 15, 2023.  She had an epidural at that time.  So she did having persistent headaches.  She had a myelogram on 11/30/2022 that confirmed a CSF leak as the cause of her headache.  She is told she will need a blood patch but the person who her neurologist wanted to do it is not available soon enough.  She still waiting to hear about appointment for a blood patch but tonight was still having persistent headache.  She also states that yesterday when having the myelogram but they "nicked a nerve" and she had been having left leg numbness and weakness since.  She denies saddle anesthesia or paresthesia, no bowel or bladder incontinence.  She is able to ambulate but states she can tell that her left leg is weak.  Headache      Home Medications Prior to Admission medications   Medication Sig Start Date End Date Taking? Authorizing Provider  Blood Pressure Monitoring (BLOOD PRESSURE KIT) DEVI 1 Device by Does not apply route once a week. 03/13/23   Constant, Peggy, MD  cyclobenzaprine (FLEXERIL) 10 MG tablet Take 1 tablet (10 mg total) by mouth 3 (three) times daily as needed (headache). 09/19/23   Aviva Signs, CNM  gabapentin (NEURONTIN) 300 MG capsule Take 1 capsule (300 mg total) by mouth 3 (three) times daily. 10/22/23   Anyanwu, Jethro Bastos, MD  ketorolac (TORADOL) 10 MG tablet Take 1 tablet (10 mg total) by mouth every 6 (six) hours as needed. Do NOT take with ibuprofen 10/24/23    Leftwich-Kirby, Wilmer Floor, CNM  oxyCODONE (ROXICODONE) 5 MG immediate release tablet Take 1 tablet (5 mg total) by mouth every 6 (six) hours as needed for severe pain (pain score 7-10). 10/08/23   Joanne Gavel, MD  Prenatal Vit-Fe Fumarate-FA (PRENATAL VITAMIN PO) Take 1 tablet by mouth daily.    [provider]  senna-docusate (SENOKOT-S) 8.6-50 MG tablet Take 2 tablets by mouth daily. 09/18/23   Warren-Hill, Clarita Crane, CNM      Allergies    Cinnamon, Penicillins, and Citrus    Review of Systems   Review of Systems  Neurological:  Positive for headaches.    Physical Exam Updated Vital Signs BP (!) 113/53 (BP Location: Right Arm)   Pulse 62   Temp 98.3 F (36.8 C) (Oral)   Resp 16   Ht 5\' 6"  (1.676 m)   Wt 96.2 kg   LMP 11/23/2023 (Approximate)   SpO2 100%   BMI 34.22 kg/m  Physical Exam Vitals and nursing note reviewed.  Constitutional:      General: She is not in acute distress.    Appearance: She is well-developed.  HENT:     Head: Normocephalic and atraumatic.  Eyes:     Conjunctiva/sclera: Conjunctivae normal.  Cardiovascular:     Rate and Rhythm: Normal rate and regular rhythm.     Heart sounds: No murmur heard. Pulmonary:     Effort:  Pulmonary effort is normal. No respiratory distress.     Breath sounds: Normal breath sounds.  Abdominal:     Palpations: Abdomen is soft.     Tenderness: There is no abdominal tenderness.  Musculoskeletal:        General: No swelling.     Cervical back: Neck supple.  Skin:    General: Skin is warm and dry.     Capillary Refill: Capillary refill takes less than 2 seconds.  Neurological:     Mental Status: She is alert and oriented to person, place, and time.     GCS: GCS eye subscore is 4. GCS verbal subscore is 5. GCS motor subscore is 6.     Cranial Nerves: No dysarthria or facial asymmetry.     Comments: Like has decreased sensation and weakness.  Strength 5/5 in right lower extremity, 3/5 in left lower extremity   Psychiatric:        Mood and Affect: Mood normal.     ED Results / Procedures / Treatments   Labs (all labs ordered are listed, but only abnormal results are displayed) Labs Reviewed - No data to display   EKG None  Radiology MR Lumbar Spine W Wo Contrast Result Date: 12/03/2023 CLINICAL DATA:  23 year old female with positional headaches following epidural anesthesia for C-section, with questionable L3-L4 CSF leak on total spine CT myelogram 3 days ago. EXAM: MRI LUMBAR SPINE WITHOUT AND WITH CONTRAST TECHNIQUE: Multiplanar and multiecho pulse sequences of the lumbar spine were obtained without and with intravenous contrast. CONTRAST:  10mL GADAVIST GADOBUTROL 1 MMOL/ML IV SOLN COMPARISON:  Total spine CT myelogram 11/30/2023. FINDINGS: Segmentation: Designated as normal with hypoplastic ribs at T12 as on the recent comparison. Alignment:  Stable and essentially normal lumbar lordosis. Vertebrae: Maintained vertebral height. Visualized bone marrow signal is within normal limits. No marrow edema or evidence of acute osseous abnormality. Visible sacrum and SI joints appear intact. Conus medullaris and cauda equina: Conus extends to the T12-L1 level. No lower spinal cord or conus signal abnormality. No abnormal intradural enhancement. No dural thickening. Paraspinal and other soft tissues: Substantially motion degraded precontrast T1 axial imaging. Negative visible abdominal viscera. Minimal L3-L4 level subcutaneous soft tissue heterogeneity posterior to the spine which might be related to the recent lumbar puncture. The epidural space visible on motion degraded axial T1 weighted imaging seems to remain normal. Likewise, sagittal T1 epidural space appears normal and that sequences of good image quality. Disc levels: Indistinct appearance of the cauda equina nerve roots throughout, although appeared normal on the recent myelogram. But no discrete nerve root thickening or enhancement. Subtle evidence of  trace posterior subdural space fluid dorsal to the conus medullaris (series 5, image 8). No larger rim enhancing fluid collection is evident in the thecal sac. Normal intervertebral disc signal and morphology except at L4-L5. L4-L5: Disc desiccation, circumferential disc bulge mildly asymmetric to the left. Small broad-based central disc protrusion. Mild facet hypertrophy. No significant stenosis. IMPRESSION: 1. Tiny dorsal subdural fluid collection at the thoracolumbar junction and indistinct appearance of the cauda equina nerve roots following recent total spine Myelography, probably are incidental findings. No abnormal thecal sac or nerve root enhancement. No evidence of an epidural process. No evidence of spinal infection. 2. Stable isolated L4-L5 disc degeneration with no significant stenosis. Electronically Signed   By: Odessa Fleming M.D.   On: 12/03/2023 05:59    Procedures Procedures    Medications Ordered in ED Medications  metoCLOPramide (REGLAN) injection 10 mg (  10 mg Intravenous Patient Refused/Not Given 12/02/23 2337)  ketorolac (TORADOL) 15 MG/ML injection 15 mg (15 mg Intravenous Given 12/02/23 2337)  sodium chloride 0.9 % bolus 1,000 mL (0 mLs Intravenous Stopped 12/03/23 0649)  ondansetron (ZOFRAN) injection 4 mg (4 mg Intravenous Given 12/03/23 0419)  HYDROcodone-acetaminophen (NORCO/VICODIN) 5-325 MG per tablet 1 tablet (1 tablet Oral Given 12/03/23 0546)  gadobutrol (GADAVIST) 1 MMOL/ML injection 10 mL (10 mLs Intravenous Contrast Given 12/03/23 0519)    ED Course/ Medical Decision Making/ A&P Clinical Course as of 12/03/23 0650  Wynelle Link Dec 03, 2023  0404 Patient has known CSF leak and had myelogram on 11/30/2023.  Her concern is that she having left leg numbness and weakness since the myelogram.  She does have objective weakness and decree sensation on exam.  Discussed with neurology who recommended MRI with and without contrast.  Her only puncture was in the lumbar area so will only MRI lumbar  spine especially as this is distribution of her symptoms.  Her headache improved significantly with Toradol.  She is having some nausea, declined Reglan as part of headache cocktail as she states it makes her feel poorly so she is given Zofran. [CB]    Clinical Course User Index [CB] Ma Rings, PA-C                                 Medical Decision Making DDx: CSF leak, migraine, tension headache, Intracranial hemorrhage, cluster headache, medication overuse headache, intracranial mass, temporal arteritis, other; dural abscess, epidural hematoma, sciatica, other ED course: Has already had her headache worked up and had myelogram showing a CSF leak.  Awaiting follow-up for blood patch at this time.  She is been taking Excedrin at home with some mild relief.  She got Toradol in the ED with moderate relief of her symptoms.  Regarding her left leg numbness and weakness, your MRI did not show any acute findings, advised to follow-up with neurology regarding symptoms.  Amount and/or Complexity of Data Reviewed External Data Reviewed: labs, radiology and notes.    Details: MRI and MRV reviewed which were normal. Labs: ordered. Radiology: ordered.  Risk Prescription drug management.           Final Clinical Impression(s) / ED Diagnoses Final diagnoses:  Spinal puncture headache  Left leg weakness    Rx / DC Orders ED Discharge Orders     None         Ma Rings, PA-C 12/03/23 1610    Ma Rings, PA-C 12/03/23 9604    Tilden Fossa, MD 12/03/23 [phone]

## 2023-12-03 NOTE — Discharge Instructions (Signed)
 Blood Patch Discharge Instructions ? ?Go home and rest quietly for the next 24 hours.  It is important to lie flat for the next 24 hours.  Get up only to go to the restroom.  You may lie in the bed or on a couch on your back, your stomach, your left side or your right side.  You may have one pillow under your head.  You may have pillows between your knees while you are on your side or under your knees while you are on your back. ? ?DO NOT drive today.  Recline the seat as far back as it will go, while still wearing your seat belt, on the way home. ? ?You may get up to go to the bathroom as needed.  You may sit up for 10 minutes to eat.  You may resume your normal diet and medications unless otherwise indicated.  Drink lots of extra fluids today and tomorrow..  ? ?You may resume normal activities after your 24 hours of bed rest is over; however, do not exert yourself strongly or do any heavy lifting tomorrow. ? ?Call your physician for a follow-up appointment.  ? ?If you have any questions  after you arrive home, please call [phone]. ? ?Discharge instructions have been explained to the patient.  The patient, or the person responsible for the patient, fully understands these instructions. ? ?   ?

## 2023-12-04 ENCOUNTER — Ambulatory Visit
Admission: RE | Admit: 2023-12-04 | Discharge: 2023-12-04 | Disposition: A | Discharge status: Home / Self Care | Source: Ambulatory Visit | Attending: Neurology

## 2023-12-04 DIAGNOSIS — G9600 Cerebrospinal fluid leak, unspecified: Secondary | ICD-10-CM

## 2023-12-04 MED ORDER — IOPAMIDOL (ISOVUE-M 200) INJECTION 41%
1.0000 mL | Freq: Once | INTRAMUSCULAR | Status: AC
  Administered 2023-12-04: 1 mL via EPIDURAL

## 2023-12-04 NOTE — Progress Notes (Addendum)
 20cc blood collected from pts Right AC prior to blood patch procedure. Pt tolerated well. 1 successful attempt. Gauze and tape applied after.  Previous blood clotted, 20cc collected from pts Left AC. Pt tolerated well. 1 successful attempt. Gauze and tape applied after.

## 2023-12-04 NOTE — Discharge Instructions (Addendum)
 Blood Patch Discharge Instructions ? ?Go home and rest quietly for the next 24 hours.  It is important to lie flat for the next 24 hours.  Get up only to go to the restroom.  You may lie in the bed or on a couch on your back, your stomach, your left side or your right side.  You may have one pillow under your head.  You may have pillows between your knees while you are on your side or under your knees while you are on your back. ? ?DO NOT drive today.  Recline the seat as far back as it will go, while still wearing your seat belt, on the way home. ? ?You may get up to go to the bathroom as needed.  You may sit up for 10 minutes to eat.  You may resume your normal diet and medications unless otherwise indicated.  Drink lots of extra fluids today and tomorrow..  ? ?You may resume normal activities after your 24 hours of bed rest is over; however, do not exert yourself strongly or do any heavy lifting tomorrow. ? ?Call your physician for a follow-up appointment.  ? ?If you have any questions  after you arrive home, please call [phone]. ? ?Discharge instructions have been explained to the patient.  The patient, or the person responsible for the patient, fully understands these instructions. ? ?   ?

## 2024-01-02 ENCOUNTER — Encounter: Payer: Self-pay | Admitting: Obstetrics and Gynecology

## 2024-01-02 ENCOUNTER — Ambulatory Visit (INDEPENDENT_AMBULATORY_CARE_PROVIDER_SITE_OTHER): Admitting: Obstetrics and Gynecology

## 2024-01-02 VITALS — BP 110/74 | HR 92 | Wt 219.0 lb

## 2024-01-02 DIAGNOSIS — N921 Excessive and frequent menstruation with irregular cycle: Secondary | ICD-10-CM | POA: Diagnosis not present

## 2024-01-02 DIAGNOSIS — Z975 Presence of (intrauterine) contraceptive device: Secondary | ICD-10-CM

## 2024-01-02 DIAGNOSIS — Z5320 Procedure and treatment not carried out because of patient's decision for unspecified reasons: Secondary | ICD-10-CM

## 2024-01-02 DIAGNOSIS — L905 Scar conditions and fibrosis of skin: Secondary | ICD-10-CM

## 2024-01-02 MED ORDER — IBUPROFEN 600 MG PO TABS: 600.0000 mg | ORAL_TABLET | Freq: Three times a day (TID) | ORAL | 0 refills | Status: AC

## 2024-01-02 NOTE — Progress Notes (Signed)
   ESTABLISHED GYNECOLOGY VISIT Chief Complaint  Patient presents with   Menstrual Problem    Subjective:  Jessica Hendrix is a 23 y.o. G1P0101 presenting for breakthrough bleeding with nexplanon  Had postpartum nexplanon placed two months ago. Notes irregular bleeding since then. Bled for 4 weeks straight, had a week with no bleeding, then had bleeding again for 3 weeks. Currently not bleeding.  Also notes irritation of her cesarean scar when clothing rubs against it.    Review of Systems:   Pertinent items are noted in HPI  Pertinent History Reviewed:  Reviewed past medical,surgical, social and family history.  Reviewed problem list, medications and allergies.  Objective:   Vitals:   01/02/24 1611  BP: 110/74  Pulse: 92  Weight: 219 lb (99.3 kg)   Physical Examination:   General appearance - well appearing, and in no distress  Mental status - alert, oriented to person, place, and time  Psych:  normal mood and affect  Skin - warm and dry, normal color, no suspicious lesions noted  Abdomen - soft, nontender, nondistended, no masses or organomegaly  Incision- pfannenstiel incision clean/dry/intact, slight keloid appearance  Pelvic - deferred  Extremities:  No swelling or varicosities noted  Chaperone present for exam  Assessment and Plan:  Jessica Hendrix is a 23 y.o. with breakthrough bleeding  1. Breakthrough bleeding on Nexplanon (Primary) Counseled on normal side effect profile of nexplanon and breakthrough bleeding can be normal and often improves with time. Offered NSAID trial which she is interested in. Has f/u next month for annual visit. Will reassess at that time. Consider OCP add on if not improved at that time. - ibuprofen (ADVIL) 600 MG tablet; Take 1 tablet (600 mg total) by mouth 3 (three) times daily for 7 days.  Dispense: 21 tablet; Refill: 0  2. Scar irritation Normal appearance to scar apart from small keloid. Recommend avoiding tight fitting  clothing and edges of pants or underwear rubbing the area. Should improve with time.  3. Pap smear of cervix declined Declines pap today    No follow-ups on file.  Future Appointments  Date Time Provider Department Center  01/30/2024  2:10 PM Leftwich-Kirby, Wilmer Floor, CNM [facility] None    Wanita Chamberlain, MD, FACOG Obstetrician & Gynecologist, [facility] for [facility], [facility]

## 2024-01-02 NOTE — Progress Notes (Signed)
 Pt is in office for incision check, pt states some tissue is larger and irritated. Pt had c/s 09/15/23. Pt would also like to discuss possibly using a pill with Nexplanon to control cycles.

## 2024-01-30 ENCOUNTER — Encounter: Payer: Self-pay | Admitting: Advanced Practice Midwife

## 2024-01-30 ENCOUNTER — Ambulatory Visit (INDEPENDENT_AMBULATORY_CARE_PROVIDER_SITE_OTHER): Admitting: Advanced Practice Midwife

## 2024-01-30 ENCOUNTER — Other Ambulatory Visit (HOSPITAL_COMMUNITY)
Admission: RE | Admit: 2024-01-30 | Discharge: 2024-01-30 | Disposition: A | Discharge status: Home / Self Care | Source: Ambulatory Visit | Attending: Advanced Practice Midwife | Admitting: Advanced Practice Midwife

## 2024-01-30 VITALS — BP 130/71 | HR 80 | Ht 66.0 in | Wt 229.8 lb

## 2024-01-30 DIAGNOSIS — Z3202 Encounter for pregnancy test, result negative: Secondary | ICD-10-CM | POA: Diagnosis not present

## 2024-01-30 DIAGNOSIS — Z01419 Encounter for gynecological examination (general) (routine) without abnormal findings: Secondary | ICD-10-CM

## 2024-01-30 DIAGNOSIS — Z124 Encounter for screening for malignant neoplasm of cervix: Secondary | ICD-10-CM

## 2024-01-30 DIAGNOSIS — Z3046 Encounter for surveillance of implantable subdermal contraceptive: Secondary | ICD-10-CM

## 2024-01-30 DIAGNOSIS — N921 Excessive and frequent menstruation with irregular cycle: Secondary | ICD-10-CM | POA: Insufficient documentation

## 2024-01-30 DIAGNOSIS — Z975 Presence of (intrauterine) contraceptive device: Secondary | ICD-10-CM

## 2024-01-30 DIAGNOSIS — Z3200 Encounter for pregnancy test, result unknown: Secondary | ICD-10-CM

## 2024-01-30 DIAGNOSIS — Z113 Encounter for screening for infections with a predominantly sexual mode of transmission: Secondary | ICD-10-CM | POA: Diagnosis not present

## 2024-01-30 NOTE — Progress Notes (Unsigned)
 Pt requesting pregnancy test due to symptoms, but does have a nexplanon .(POCT;  negative)  Need update to pt procedure list; pt membrane punctured during epidural.  Pt declines STD testing   No other concerns at this time.

## 2024-01-30 NOTE — Progress Notes (Unsigned)
   Subjective:     Jessica Hendrix is a 23 y.o. female here at [facility] for a routine exam.  Current complaints: light vaginal bleeding off and on on most days, with no more than 1 week break without bleeding.   Nexplanon  since PP visit on 11/02/2023.  Personal and family health history reviewed: yes.  Do you have a primary care provider? yes Do you feel safe at home? yes  Flowsheet Row Office Visit from 01/30/2024 in [facility] Total Score 0       Health Maintenance Due  Topic Date Due   Meningococcal B Vaccine (1 of 2 - Standard) Never done   DTaP/Tdap/Td (1 - Tdap) Never done   Pneumococcal Vaccine 37-67 Years old (1 of 2 - PCV) Never done   COVID-19 Vaccine (3 - 2024-25 season) 05/28/2023   Cervical Cancer Screening (Pap smear)  Never done     Risk factors for chronic health problems: Smoking: Alchohol/how much: Pt BMI: Body mass index is 37.09 kg/m.   Gynecologic History No LMP recorded (lmp unknown). Contraception: Nexplanon  Last Pap: n/a.  Last mammogram: n/a.   Obstetric History OB History  Gravida Para Term Preterm AB Living  1 1 0 1 0 1  SAB IAB Ectopic Multiple Live Births  0 0 0 0 1    # Outcome Date GA Lbr Len/2nd Weight Sex Type Anes PTL Lv  1 Preterm 09/15/23 [redacted]w[redacted]d  6 lb 5.9 oz (2.89 kg) M CS-LTranv EPI  LIV     The following portions of the patient's history were reviewed and updated as appropriate: allergies, current medications, past family history, past medical history, past social history, past surgical history, and problem list.  Review of Systems Pertinent items noted in HPI and remainder of comprehensive ROS otherwise negative.    Objective:   Today's Vitals   01/30/24 1355  BP: 130/71  Pulse: 80  Weight: 229 lb 12.8 oz (104.2 kg)  Height: 5\' 6"  (1.676 m)   Body mass index is 37.09 kg/m.  VS reviewed, nursing note reviewed,  Constitutional: well developed, well nourished, no  distress HEENT: normocephalic, thyroid  without enlargement or mass HEART: RRR, no murmurs rubs/gallops RESP: clear and equal to auscultation bilaterally in all lobes  Breast Exam: Deferred with low risks and shared decision making, discussed recommendation to start mammogram between 40-50 yo/  Abdomen: soft Neuro: alert and oriented x 3 Skin: warm, dry Psych: affect normal Pelvic exam: Performed: Cervix pink, visually closed, without lesion, scant white creamy discharge, vaginal walls and external genitalia normal Bimanual exam: Cervix 0/long/high, firm, anterior, neg CMT, uterus nontender, nonenlarged, adnexa without tenderness, enlargement, or mass        Assessment/Plan:   1. Encounter for annual routine gynecological examination  - Cytology - PAP( [facility]) - Cervicovaginal ancillary only( [facility])  2. Possible pregnancy, not confirmed (Primary) --Pt with nausea, bloating, irregular light bleeding --UPT negative - POCT urine pregnancy  3. Screening for cervical cancer --Pap today  4. Breakthrough bleeding on Nexplanon  --Discussed options for irregular bleeding.  Pt to try OCPs x 1 month, or up to 3 months to see if bleeding improves.  Nextstellis samples from office, Rx if bleeding returns    Return in about 3 months (around 05/01/2024) for Gyn follow up for contraception.   Arlester Bence, CNM 5:59 PM

## 2024-01-31 LAB — CERVICOVAGINAL ANCILLARY ONLY
Bacterial Vaginitis (gardnerella): NEGATIVE
Candida Glabrata: NEGATIVE
Candida Vaginitis: NEGATIVE
Chlamydia: NEGATIVE
Comment: NEGATIVE
Comment: NEGATIVE
Comment: NEGATIVE
Comment: NEGATIVE
Comment: NEGATIVE
Comment: NORMAL
Neisseria Gonorrhea: NEGATIVE
Trichomonas: NEGATIVE

## 2024-01-31 LAB — POCT URINE PREGNANCY: Preg Test, Ur: NEGATIVE

## 2024-01-31 LAB — CYTOLOGY - PAP: Diagnosis: NEGATIVE

## 2024-02-02 ENCOUNTER — Encounter: Payer: Self-pay | Admitting: Family Medicine

## 2024-02-02 ENCOUNTER — Ambulatory Visit (INDEPENDENT_AMBULATORY_CARE_PROVIDER_SITE_OTHER): Admitting: Family Medicine

## 2024-02-02 VITALS — BP 110/58 | HR 80 | Temp 98.2°F | Resp 18 | Ht 66.0 in | Wt 228.2 lb

## 2024-02-02 DIAGNOSIS — D649 Anemia, unspecified: Secondary | ICD-10-CM

## 2024-02-02 DIAGNOSIS — R030 Elevated blood-pressure reading, without diagnosis of hypertension: Secondary | ICD-10-CM | POA: Diagnosis not present

## 2024-02-02 DIAGNOSIS — R5383 Other fatigue: Secondary | ICD-10-CM

## 2024-02-02 LAB — TSH: TSH: 1.99 u[IU]/mL (ref 0.35–5.50)

## 2024-02-02 LAB — HEMOGLOBIN A1C: Hgb A1c MFr Bld: 6 % (ref 4.6–6.5)

## 2024-02-02 LAB — COMPREHENSIVE METABOLIC PANEL WITH GFR
ALT: 22 U/L (ref 0–35)
AST: 19 U/L (ref 0–37)
Albumin: 4 g/dL (ref 3.5–5.2)
Alkaline Phosphatase: 82 U/L (ref 39–117)
BUN: 11 mg/dL (ref 6–23)
CO2: 26 meq/L (ref 19–32)
Calcium: 9.5 mg/dL (ref 8.4–10.5)
Chloride: 107 meq/L (ref 96–112)
Creatinine, Ser: 0.84 mg/dL (ref 0.40–1.20)
GFR: 99.49 mL/min (ref >60.00)
Glucose, Bld: 103 mg/dL — ABNORMAL HIGH (ref 70–99)
Potassium: 4.3 meq/L (ref 3.5–5.1)
Sodium: 140 meq/L (ref 135–145)
Total Bilirubin: 0.3 mg/dL (ref 0.2–1.2)
Total Protein: 7.1 g/dL (ref 6.0–8.3)

## 2024-02-02 LAB — CBC WITH DIFFERENTIAL/PLATELET
Basophils Absolute: 0 10*3/uL (ref 0.0–0.1)
Basophils Relative: 0.6 % (ref 0.0–3.0)
Eosinophils Absolute: 0.1 10*3/uL (ref 0.0–0.7)
Eosinophils Relative: 1.1 % (ref 0.0–5.0)
HCT: 35.6 % — ABNORMAL LOW (ref 36.0–46.0)
Hemoglobin: 11.1 g/dL — ABNORMAL LOW (ref 12.0–15.0)
Lymphocytes Relative: 33.5 % (ref 12.0–46.0)
Lymphs Abs: 2.3 10*3/uL (ref 0.7–4.0)
MCHC: 31.1 g/dL (ref 30.0–36.0)
MCV: 71.9 fl — ABNORMAL LOW (ref 78.0–100.0)
Monocytes Absolute: 0.4 10*3/uL (ref 0.1–1.0)
Monocytes Relative: 6.2 % (ref 3.0–12.0)
Neutro Abs: 4.1 10*3/uL (ref 1.4–7.7)
Neutrophils Relative %: 58.6 % (ref 43.0–77.0)
Platelets: 224 10*3/uL (ref 150.0–400.0)
RBC: 4.95 Mil/uL (ref 3.87–5.11)
RDW: 17 % — ABNORMAL HIGH (ref 11.5–15.5)
WBC: 7 10*3/uL (ref 4.0–10.5)

## 2024-02-02 LAB — IBC + FERRITIN
Ferritin: 4.2 ng/mL — ABNORMAL LOW (ref 10.0–291.0)
Iron: 28 ug/dL — ABNORMAL LOW (ref 42–145)
Saturation Ratios: 6.5 % — ABNORMAL LOW (ref 20.0–50.0)
TIBC: 434 ug/dL (ref 250.0–450.0)
Transferrin: 310 mg/dL (ref 212.0–360.0)

## 2024-02-02 LAB — VITAMIN B12: Vitamin B-12: 297 pg/mL (ref 211–911)

## 2024-02-02 NOTE — Patient Instructions (Signed)
 It was very nice to see you today!  Magnesium glycinate daily Take prenatal daily Exercise, diet.  Monitor blood pressures.  Goal 120/<80   PLEASE NOTE:  If you had any lab tests please let us  know if you have not heard back within a few days. You may see your results on MyChart before we have a chance to review them but we will give you a call once they are reviewed by us . If we ordered any referrals today, please let us  know if you have not heard from their office within the next week.   Please try these tips to maintain a healthy lifestyle:  Eat most of your calories during the day when you are active. Eliminate processed foods including packaged sweets (pies, cakes, cookies), reduce intake of potatoes, white bread, white pasta, and white rice. Look for whole grain options, oat flour or almond flour.  Each meal should contain half fruits/vegetables, one quarter protein, and one quarter carbs (no bigger than a computer mouse).  Cut down on sweet beverages. This includes juice, soda, and sweet tea. Also watch fruit intake, though this is a healthier sweet option, it still contains natural sugar! Limit to 3 servings daily.  Drink at least 1 glass of water  with each meal and aim for at least 8 glasses per day  Exercise at least 150 minutes every week.

## 2024-02-02 NOTE — Progress Notes (Signed)
 Subjective:     Patient ID: Jessica Hendrix, female    DOB: 04-23-2001, 23 y.o.   MRN: [id number]  Chief Complaint  Patient presents with   Hypertension    Follow-up on blood pressure Having pressure in head and ears    HPI Discussed the use of AI scribe software for clinical note transcription with the patient, who gave verbal consent to proceed.  History of Present Illness Jessica Hendrix is a 23 year old female who presents with concerns about elevated blood pressure postpartum. She is accompanied by her four-month-old son, Jessica Hendrix and partner.  She has been experiencing elevated blood pressure since her pregnancy, with readings typically in the high 120s to 130s, although it was recently recorded at 110. She has not been monitoring her blood pressure at home. There is no history of hypertension prior to pregnancy.  She experiences pressure in her head and ears when transitioning from lying down to standing, which improves gradually after standing. She denies chest pain or palpitations. She has a history of headaches attributed to sinus issues, previously treated with nasal spray for allergies.  She had an emergency C-section with complications from the epidural, leading to a spinal fluid leak that required a blood patch and myelogram. She has been experiencing significant vaginal bleeding and side effects from the Nexplanon  implant, leading to the addition of another birth control pill four days ago to regulate her symptoms.  She has not been exercising due to pain when sitting up but has been trying to increase her activity by walking around stores. Her appetite has been poor, and she has not been eating a balanced diet. She reports weight gain since starting the birth control pill and is concerned about her diet, which has been high in starches(had to be durning pregnancy for hyperemesis).  She is currently working from home as a Engineer, production for [employer], a  position she obtained through her mother.  She has been breastfeeding less frequently due to difficulties with pumping and her son's preference for bottle feeding.  She has a history of anemia and was previously on iron supplements, which she discontinued due to constipation. She was suspected to have gestational diabetes during pregnancy because her blood sugar was elevated due to a diet high in starches, but after testing, it was attributed to her diet.    There are no preventive care reminders to display for this patient.  Past Medical History:  Diagnosis Date   Allergy    Anemia    Anxiety    Asthma    Constipation    Depression    Depression, major, single episode, moderate (HCC) 12/05/2019   GAD (generalized anxiety disorder) 03/08/2020   Gestational thrombocytopenia, third trimester (HCC) 07/20/2023   Postpartum hypertension 09/19/2023   Pyelonephritis    Twitch 12/05/2019   UTI (urinary tract infection)     Past Surgical History:  Procedure Laterality Date   CESAREAN SECTION N/A 09/15/2023   Procedure: CESAREAN SECTION;  Surgeon: Izell Marsh, MD;  Location: [facility] LD ORS;  Service: Obstetrics;  Laterality: N/A;   WISDOM TOOTH EXTRACTION       Current Outpatient Medications:    Blood Pressure Monitoring (BLOOD PRESSURE KIT) DEVI, 1 Device by Does not apply route once a week., Disp: 1 each, Rfl: 0   Prenatal Vit-Fe Fumarate-FA (PRENATAL VITAMIN PO), Take 1 tablet by mouth daily., Disp: , Rfl:    cyclobenzaprine  (FLEXERIL ) 10 MG tablet, Take 1 tablet (  10 mg total) by mouth 3 (three) times daily as needed (headache). (Patient not taking: Reported on 02/02/2024), Disp: 30 tablet, Rfl: 0   senna-docusate (SENOKOT-S) 8.6-50 MG tablet, Take 2 tablets by mouth daily. (Patient not taking: Reported on 02/02/2024), Disp: 60 tablet, Rfl: 0  Allergies  Allergen Reactions   Cinnamon     spices   Penicillins Other (See Comments)    unk   Citrus Rash   ROS neg/noncontributory  except as noted HPI/below      Objective:      BP (!) 110/58   Pulse 80   Temp 98.2 F (36.8 C) (Temporal)   Resp 18   Ht 5\' 6"  (1.676 m)   Wt 228 lb 4 oz (103.5 kg)   LMP  (LMP Unknown)   SpO2 96%   Breastfeeding Yes   BMI 36.84 kg/m  Wt Readings from Last 3 Encounters:  02/02/24 228 lb 4 oz (103.5 kg)  01/30/24 229 lb 12.8 oz (104.2 kg)  01/02/24 219 lb (99.3 kg)    Physical Exam   Gen: WDWN NAD.  Repeat bp's 100/60 and upon standing HEENT: NCAT, conjunctiva not injected, sclera nonicteric.  PERRL, fundi benign NECK:  supple, no thyromegaly, no nodes, no carotid bruits CARDIAC: RRR, S1S2+, no murmur. DP 2+B LUNGS: CTAB. No wheezes ABDOMEN:  BS+, soft, NTND, No HSM, no masses EXT:  no edema MSK: no gross abnormalities.  NEURO: A&O x3.  CN II-XII intact.  PSYCH: normal mood. Good eye contact     Assessment & Plan:  Elevated blood pressure reading  Other fatigue -     CBC with Differential/Platelet -     Comprehensive metabolic panel with GFR -     Hemoglobin A1c -     TSH -     Vitamin B12 -     IBC + Ferritin  Anemia, unspecified type -     CBC with Differential/Platelet -     Vitamin B12 -     IBC + Ferritin  Assessment and Plan Assessment & Plan  Postpartum Hypertension   Her postpartum blood pressure readings range from 120s-130s/70s-80s, with a current reading of 110/60. She experiences head pressure when standing, possibly due to sinus issues or anemia. There is no history of hypertension prior to pregnancy. Discussed the potential impact of birth control on blood pressure and emphasized lifestyle modifications. She should monitor blood pressure at home regularly, reduce dietary salt intake, increase physical activity with short walks, adopt a healthier diet with reduced starches and sugars, and monitor for symptoms of elevated blood pressure. F/u 1 mo but let us  know if issues  Anemia   She has anemia with previous iron supplementation causing  constipation, which may contribute to head pressure symptoms when standing. Blood work is needed to assess current anemia status. Order blood work and consider iron supplementation if indicated.  General Health Maintenance   Emphasized lifestyle modifications for overall health and blood pressure management, including a balanced diet and regular physical activity. Encourage regular physical activity, promote a balanced diet with reduced starches and sugars, ensure adequate hydration, and consider setting reminders for daily prenatal vitamin intake.  Hyperemesis Gravidarum   She experienced severe nausea and vomiting during pregnancy but reports no current symptoms. Has continued on high starch diet-advised needs to decrease for weight.   Spinal Fluid Leak Post-Epidural   She had a spinal fluid leak post-epidural with subsequent blood patch and myelogram procedures. No current symptoms are reported.  Return in about 4 weeks (around 03/01/2024) for blood pressure, wt.  Ellsworth Haas, MD

## 2024-02-06 ENCOUNTER — Ambulatory Visit: Payer: Self-pay | Admitting: Family Medicine

## 2024-02-06 NOTE — Progress Notes (Signed)
 1.  A1C(3 month average of sugars) is elevated.  This is considered PreDiabetes.  Work on diet-decrease sugars and starches and aim for 30 minutes of exercise 5 days/week to prevent progression to diabetes  2.  Mild anemia-see below 3.  Your Vitamin B12 is low or low normal-please take B12 vitamins 1000mcg(B complex vitamins are fine as well).  4.   Iron low-needs to take iron every other day and may need to take stool softener as got constipated in past, but iron is low and stores are VERY low

## 2024-03-01 ENCOUNTER — Ambulatory Visit: Admitting: Family Medicine

## 2024-03-04 ENCOUNTER — Ambulatory Visit (INDEPENDENT_AMBULATORY_CARE_PROVIDER_SITE_OTHER): Admitting: Family Medicine

## 2024-03-04 ENCOUNTER — Encounter: Payer: Self-pay | Admitting: Family Medicine

## 2024-03-04 VITALS — BP 107/61 | HR 96 | Ht 64.0 in | Wt 224.0 lb

## 2024-03-04 DIAGNOSIS — G8929 Other chronic pain: Secondary | ICD-10-CM

## 2024-03-04 DIAGNOSIS — R030 Elevated blood-pressure reading, without diagnosis of hypertension: Secondary | ICD-10-CM

## 2024-03-04 DIAGNOSIS — M545 Low back pain, unspecified: Secondary | ICD-10-CM

## 2024-03-04 NOTE — Progress Notes (Signed)
 Subjective:     Patient ID: Jessica Hendrix, female    DOB: October 01, 2000, 23 y.o.   MRN: [id number]  Chief Complaint  Patient presents with   Follow-up    4wk for BP and wt; doing okay, no concerns    HPI Discussed the use of AI scribe software for clinical note transcription with the patient, who gave verbal consent to proceed.  History of Present Illness Jessica Hendrix is a 23 year old female who presents for blood pressure monitoring and back pain management. She is accompanied by her child and partner  Her blood pressure has been fluctuating, initially ranging from 110 to 130 mmHg, but has stabilized in the 120s over the past two weeks. She is not currently taking any medication for blood pressure management. She has been inconsistent with her second birth control pill, Nextstellis, which she takes alongside the Nexplanon  implant, and is unsure if this inconsistency affects her blood pressure.  She is on both Nexplanon  and Nextstellis due to persistent bleeding caused by the Nexplanon  implant and for management of her PCOS. She is on a three-month trial to assess the effectiveness of this regimen.  She experiences back pain localized to the lower back, particularly where she had an epidural and two other procedures. The pain does not radiate down her legs and limits her ability to bend, turn, and twist, although she is able to walk and perform light activities. She has not engaged in physical therapy for her back pain.  She is not currently breastfeeding her almost six-month-old child. She has been focusing on healthy eating and reports feeling unwell when eating out, indicating a preference for home-cooked meals. She is engaging in more walking as a form of exercise, although her back pain limits more strenuous activities.    Health Maintenance Due  Topic Date Due   COVID-19 Vaccine (3 - 2024-25 season) 05/28/2023    Past Medical History:  Diagnosis Date   Allergy    Anemia     Anxiety    Asthma    Constipation    Depression    Depression, major, single episode, moderate (HCC) 12/05/2019   GAD (generalized anxiety disorder) 03/08/2020   Gestational thrombocytopenia, third trimester (HCC) 07/20/2023   Postpartum hypertension 09/19/2023   Pyelonephritis    Twitch 12/05/2019   UTI (urinary tract infection)     Past Surgical History:  Procedure Laterality Date   CESAREAN SECTION N/A 09/15/2023   Procedure: CESAREAN SECTION;  Surgeon: Izell Marsh, MD;  Location: [facility] LD ORS;  Service: Obstetrics;  Laterality: N/A;   WISDOM TOOTH EXTRACTION       Current Outpatient Medications:    Blood Pressure Monitoring (BLOOD PRESSURE KIT) DEVI, 1 Device by Does not apply route once a week., Disp: 1 each, Rfl: 0   Drospirenone-Estetrol (NEXTSTELLIS) 3-14.2 MG TABS, Take by mouth., Disp: , Rfl:    Prenatal Vit-Fe Fumarate-FA (PRENATAL VITAMIN PO), Take 1 tablet by mouth daily., Disp: , Rfl:    senna-docusate (SENOKOT-S) 8.6-50 MG tablet, Take 2 tablets by mouth daily., Disp: 60 tablet, Rfl: 0  Allergies  Allergen Reactions   Cinnamon     spices   Penicillins Other (See Comments)    unk   Citrus Rash   ROS neg/noncontributory except as noted HPI/below      Objective:      BP 107/61   Pulse 96   Ht 5\' 4"  (1.626 m)   Wt 224 lb (101.6 kg)  SpO2 97%   Breastfeeding No   BMI 38.45 kg/m  Wt Readings from Last 3 Encounters:  03/04/24 224 lb (101.6 kg)  02/02/24 228 lb 4 oz (103.5 kg)  01/30/24 229 lb 12.8 oz (104.2 kg)    Physical Exam   Gen: WDWN NAD HEENT: NCAT, conjunctiva not injected, sclera nonicteric NECK:  supple, no thyromegaly, no nodes, no carotid bruits CARDIAC: RRR, S1S2+, no murmur LUNGS: CTAB. No wheezes EXT:  no edema MSK: no gross abnormalities.  NEURO: A&O x3.  CN II-XII intact.  PSYCH: normal mood. Good eye contact     Assessment & Plan:  Elevated blood pressure reading  Chronic midline low back pain without  sciatica -     Ambulatory referral to Physical Therapy  Assessment and Plan Assessment & Plan Lower Back Pain   Persistent lower back pain is localized to the area of previous epidural and spinal procedures, limiting physical activity, particularly bending, twisting, and lifting. There is no radiation to the legs. Refer to physical therapy for back pain management and schedule local sessions.  Elevated blood pressure  Blood pressure has been consistently in the 120s over the past two weeks, previously fluctuating between 110 and 130s. She is not on antihypertensive medication but requires ongoing monitoring due to previous fluctuations. Continue regular blood pressure monitoring and report any changes.  Polycystic Ovary Syndrome (PCOS)  possibly  PCOS management includes hormonal regulation with Nexplanon  and Nextstellis. Nexplanon  initially caused consistent bleeding, leading to the addition of Nextstellis. She is currently on a three-month trial to assess effectiveness in managing PCOS symptoms. Continue current hormonal therapy and evaluate effectiveness after three months.  General Health Maintenance   She is engaged in weight loss efforts and healthy eating habits, experiencing discomfort when eating out, indicating positive dietary changes. Walking is her form of exercise, though limited by back pain. Continue healthy eating habits, maintain regular walking, and monitor weight loss progress.    Return in about 3 months (around 06/04/2024) for chronic follow-up.  Ellsworth Haas, MD

## 2024-03-04 NOTE — Patient Instructions (Signed)
It was very nice to see you today! ° °Monitor blood pressures. ° ° °PLEASE NOTE: ° °If you had any lab tests please let us know if you have not heard back within a few days. You may see your results on MyChart before we have a chance to review them but we will give you a call once they are reviewed by us. If we ordered any referrals today, please let us know if you have not heard from their office within the next week.  ° °Please try these tips to maintain a healthy lifestyle: ° °Eat most of your calories during the day when you are active. Eliminate processed foods including packaged sweets (pies, cakes, cookies), reduce intake of potatoes, white bread, white pasta, and white rice. Look for whole grain options, oat flour or almond flour. ° °Each meal should contain half fruits/vegetables, one quarter protein, and one quarter carbs (no bigger than a computer mouse). ° °Cut down on sweet beverages. This includes juice, soda, and sweet tea. Also watch fruit intake, though this is a healthier sweet option, it still contains natural sugar! Limit to 3 servings daily. ° °Drink at least 1 glass of water with each meal and aim for at least 8 glasses per day ° °Exercise at least 150 minutes every week.   °

## 2024-04-21 ENCOUNTER — Encounter (HOSPITAL_BASED_OUTPATIENT_CLINIC_OR_DEPARTMENT_OTHER): Payer: Self-pay | Admitting: Emergency Medicine

## 2024-04-21 ENCOUNTER — Emergency Department (HOSPITAL_BASED_OUTPATIENT_CLINIC_OR_DEPARTMENT_OTHER)

## 2024-04-21 ENCOUNTER — Emergency Department (HOSPITAL_BASED_OUTPATIENT_CLINIC_OR_DEPARTMENT_OTHER)
Admission: EM | Admit: 2024-04-21 | Discharge: 2024-04-21 | Disposition: A | Discharge status: Home / Self Care | Attending: Emergency Medicine | Admitting: Emergency Medicine

## 2024-04-21 ENCOUNTER — Other Ambulatory Visit: Payer: Self-pay

## 2024-04-21 DIAGNOSIS — R000 Tachycardia, unspecified: Secondary | ICD-10-CM | POA: Insufficient documentation

## 2024-04-21 DIAGNOSIS — R0789 Other chest pain: Secondary | ICD-10-CM | POA: Diagnosis not present

## 2024-04-21 DIAGNOSIS — R072 Precordial pain: Secondary | ICD-10-CM | POA: Diagnosis not present

## 2024-04-21 DIAGNOSIS — R079 Chest pain, unspecified: Secondary | ICD-10-CM

## 2024-04-21 LAB — D-DIMER, QUANTITATIVE: D-Dimer, Quant: <0.27 ug{FEU}/mL (ref 0.00–0.50)

## 2024-04-21 LAB — CBC WITH DIFFERENTIAL/PLATELET
Abs Immature Granulocytes: 0.01 K/uL (ref 0.00–0.07)
Basophils Absolute: 0 K/uL (ref 0.0–0.1)
Basophils Relative: 0 %
Eosinophils Absolute: 0.1 K/uL (ref 0.0–0.5)
Eosinophils Relative: 1 %
HCT: 35.6 % — ABNORMAL LOW (ref 36.0–46.0)
Hemoglobin: 11 g/dL — ABNORMAL LOW (ref 12.0–15.0)
Immature Granulocytes: 0 %
Lymphocytes Relative: 39 %
Lymphs Abs: 2.9 K/uL (ref 0.7–4.0)
MCH: 23.4 pg — ABNORMAL LOW (ref 26.0–34.0)
MCHC: 30.9 g/dL (ref 30.0–36.0)
MCV: 75.6 fL — ABNORMAL LOW (ref 80.0–100.0)
Monocytes Absolute: 0.4 K/uL (ref 0.1–1.0)
Monocytes Relative: 5 %
Neutro Abs: 4.1 K/uL (ref 1.7–7.7)
Neutrophils Relative %: 55 %
Platelets: 213 K/uL (ref 150–400)
RBC: 4.71 MIL/uL (ref 3.87–5.11)
RDW: 17.7 % — ABNORMAL HIGH (ref 11.5–15.5)
WBC: 7.5 K/uL (ref 4.0–10.5)
nRBC: 0 % (ref 0.0–0.2)

## 2024-04-21 LAB — TROPONIN T, HIGH SENSITIVITY
Troponin T High Sensitivity: <15 ng/L (ref <19)
Troponin T High Sensitivity: <15 ng/L (ref <19)

## 2024-04-21 LAB — BASIC METABOLIC PANEL WITH GFR
Anion gap: 11 (ref 5–15)
BUN: 15 mg/dL (ref 6–20)
CO2: 24 mmol/L (ref 22–32)
Calcium: 9.5 mg/dL (ref 8.9–10.3)
Chloride: 105 mmol/L (ref 98–111)
Creatinine, Ser: 0.91 mg/dL (ref 0.44–1.00)
GFR, Estimated: >60 mL/min (ref >60)
Glucose, Bld: 129 mg/dL — ABNORMAL HIGH (ref 70–99)
Potassium: 3.9 mmol/L (ref 3.5–5.1)
Sodium: 139 mmol/L (ref 135–145)

## 2024-04-21 LAB — HCG, SERUM, QUALITATIVE: Preg, Serum: NEGATIVE

## 2024-04-21 MED ORDER — NAPROXEN 500 MG PO TABS: 500.0000 mg | ORAL_TABLET | Freq: Two times a day (BID) | ORAL | 0 refills | Status: DC

## 2024-04-21 NOTE — Discharge Instructions (Signed)
 You were seen today for chest pain.  Low suspicion this is caused from any emergent cause as your physical exam, labs, imaging were all really reassuring at this time.  Recommend you continue to monitor your symptoms at home, with how high your heart rate was today, recommend you continue to follow-up with your primary care for further monitoring as you may benefit from a cardiology referral.  Otherwise suspect this is likely a inflammation of the cartilage around the ribs combined with possible anxiety as cause for the fast heart rate today.  I am prescribing anti-inflammatory for you to use as needed for the pain.  Monitor symptoms and return to the ED for any new or worsening symptoms which would include chest pain that is worsening and uncontrolled, shortness of breath, fever, dizziness.

## 2024-04-21 NOTE — ED Triage Notes (Signed)
 Chest pain Mid sternal Off and on  Head pressure ( Reports normally has HTN when she feels like this, home BP 140/83)   Started today  During triage reported feeling weird heart rate 160s

## 2024-04-21 NOTE — ED Provider Notes (Signed)
 [place] EMERGENCY DEPARTMENT AT [facility] Provider Note   CSN: [id number] Arrival date & time: 04/21/24  1831     Patient presents with: Chest Pain   Jessica Hendrix is a 23 y.o. female.   Chest Pain Patient is a 23 year old female to the ED today for concerns for central sternal chest pain and shortness of breath that began acutely today, noting that she has felt similarly when she has had her blood pressure elevated.  Reports recently being placed on nextstellis for 3-week trial for breakthrough bleeding on Nexplanon .  Also notes that she has had a recent long trip to [place]  having gone there back 2 times in the last 3 weeks.  Last trip being this past Friday.  Notes that she is feeling significantly better at this time with chest pain coming and going.  Denies headache, vision changes,  hemoptysis, cough, abdominal pain, nausea, vomiting, diarrhea, dysuria, hematochezia, melena, lower leg swelling.     Prior to Admission medications   Medication Sig Start Date End Date Taking? Authorizing Provider  naproxen  (NAPROSYN ) 500 MG tablet Take 1 tablet (500 mg total) by mouth 2 (two) times daily. 04/21/24  Yes Beola Terrall RAMAN, PA-C  Blood Pressure Monitoring (BLOOD PRESSURE KIT) DEVI 1 Device by Does not apply route once a week. 03/13/23   Constant, Peggy, MD  Drospirenone-Estetrol (NEXTSTELLIS) 3-14.2 MG TABS Take by mouth.    [provider]  Prenatal Vit-Fe Fumarate-FA (PRENATAL VITAMIN PO) Take 1 tablet by mouth daily.    [provider]  senna-docusate (SENOKOT-S) 8.6-50 MG tablet Take 2 tablets by mouth daily. 09/18/23   Warren-Hill, Camie LABOR, CNM    Allergies: Cinnamon, Penicillins, and Citrus    Review of Systems  Cardiovascular:  Positive for chest pain.  All other systems reviewed and are negative.   Updated Vital Signs BP 103/70   Pulse 79   Temp 98.8 F (37.1 C) (Oral)   Resp 18   SpO2 100%   Physical Exam Vitals and nursing  note reviewed.  Constitutional:      General: She is not in acute distress.    Appearance: Normal appearance. She is not ill-appearing or diaphoretic.  HENT:     Head: Normocephalic and atraumatic.  Eyes:     Extraocular Movements: Extraocular movements intact.     Conjunctiva/sclera: Conjunctivae normal.  Neck:     Vascular: No JVD.  Cardiovascular:     Rate and Rhythm: Normal rate and regular rhythm.     Pulses: Normal pulses.     Heart sounds: Normal heart sounds. Heart sounds not distant. No murmur heard.    No friction rub. No gallop.  Pulmonary:     Effort: Pulmonary effort is normal. No accessory muscle usage or respiratory distress.     Breath sounds: Normal breath sounds. No stridor. No decreased breath sounds, wheezing, rhonchi or rales.  Chest:     Chest wall: Tenderness (cental sternal chest tenderness) present.  Abdominal:     General: Abdomen is flat.     Palpations: Abdomen is soft.     Tenderness: There is no abdominal tenderness. There is no guarding or rebound.  Musculoskeletal:     Right lower leg: No tenderness. No edema.     Left lower leg: No tenderness. No edema.  Skin:    General: Skin is warm and dry.     Findings: No ecchymosis, erythema or rash.     Nails: There is no clubbing.  Neurological:  General: No focal deficit present.     Mental Status: She is alert and oriented to person, place, and time. Mental status is at baseline.     Cranial Nerves: No cranial nerve deficit.     Sensory: No sensory deficit.     Motor: No weakness.     Gait: Gait normal.  Psychiatric:        Mood and Affect: Mood normal. Mood is not anxious.     (all labs ordered are listed, but only abnormal results are displayed) Labs Reviewed  CBC WITH DIFFERENTIAL/PLATELET - Abnormal; Notable for the following components:      Result Value   Hemoglobin 11.0 (*)    HCT 35.6 (*)    MCV 75.6 (*)    MCH 23.4 (*)    RDW 17.7 (*)    All other components within normal  limits  BASIC METABOLIC PANEL WITH GFR - Abnormal; Notable for the following components:   Glucose, Bld 129 (*)    All other components within normal limits  HCG, SERUM, QUALITATIVE  D-DIMER, QUANTITATIVE  TROPONIN T, HIGH SENSITIVITY  TROPONIN T, HIGH SENSITIVITY    EKG: EKG Interpretation Date/Time:  Sunday April 21 2024 18:43:51 EDT Ventricular Rate:  110 PR Interval:  147 QRS Duration:  80 QT Interval:  314 QTC Calculation: 425 R Axis:   36  Text Interpretation: Sinus tachycardia Low voltage, precordial leads No significant change since last tracing Confirmed by Zackowski, Scott [id number]) on 04/21/2024 7:24:07 PM  Radiology: [facility] Chest Port 1 View Result Date: 04/21/2024 CLINICAL DATA:  Chest pain EXAM: PORTABLE CHEST 1 VIEW COMPARISON:  05/25/2023 FINDINGS: The heart size and mediastinal contours are within normal limits. Both lungs are clear. The visualized skeletal structures are unremarkable. No pneumothorax. IMPRESSION: No active disease. Electronically Signed   By: Franky Crease M.D.   On: 04/21/2024 19:56    Procedures   Medications Ordered in the ED - No data to display          HEART Score: 0                    Medical Decision Making Amount and/or Complexity of Data Reviewed Labs: ordered. Radiology: ordered.  Risk Prescription drug management.   This patient is a 23 year old female who presents to the ED for concern of tachycardia and some central sternal chest pain that began suddenly today accompanied with some shortness of breath that had felt similarly when her blood pressure was elevated when she was pregnant.  Recently started on new birth control 2 months ago and has been finishing course.  Also has Nexplanon  in place.  Noted to have been tachycardic in triage  On physical exam, patient is in no acute distress, afebrile, alert and orient x 4, speaking in full sentences, nontachypneic, nontachycardic.  Notably does not have any abdominal pain to palpation,  LCTAB, RRR, no murmur.  Did have some chest pain to palpation along the central sternal chest wall.  Exam was otherwise unremarkable.  Patient symptoms have significantly improved since arriving to the ED.  Labs were at baseline.  With imaging also reassuring.  Low suspicion for PE, ACS, AAS, arrhythmia, or any other emergent condition at this time.  Patient is at baseline.  Will have continued follow-up with PCP for further evaluation.  Otherwise we will provide some anti-inflammatories for the central sternal chest wall tenderness that she had on exam.  Patient vital signs have remained stable throughout the course of  patient's time in the ED. Low suspicion for any other emergent pathology at this time. I believe this patient is safe to be discharged. Provided strict return to ER precautions. Patient expressed agreement and understanding of plan. All questions were answered.    Differential diagnoses prior to evaluation: The emergent differential diagnosis includes, but is not limited to, ACS, AAS, Pulmonary Embolism, Tension Pneumothorax, Esophageal Rupture, Cardiac Tamponade, Pericarditis, Myocarditis, Pneumothorax, Pneumonia, Aortic Stenosis, CHF Exacerbation, GERD,  Esophageal Spasm,  Mallory-Weiss, Costochondritis, Musculoskeletal Chest Wall Pain, Anxiety / Panic Attack. This is not an exhaustive differential.   Past Medical History / Co-morbidities / Social History: Asthma, allergy,  Additional history: Chart reviewed. Pertinent results include:   Seen by PCP on 03/04/2024.  02/02/2024 recently placed on nextstellis for 3 month trial for breakthrough bleeding on Nexplanon .    Lab Tests/Imaging studies: I personally interpreted labs/imaging and the pertinent results include:    CBC shows baseline anemia BMP unremarkable ECG qualitative negative D-dimer negative Troponin unremarkable and delta also unremarkable Chest x-ray unremarkable    I agree with the radiologist  interpretation.  Cardiac monitoring: EKG obtained and interpreted by myself and attending physician which shows:sinus tachycardia  EKG Interpretation Date/Time:  Sunday April 21 2024 18:43:51 EDT Ventricular Rate:  110 PR Interval:  147 QRS Duration:  80 QT Interval:  314 QTC Calculation: 425 R Axis:   36  Text Interpretation: Sinus tachycardia Low voltage, precordial leads No significant change since last tracing Confirmed by Zackowski, Scott ([id number]) on 04/21/2024 7:24:07 PM          Medications: I ordered medication including naproxen.  I have reviewed the patients home medicines and have made adjustments as needed.  Critical Interventions: None  Social Determinants of Health: Has good patient follow-up with PCP  Disposition: After consideration of the diagnostic results and the patients response to treatment, I feel that the patient would benefit from discharge and follow-up as above.   emergency department workup does not suggest an emergent condition requiring admission or immediate intervention beyond what has been performed at this time. The plan is: Discharge, naproxen for some central sternal chest wall tenderness likely secondary to possible costochondritis, follow-up with PCP for any persistent symptoms, return to the ED for any new or worsening symptoms. The patient is safe for discharge and has been instructed to return immediately for worsening symptoms, change in symptoms or any other concerns.   Final diagnoses:  Chest pain, unspecified type    ED Discharge Orders          Ordered    naproxen (NAPROSYN) 500 MG tablet  2 times daily        07 /27/25 2220               Beola Terrall RAMAN, PA-C 04/21/24 2228    Geraldene Hamilton, MD 04/30/24 1038

## 2024-05-07 ENCOUNTER — Encounter: Payer: Self-pay | Admitting: Advanced Practice Midwife

## 2024-05-07 ENCOUNTER — Ambulatory Visit: Admitting: Advanced Practice Midwife

## 2024-05-07 VITALS — BP 116/69 | HR 77 | Ht 66.0 in | Wt 230.6 lb

## 2024-05-07 DIAGNOSIS — Z30015 Encounter for initial prescription of vaginal ring hormonal contraceptive: Secondary | ICD-10-CM | POA: Diagnosis not present

## 2024-05-07 DIAGNOSIS — Z3046 Encounter for surveillance of implantable subdermal contraceptive: Secondary | ICD-10-CM | POA: Diagnosis not present

## 2024-05-07 DIAGNOSIS — Z3009 Encounter for other general counseling and advice on contraception: Secondary | ICD-10-CM | POA: Diagnosis not present

## 2024-05-07 DIAGNOSIS — N921 Excessive and frequent menstruation with irregular cycle: Secondary | ICD-10-CM | POA: Diagnosis not present

## 2024-05-07 DIAGNOSIS — Z3049 Encounter for surveillance of other contraceptives: Secondary | ICD-10-CM | POA: Insufficient documentation

## 2024-05-07 MED ORDER — ETONOGESTREL-ETHINYL ESTRADIOL 0.12-0.015 MG/24HR VA RING: VAGINAL_RING | VAGINAL | 12 refills | Status: DC

## 2024-05-07 MED ORDER — ETONOGESTREL-ETHINYL ESTRADIOL 0.12-0.015 MG/24HR VA RING
VAGINAL_RING | VAGINAL | 4 refills | Status: DC
  Filled 2024-05-29: qty 3, 84d supply, fill #0
  Filled 2024-08-21 – 2024-09-02 (×2): qty 3, 84d supply, fill #1

## 2024-05-07 NOTE — Progress Notes (Addendum)
   GYNECOLOGY PROGRESS NOTE  History:  23 y.o. G1P0101 presents to [facility] office today for problem gyn visit. She reports continued bleeding w/ nexplanon  even with a 3 month trial of Nexstelis. She also reports having some elevated BP readings since her previous visit. Patient desires to have Nexplanon  removed today and discuss other options. She denies h/a, dizziness, shortness of breath, n/v, or fever/chills.    The following portions of the patient's history were reviewed and updated as appropriate: allergies, current medications, past family history, past medical history, past social history, past surgical history and problem list. Last pap smear on 01/30/24 was normal.   Health Maintenance Due  Topic Date Due   Hepatitis B Vaccines (1 of 3 - 19+ 3-dose series) Never done   COVID-19 Vaccine (3 - 2024-25 season) 05/28/2023   INFLUENZA VACCINE  04/26/2024     Review of Systems:  Pertinent items are noted in HPI.   Objective:  Physical Exam Blood pressure 116/69, pulse 77, height 5' 6 (1.676 m), weight 104.6 kg, not currently breastfeeding. VS reviewed, nursing note reviewed,  Constitutional: well developed, well nourished, no distress HEENT: normocephalic CV: normal rate Pulm/chest wall: normal effort Breast Exam: deferred Abdomen: soft Neuro: alert and oriented x 3 Skin: warm, dry Psych: affect normal Pelvic exam: Deferred Bimanual exam: Deferred   Nexplanon  Removal Patient identified, informed consent performed, consent signed.   Appropriate time out taken. Nexplanon  site identified.  Area prepped in usual sterile fashon. One ml of 1% lidocaine  was used to anesthetize the area at the distal end of the implant. A small stab incision was made right beside the implant on the distal portion.  The Nexplanon  rod was grasped using hemostats and removed without difficulty.  There was minimal blood loss. There were no complications.  3 ml of 1% lidocaine  was injected around the  incision for post-procedure analgesia.  Steri-strips were applied over the small incision.  A pressure bandage was applied to reduce any bruising.  The patient tolerated the procedure well and was given post procedure instructions.  Patient is planning to use Nuva Ring for contraception/attempt conception.       Assessment & Plan:  1. Breakthrough bleeding on Nexplanon  (Primary) Desires Nexplanon  removal due to abnormal bleeding  2. Nexplanon  removal Nexplanon  removed without difficulty  3. Encounter for counseling regarding contraception Discussed pt contraceptive plans and reviewed contraceptive methods based on pt preferences and effectiveness.  Pt prefers Nuva Ring prescription   4. Encounter for initial prescription of vaginal ring hormonal contraceptive Follow up in 4 weeks for Nurse Visit BP check  - etonogestrel -ethinyl estradiol  (NUVARING) 0.12-0.015 MG/24HR vaginal ring; Insert vaginally and leave in place for 3 consecutive weeks, then remove for 1 week.  Dispense: 3 each; Refill: 4   Return in about 4 weeks (around 06/04/2024) for Nurse Visit BP Check.   Derrek JINNY Freund, NP Student 5:25 PM   Midwife Attestation:  I personally saw and evaluated the patient, performing the key elements of the service. I developed and verified the management plan that is described in the resident's/student's note, and I agree with the content with my edits above. VSS, HRR&R, Resp unlabored, Legs neg.    Olam Boards, CNM 5:51 PM

## 2024-05-07 NOTE — Progress Notes (Signed)
 Pt presents for Jessica Hendrix f/u. Nexplanon  placed 10/2023. Pt requesting removal due to side effects. Want new BC, unsure what method

## 2024-05-22 ENCOUNTER — Ambulatory Visit
Admission: EM | Admit: 2024-05-22 | Discharge: 2024-05-22 | Disposition: A | Discharge status: Home / Self Care | Attending: Family Medicine | Admitting: Family Medicine

## 2024-05-22 ENCOUNTER — Other Ambulatory Visit: Payer: Self-pay

## 2024-05-22 DIAGNOSIS — Z8709 Personal history of other diseases of the respiratory system: Secondary | ICD-10-CM | POA: Insufficient documentation

## 2024-05-22 DIAGNOSIS — Z793 Long term (current) use of hormonal contraceptives: Secondary | ICD-10-CM | POA: Diagnosis not present

## 2024-05-22 DIAGNOSIS — S70361A Insect bite (nonvenomous), right thigh, initial encounter: Secondary | ICD-10-CM | POA: Diagnosis not present

## 2024-05-22 DIAGNOSIS — Z3202 Encounter for pregnancy test, result negative: Secondary | ICD-10-CM | POA: Diagnosis not present

## 2024-05-22 DIAGNOSIS — R300 Dysuria: Secondary | ICD-10-CM

## 2024-05-22 DIAGNOSIS — Z3200 Encounter for pregnancy test, result unknown: Secondary | ICD-10-CM

## 2024-05-22 DIAGNOSIS — R112 Nausea with vomiting, unspecified: Secondary | ICD-10-CM | POA: Diagnosis not present

## 2024-05-22 DIAGNOSIS — M549 Dorsalgia, unspecified: Secondary | ICD-10-CM | POA: Diagnosis not present

## 2024-05-22 DIAGNOSIS — W57XXXA Bitten or stung by nonvenomous insect and other nonvenomous arthropods, initial encounter: Secondary | ICD-10-CM

## 2024-05-22 DIAGNOSIS — Z8659 Personal history of other mental and behavioral disorders: Secondary | ICD-10-CM | POA: Diagnosis not present

## 2024-05-22 LAB — POCT URINE DIPSTICK
Bilirubin, UA: NEGATIVE
Blood, UA: NEGATIVE
Glucose, UA: NEGATIVE mg/dL
Ketones, POC UA: NEGATIVE mg/dL
Leukocytes, UA: NEGATIVE
Nitrite, UA: NEGATIVE
POC PROTEIN,UA: 30 — AB
Spec Grav, UA: >=1.03 — AB (ref 1.010–1.025)
Urobilinogen, UA: 0.2 U/dL
pH, UA: 6 (ref 5.0–8.0)

## 2024-05-22 LAB — POCT URINE PREGNANCY: Preg Test, Ur: NEGATIVE

## 2024-05-22 MED ORDER — ONDANSETRON 4 MG PO TBDP
4.0000 mg | ORAL_TABLET | Freq: Three times a day (TID) | ORAL | 0 refills | Status: DC | PRN
Start: 2024-05-22 — End: 2024-07-15

## 2024-05-22 MED ORDER — TRIAMCINOLONE ACETONIDE 0.1 % EX CREA: 1.0000 | TOPICAL_CREAM | Freq: Two times a day (BID) | CUTANEOUS | 0 refills | Status: DC

## 2024-05-22 NOTE — Discharge Instructions (Addendum)
 The clinic will contact you with the results of the urine culture and blood pregnancy test done today if positive.  May take Zofran  as needed for your nausea or vomiting.  Bland diet and focus on hydration and electrolyte replacement such as Gatorade, Powerade, Pedialyte, water .  You may use Kenalog  topical steroid cream to the bug bite as needed for itching.  Lots of rest and fluids.  Please follow-up with your PCP if your symptoms or not improving.  Please go to the ER for any worsening symptoms.  Hope you feel better soon!

## 2024-05-22 NOTE — ED Triage Notes (Signed)
 Pt c/o N/Vx1.5wks. Pt states took 3 pregnancy tests 2 were negative and one was positive with a faint line. Pt c/o pain in mid back, feeling sensation of having to urinate, but can'tx2d. Pt has a insect bite on right upper leg. It's a red raised bump approx the size of a nickel that is stinging and itching.

## 2024-05-22 NOTE — ED Provider Notes (Signed)
 [facility]    CSN: [id number] Arrival date & time: 05/22/24  1736      History   Chief Complaint No chief complaint on file.   HPI Jessica Hendrix is a 23 y.o. female with past medical history of anxiety, asthma, depression presents for nausea vomiting, dysuria and a bug bite.  Patient reports 1/2-week of intermittent nausea vomiting equating to maybe 2 or 3 episodes.  Denies any diarrhea, URI symptoms, abdominal pain, fevers or chills.  She has had 2 to 3 days of urinary burning, urgency, frequency.  Also reports some right back pain.  No vaginal discharge or STD concern.  Has a Nexplanon  but reports 2 negative home pregnancy test as well as 1 with a faint positive line.  In addition she reports a bug bite to her right thigh for couple days that is pruritic and burning.  She is not sure what bit her.  No drainage swelling or warmth.  She has not used any OTC treatments for the symptoms.  No other concerns at this time.  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/05/2019   GAD (generalized anxiety disorder) 03/08/2020   Gestational thrombocytopenia, third trimester (HCC) 07/20/2023   Postpartum hypertension 09/19/2023   Pyelonephritis    Twitch 12/05/2019   UTI (urinary tract infection)     Patient Active Problem List   Diagnosis Date Noted   Encounter for initial management of nuvaring 05/07/2024   Moderate persistent asthma without complication 01/13/2023    Past Surgical History:  Procedure Laterality Date   CESAREAN SECTION N/A 09/15/2023   Procedure: CESAREAN SECTION;  Surgeon: Erik Kieth BROCKS, MD;  Location: [facility] LD ORS;  Service: Obstetrics;  Laterality: N/A;   WISDOM TOOTH EXTRACTION      OB History     Gravida  1   Para  1   Term  0   Preterm  1   AB  0   Living  1      SAB  0   IAB  0   Ectopic  0    Multiple  0   Live Births  1            Home Medications    Prior to Admission medications   Medication Sig Start Date End Date Taking? Authorizing Provider  ondansetron  (ZOFRAN -ODT) 4 MG disintegrating tablet Take 1 tablet (4 mg total) by mouth every 8 (eight) hours as needed for nausea or vomiting. 05/22/24  Yes Jerrica Thorman, Jodi R, NP  triamcinolone  cream (KENALOG ) 0.1 % Apply 1 Application topically 2 (two) times daily. 05/22/24  Yes Jahzeel Poythress, Jodi R, NP  etonogestrel -ethinyl estradiol  (NUVARING) 0.12-0.015 MG/24HR vaginal ring Insert vaginally and leave in place for 3 consecutive weeks, then remove for 1 week. 05/07/24   Leftwich-Kirby, Olam LABOR, CNM    Family History Family History  Problem Relation Age of Onset   Asthma Mother    Sleep apnea Mother    Obstructive Sleep Apnea Mother    Diabetes Father    Allergic rhinitis Neg Hx    Angioedema Neg Hx    Atopy Neg Hx    Eczema Neg Hx    Immunodeficiency Neg Hx    Urticaria Neg Hx     Social History Social History   Tobacco Use  Smoking status: Never   Smokeless tobacco: Never  Vaping Use   Vaping status: Never Used  Substance Use Topics   Alcohol use: Yes    Comment: occ   Drug use: Not Currently    Types: Marijuana    Comment: last use feb 2024     Allergies   Cinnamon, Penicillins, and Citrus   Review of Systems Review of Systems  Gastrointestinal:  Positive for nausea and vomiting.  Genitourinary:  Positive for dysuria.  Skin:        Bug bite     Physical Exam Triage Vital Signs ED Triage Vitals  Encounter Vitals Group     BP 05/22/24 1756 102/71     Girls Systolic BP Percentile --      Girls Diastolic BP Percentile --      Boys Systolic BP Percentile --      Boys Diastolic BP Percentile --      Pulse Rate 05/22/24 1756 76     Resp 05/22/24 1756 16     Temp 05/22/24 1756 98.3 F (36.8 C)     Temp src --      SpO2 05/22/24 1756 98 %     Weight --      Height --      Head Circumference --       Peak Flow --      Pain Score 05/22/24 1754 5     Pain Loc --      Pain Education --      Exclude from Growth Chart --    No data found.  Updated Vital Signs BP 102/71   Pulse 76   Temp 98.3 F (36.8 C)   Resp 16   LMP 04/23/2024   SpO2 98%   Breastfeeding No   Visual Acuity Right Eye Distance:   Left Eye Distance:   Bilateral Distance:    Right Eye Near:   Left Eye Near:    Bilateral Near:     Physical Exam Vitals and nursing note reviewed.  Constitutional:      General: She is not in acute distress.    Appearance: Normal appearance. She is not ill-appearing.  HENT:     Head: Normocephalic and atraumatic.  Eyes:     Pupils: Pupils are equal, round, and reactive to light.  Cardiovascular:     Rate and Rhythm: Normal rate.  Pulmonary:     Effort: Pulmonary effort is normal.  Abdominal:     Tenderness: There is no right CVA tenderness or left CVA tenderness.  Skin:    General: Skin is warm and dry.         Comments: Single insect bite to the right upper thigh.  Mild erythema without warmth, drainage, fluctuance or induration.  Neurological:     General: No focal deficit present.     Mental Status: She is alert and oriented to person, place, and time.  Psychiatric:        Mood and Affect: Mood normal.        Behavior: Behavior normal.      UC Treatments / Results  Labs (all labs ordered are listed, but only abnormal results are displayed) Labs Reviewed  POCT URINE DIPSTICK - Abnormal; Notable for the following components:      Result Value   Spec Grav, UA >=1.030 (*)    POC PROTEIN,UA =30 (*)    All other components within normal limits  URINE CULTURE  BETA HCG QUANT (REF LAB)  POCT URINE PREGNANCY    EKG   Radiology No results found.  Procedures Procedures (including critical care time)  Medications Ordered in UC Medications - No data to display  Initial Impression / Assessment and Plan / UC Course  I have reviewed the triage vital  signs and the nursing notes.  Pertinent labs & imaging results that were available during my care of the patient were reviewed by me and considered in my medical decision making (see chart for details).     Reviewed exam and symptoms with patient.  She is well-appearing and in no acute distress.  UA is negative for UTI, given symptoms we will culture.  Negative urine hCG in clinic but she does report a possible positive quant at home, will  to blood hCG to confirm.  Will do Zofran  as needed for nausea or vomiting.  Discussed bug bite without signs of infection.  Topical Kenalog  cream as needed for itching.  Advised PCP follow-up if symptoms do not improve.  ER precautions reviewed Final Clinical Impressions(s) / UC Diagnoses   Final diagnoses:  Dysuria  Possible pregnancy  Bug bite, initial encounter  Nausea and vomiting, unspecified vomiting type     Discharge Instructions      The clinic will contact you with the results of the urine culture and blood pregnancy test done today if positive.  May take Zofran  as needed for your nausea or vomiting.  Bland diet and focus on hydration and electrolyte replacement such as Gatorade, Powerade, Pedialyte, water .  You may use Kenalog  topical steroid cream to the bug bite as needed for itching.  Lots of rest and fluids.  Please follow-up with your PCP if your symptoms or not improving.  Please go to the ER for any worsening symptoms.  Hope you feel better soon!    ED Prescriptions     Medication Sig Dispense Auth. Provider   ondansetron  (ZOFRAN -ODT) 4 MG disintegrating tablet Take 1 tablet (4 mg total) by mouth every 8 (eight) hours as needed for nausea or vomiting. 8 tablet Camilla Skeen, Jodi R, NP   triamcinolone  cream (KENALOG ) 0.1 % Apply 1 Application topically 2 (two) times daily. 30 g Mikal Wisman, Jodi R, NP      PDMP not reviewed this encounter.   Loreda Myla SAUNDERS, NP 05/22/24 [id number]

## 2024-05-23 LAB — URINE CULTURE: Culture: <10000 — AB

## 2024-05-24 ENCOUNTER — Ambulatory Visit (HOSPITAL_COMMUNITY): Payer: Self-pay

## 2024-05-24 LAB — BETA HCG QUANT (REF LAB): hCG Quant: <1 m[IU]/mL

## 2024-05-29 ENCOUNTER — Other Ambulatory Visit (HOSPITAL_BASED_OUTPATIENT_CLINIC_OR_DEPARTMENT_OTHER): Payer: Self-pay

## 2024-05-29 MED ORDER — ETONOGESTREL-ETHINYL ESTRADIOL 0.12-0.015 MG/24HR VA RING: VAGINAL_RING | VAGINAL | 12 refills | Status: DC

## 2024-06-04 ENCOUNTER — Ambulatory Visit: Admitting: Family Medicine

## 2024-06-05 ENCOUNTER — Ambulatory Visit (INDEPENDENT_AMBULATORY_CARE_PROVIDER_SITE_OTHER)

## 2024-06-05 VITALS — BP 111/68 | HR 76 | Wt 228.0 lb

## 2024-06-05 DIAGNOSIS — Z0130 Encounter for examination of blood pressure without abnormal findings: Secondary | ICD-10-CM | POA: Diagnosis not present

## 2024-06-05 NOTE — Progress Notes (Signed)
..  Subjective:  Jessica Hendrix is a 23 y.o. female here for BP check. Pt notified provider at the last visit on 05/07/24 that she had some elevated BPs at home, so she is following up today. Pt denies any abnormal symptoms.  Hypertension ROS: no TIA's, no chest pain on exertion, no dyspnea on exertion, and no swelling of ankles.    Objective:  BP 111/68   Pulse 76   Wt 228 lb (103.4 kg)   LMP 04/23/2024   BMI 36.80 kg/m   Appearance alert, well appearing, and in no distress. General exam BP noted to be well controlled today in office.    Assessment:   Blood Pressure well controlled.   Plan:  Current treatment plan is effective, no change in therapy.. Advised to continue to monitor for abnormal symptoms and BP at home, follow up in office or at hospital should they occur, pt agreed.

## 2024-06-10 ENCOUNTER — Telehealth: Payer: Self-pay

## 2024-06-10 NOTE — Telephone Encounter (Signed)
 Pt is currently using Albuterol  inhaler. Medication is not on medication list.  Please advise if ok to send RX.   Copied from CRM #[id number]. Topic: Clinical - Medical Advice >> Jun 10, 2024  8:52 AM Jessica Hendrix wrote: Reason for CRM: Pt says she has been having chest tightnes, she has been wheezing and SOB when walking. She also mentions coughing up mucus. Pt says she does not need to speak with a nurse and does not feel like coming in but just want an inhaler to be sent to the pharmacy for her. Please send to [facility] [id number] [place], [place] - [address] [place] [address] Phone: [phone] Fax: [phone]   Pt is available via My Chart.

## 2024-06-11 ENCOUNTER — Telehealth: Admitting: Physician Assistant

## 2024-06-11 DIAGNOSIS — J302 Other seasonal allergic rhinitis: Secondary | ICD-10-CM

## 2024-06-11 DIAGNOSIS — Z760 Encounter for issue of repeat prescription: Secondary | ICD-10-CM | POA: Diagnosis not present

## 2024-06-11 MED ORDER — ALBUTEROL SULFATE HFA 108 (90 BASE) MCG/ACT IN AERS
2.0000 | INHALATION_SPRAY | Freq: Four times a day (QID) | RESPIRATORY_TRACT | 0 refills | Status: DC | PRN
  Filled 2024-06-11 – 2024-06-12 (×2): qty 6.7, 17d supply, fill #0

## 2024-06-11 MED ORDER — FLUTICASONE PROPIONATE 50 MCG/ACT NA SUSP
2.0000 | Freq: Every day | NASAL | 0 refills | Status: DC
  Filled 2024-06-11 – 2024-06-12 (×2): qty 16, 30d supply, fill #0

## 2024-06-11 NOTE — Patient Instructions (Signed)
  Jessica Hendrix, thank you for joining Jessica Velma Lunger, PA-C for today's virtual visit.  While this provider is not your primary care provider (PCP), if your PCP is located in our provider database this encounter information will be shared with them immediately following your visit.   A [facility] MyChart account gives you access to today's visit and all your visits, tests, and labs performed at [facility]  click here if you don't have a [facility] MyChart account or go to [email or web address]  Consent: (Patient) Jessica Hendrix provided verbal consent for this virtual visit at the beginning of the encounter.  Current Medications:  Current Outpatient Medications:    etonogestrel -ethinyl estradiol  (NUVARING) 0.12-0.015 MG/24HR vaginal ring, Insert vaginally and leave in place for 3 consecutive weeks, then remove for 1 week., Disp: 3 each, Rfl: 4   ondansetron  (ZOFRAN -ODT) 4 MG disintegrating tablet, Take 1 tablet (4 mg total) by mouth every 8 (eight) hours as needed for nausea or vomiting., Disp: 8 tablet, Rfl: 0   Medications ordered in this encounter:  No orders of the defined types were placed in this encounter.    *If you need refills on other medications prior to your next appointment, please contact your pharmacy*  Follow-Up: Call back or seek an in-person evaluation if the symptoms worsen or if the condition fails to improve as anticipated.  [facility] [phone]  Other Instructions Please hydrate and rest. Start a saline nasal rinse daily. Start the Flonase  once daily. Take Claritin OTC. I have sent in a refill of your albuterol  inhaler to have on hand.  If you note any non-resolving, new, or worsening symptoms despite treatment, please seek an in-person evaluation ASAP.     If you have been instructed to have an in-person evaluation today at a local Urgent Care facility, please use the link below. It will take you to a list of  all of our available [facility], including address, phone number and hours of operation. Please do not delay care.  [facility]  If you or a family member do not have a primary care provider, use the link below to schedule a visit and establish care. When you choose a [facility] primary care physician or advanced practice provider, you gain a long-term partner in health. Find a Primary Care Provider  Learn more about [facility]'s in-office and virtual care options: [facility] - Get Care Now

## 2024-06-11 NOTE — Progress Notes (Signed)
 Virtual Visit Consent   Jessica Hendrix, you are scheduled for a virtual visit with a [facility] provider today. Just as with appointments in the office, your consent must be obtained to participate. Your consent will be active for this visit and any virtual visit you may have with one of our providers in the next 365 days. If you have a MyChart account, a copy of this consent can be sent to you electronically.  As this is a virtual visit, video technology does not allow for your provider to perform a traditional examination. This may limit your provider's ability to fully assess your condition. If your provider identifies any concerns that need to be evaluated in person or the need to arrange testing (such as labs, EKG, etc.), we will make arrangements to do so. Although advances in technology are sophisticated, we cannot ensure that it will always work on either your end or our end. If the connection with a video visit is poor, the visit may have to be switched to a telephone visit. With either a video or telephone visit, we are not always able to ensure that we have a secure connection.  By engaging in this virtual visit, you consent to the provision of healthcare and authorize for your insurance to be billed (if applicable) for the services provided during this visit. Depending on your insurance coverage, you may receive a charge related to this service.  I need to obtain your verbal consent now. Are you willing to proceed with your visit today? Jessica Hendrix has provided verbal consent on 06/11/2024 for a virtual visit (video or telephone). Jessica Hendrix, [place]  Date: 06/11/2024 7:21 PM   Virtual Visit via Video Note   I, Jessica Hendrix, connected with  Jessica Hendrix  ([id number], 11-07-2000) on 06/11/24 at  7:30 PM EDT by a video-enabled telemedicine application and verified that I am speaking with the correct person using two identifiers.  Location: Patient: Virtual Visit  Location Patient: Home Provider: Virtual Visit Location Provider: Home Office   I discussed the limitations of evaluation and management by telemedicine and the availability of in person appointments. The patient expressed understanding and agreed to proceed.    History of Present Illness: Jessica Hendrix is a 23 y.o. who identifies as a female who was assigned female at birth, and is being seen today for a refill of her albuterol  inhaler.   Has history of asthma, previously on a regimen of Symbicort  which she stopped during pregnancy, and albuterol  PRN. Notes been doing well overall but after [place] this [place] with her child noting allergy symptoms of sneezing, rhinorrhea and drainage, along with some mild chest tightness. Noted her albuterol  inhaler was expired.   HPI: HPI  Problems:  Patient Active Problem List   Diagnosis Date Noted   Encounter for initial management of nuvaring 05/07/2024   Moderate persistent asthma without complication 01/13/2023    Allergies:  Allergies  Allergen Reactions   Cinnamon     spices   Penicillins Other (See Comments)    unk   Citrus Rash   Medications:  Current Outpatient Medications:    albuterol  (VENTOLIN  HFA) 108 (90 Base) MCG/ACT inhaler, Inhale 2 puffs into the lungs every 6 (six) hours as needed for wheezing or shortness of breath., Disp: 8 g, Rfl: 0   fluticasone  (FLONASE ) 50 MCG/ACT nasal spray, Place 2 sprays into both nostrils daily., Disp: 16 g, Rfl: 0   etonogestrel -ethinyl estradiol  (  NUVARING) 0.12-0.015 MG/24HR vaginal ring, Insert vaginally and leave in place for 3 consecutive weeks, then remove for 1 week., Disp: 3 each, Rfl: 4   ondansetron  (ZOFRAN -ODT) 4 MG disintegrating tablet, Take 1 tablet (4 mg total) by mouth every 8 (eight) hours as needed for nausea or vomiting., Disp: 8 tablet, Rfl: 0  Observations/Objective: Patient is well-developed, well-nourished in no acute distress.  Resting comfortably at home.  Head  is normocephalic, atraumatic.  No labored breathing. Speech is clear and coherent with logical content.  Patient is alert and oriented at baseline.   Assessment and Plan: 1. Encounter for medication refill (Primary) - albuterol  (VENTOLIN  HFA) 108 (90 Base) MCG/ACT inhaler; Inhale 2 puffs into the lungs every 6 (six) hours as needed for wheezing or shortness of breath.  Dispense: 8 g; Refill: 0  2. Seasonal allergic rhinitis, unspecified trigger - fluticasone  (FLONASE ) 50 MCG/ACT nasal spray; Place 2 sprays into both nostrils daily.  Dispense: 16 g; Refill: 0  Albuterol  refilled. Start OTC Claritin. Will restart Flonase  (no concern for pregnancy and is not breastfeeding). Follow-up with PCP for ongoing asthma management.  Follow Up Instructions: I discussed the assessment and treatment plan with the patient. The patient was provided an opportunity to ask questions and all were answered. The patient agreed with the plan and demonstrated an understanding of the instructions.  A copy of instructions were sent to the patient via MyChart unless otherwise noted below.   The patient was advised to call back or seek an in-person evaluation if the symptoms worsen or if the condition fails to improve as anticipated.    Jessica Velma Lunger, PA-C

## 2024-06-12 ENCOUNTER — Other Ambulatory Visit (HOSPITAL_COMMUNITY): Payer: Self-pay

## 2024-06-12 ENCOUNTER — Other Ambulatory Visit (HOSPITAL_BASED_OUTPATIENT_CLINIC_OR_DEPARTMENT_OTHER): Payer: Self-pay

## 2024-06-13 ENCOUNTER — Other Ambulatory Visit (HOSPITAL_COMMUNITY): Payer: Self-pay

## 2024-07-15 ENCOUNTER — Ambulatory Visit
Admission: RE | Admit: 2024-07-15 | Discharge: 2024-07-15 | Disposition: A | Discharge status: Home / Self Care | Source: Ambulatory Visit | Attending: Family Medicine | Admitting: Family Medicine

## 2024-07-15 VITALS — BP 116/62 | HR 81 | Temp 98.3°F | Resp 16

## 2024-07-15 DIAGNOSIS — R110 Nausea: Secondary | ICD-10-CM

## 2024-07-15 DIAGNOSIS — M5416 Radiculopathy, lumbar region: Secondary | ICD-10-CM | POA: Diagnosis not present

## 2024-07-15 DIAGNOSIS — R112 Nausea with vomiting, unspecified: Secondary | ICD-10-CM | POA: Diagnosis not present

## 2024-07-15 DIAGNOSIS — M5126 Other intervertebral disc displacement, lumbar region: Secondary | ICD-10-CM

## 2024-07-15 MED ORDER — ONDANSETRON 4 MG PO TBDP: 4.0000 mg | ORAL_TABLET | Freq: Three times a day (TID) | ORAL | 0 refills | Status: DC | PRN

## 2024-07-15 MED ORDER — PREDNISONE 20 MG PO TABS: ORAL_TABLET | ORAL | 0 refills | Status: DC

## 2024-07-15 MED ORDER — CYCLOBENZAPRINE HCL 5 MG PO TABS: 5.0000 mg | ORAL_TABLET | Freq: Every evening | ORAL | 0 refills | Status: DC | PRN

## 2024-07-15 NOTE — ED Provider Notes (Signed)
 [facility]  Note:  This document was prepared using Conservation officer, historic buildings and may include unintentional dictation errors.  MRN: [id number] DOB: 2001/05/16  Subjective:   Jessica Hendrix is a 23 y.o. female presenting for acute on chronic low back pain that radiates towards the left lower leg.  Symptoms started since December of last year.  Has had multiple visits for the same, multiple imaging done.  The most significant finding was a disc protrusion at the level of L4-L5.  Patient does not see a back specialist.  Has been using ibuprofen  intermittently without much relief.  The only inciting factor she can think of was having had an epidural for an emergency C-section.  Since then she has had her back issues.  Otherwise no fall, trauma.  No history of musculoskeletal conditions.  No urinary symptoms.  No current facility-administered medications for this encounter.  Current Outpatient Medications:    albuterol  (VENTOLIN  HFA) 108 (90 Base) MCG/ACT inhaler, Inhale 2 puffs into the lungs every 6 (six) hours as needed for wheezing or shortness of breath., Disp: 6.7 g, Rfl: 0   etonogestrel -ethinyl estradiol  (NUVARING) 0.12-0.015 MG/24HR vaginal ring, Insert vaginally and leave in place for 3 consecutive weeks, then remove for 1 week., Disp: 3 each, Rfl: 4   fluticasone  (FLONASE ) 50 MCG/ACT nasal spray, Place 2 sprays into both nostrils daily., Disp: 16 g, Rfl: 0   ondansetron  (ZOFRAN -ODT) 4 MG disintegrating tablet, Take 1 tablet (4 mg total) by mouth every 8 (eight) hours as needed for nausea or vomiting., Disp: 8 tablet, Rfl: 0   Allergies  Allergen Reactions   Cinnamon     spices   Penicillins Other (See Comments)    unk   Citrus Rash    Past Medical History:  Diagnosis Date   Allergy    Anemia    Anxiety    Asthma    Constipation    Depression    Depression, major, single episode, moderate (HCC) 12/05/2019   GAD (generalized anxiety  disorder) 03/08/2020   Gestational thrombocytopenia, third trimester 07/20/2023   Postpartum hypertension 09/19/2023   Pyelonephritis    Twitch 12/05/2019   UTI (urinary tract infection)      Past Surgical History:  Procedure Laterality Date   CESAREAN SECTION N/A 09/15/2023   Procedure: CESAREAN SECTION;  Surgeon: Erik Kieth BROCKS, MD;  Location: [facility] LD ORS;  Service: Obstetrics;  Laterality: N/A;   WISDOM TOOTH EXTRACTION      Family History  Problem Relation Age of Onset   Asthma Mother    Sleep apnea Mother    Obstructive Sleep Apnea Mother    Diabetes Father    Allergic rhinitis Neg Hx    Angioedema Neg Hx    Atopy Neg Hx    Eczema Neg Hx    Immunodeficiency Neg Hx    Urticaria Neg Hx     Social History   Tobacco Use   Smoking status: Never   Smokeless tobacco: Never  Vaping Use   Vaping status: Never Used  Substance Use Topics   Alcohol use: Yes    Comment: occ   Drug use: Not Currently    Types: Marijuana    ROS   Objective:   Vitals: BP 116/62 (BP Location: Right Arm)   Pulse 81   Temp 98.3 F (36.8 C) (Oral)   Resp 16   LMP 07/08/2024   SpO2 96%   Physical Exam Constitutional:      General:  She is not in acute distress.    Appearance: Normal appearance. She is well-developed. She is not ill-appearing, toxic-appearing or diaphoretic.  HENT:     Head: Normocephalic and atraumatic.     Nose: Nose normal.     Mouth/Throat:     Mouth: Mucous membranes are moist.  Eyes:     General: No scleral icterus.       Right eye: No discharge.        Left eye: No discharge.     Extraocular Movements: Extraocular movements intact.  Cardiovascular:     Rate and Rhythm: Normal rate.  Pulmonary:     Effort: Pulmonary effort is normal.  Musculoskeletal:     Lumbar back: Spasms and tenderness (left lower paraspinal muscles of the lumbar region) present. No swelling, edema, deformity, signs of trauma, lacerations or bony tenderness. Normal range of  motion. Negative right straight leg raise test and negative left straight leg raise test. No scoliosis.  Skin:    General: Skin is warm and dry.  Neurological:     General: No focal deficit present.     Mental Status: She is alert and oriented to person, place, and time.  Psychiatric:        Mood and Affect: Mood normal.        Behavior: Behavior normal.     Narrative & Impression  CLINICAL DATA:  23 year old female with positional headaches following epidural anesthesia for C-section, with questionable L3-L4 CSF leak on total spine CT myelogram 3 days ago.   EXAM: MRI LUMBAR SPINE WITHOUT AND WITH CONTRAST   TECHNIQUE: Multiplanar and multiecho pulse sequences of the lumbar spine were obtained without and with intravenous contrast.   CONTRAST:  10mL GADAVIST  GADOBUTROL  1 MMOL/ML IV SOLN   COMPARISON:  Total spine CT myelogram 11/30/2023.   FINDINGS: Segmentation: Designated as normal with hypoplastic ribs at T12 as on the recent comparison.   Alignment:  Stable and essentially normal lumbar lordosis.   Vertebrae: Maintained vertebral height. Visualized bone marrow signal is within normal limits. No marrow edema or evidence of acute osseous abnormality. Visible sacrum and SI joints appear intact.   Conus medullaris and cauda equina: Conus extends to the T12-L1 level. No lower spinal cord or conus signal abnormality. No abnormal intradural enhancement. No dural thickening.   Paraspinal and other soft tissues: Substantially motion degraded precontrast T1 axial imaging. Negative visible abdominal viscera. Minimal L3-L4 level subcutaneous soft tissue heterogeneity posterior to the spine which might be related to the recent lumbar puncture.   The epidural space visible on motion degraded axial T1 weighted imaging seems to remain normal. Likewise, sagittal T1 epidural space appears normal and that sequences of good image quality.   Disc levels:   Indistinct appearance  of the cauda equina nerve roots throughout, although appeared normal on the recent myelogram. But no discrete nerve root thickening or enhancement.   Subtle evidence of trace posterior subdural space fluid dorsal to the conus medullaris (series 5, image 8). No larger rim enhancing fluid collection is evident in the thecal sac.   Normal intervertebral disc signal and morphology except at L4-L5.   L4-L5: Disc desiccation, circumferential disc bulge mildly asymmetric to the left. Small broad-based central disc protrusion. Mild facet hypertrophy. No significant stenosis.   IMPRESSION: 1. Tiny dorsal subdural fluid collection at the thoracolumbar junction and indistinct appearance of the cauda equina nerve roots following recent total spine Myelography, probably are incidental findings. No abnormal thecal sac or nerve root  enhancement. No evidence of an epidural process. No evidence of spinal infection. 2. Stable isolated L4-L5 disc degeneration with no significant stenosis.     Electronically Signed   By: VEAR Hurst M.D.   On: 12/03/2023 05:59      Assessment and Plan :   PDMP not reviewed this encounter.  1. Lumbar radiculopathy   2. Protrusion of lumbar intervertebral disc   3. Nausea and vomiting, unspecified vomiting type   4. Chronic nausea    Recommended oral prednisone  course as she has failed NSAID treatment.  Coupled this with cyclobenzaprine .  Follow-up with a spine specialist, orthopedist.  Counseled patient on potential for adverse effects with medications prescribed/recommended today, ER and return-to-clinic precautions discussed, patient verbalized understanding.    Christopher Savannah, [place] 07/15/24 1616

## 2024-07-15 NOTE — ED Triage Notes (Signed)
 Pt c/o intermittent lower back pain that radiates down LLE since having epidural during labor Dec 2024-taking ibuprofen cherre dose yesterday-NAD-steady gait

## 2024-07-17 ENCOUNTER — Other Ambulatory Visit (HOSPITAL_COMMUNITY): Payer: Self-pay

## 2024-08-05 DIAGNOSIS — K0889 Other specified disorders of teeth and supporting structures: Secondary | ICD-10-CM | POA: Insufficient documentation

## 2024-08-21 ENCOUNTER — Other Ambulatory Visit (HOSPITAL_BASED_OUTPATIENT_CLINIC_OR_DEPARTMENT_OTHER): Payer: Self-pay

## 2024-08-27 DIAGNOSIS — G8929 Other chronic pain: Secondary | ICD-10-CM | POA: Diagnosis not present

## 2024-08-27 DIAGNOSIS — M48061 Spinal stenosis, lumbar region without neurogenic claudication: Secondary | ICD-10-CM | POA: Diagnosis not present

## 2024-08-27 DIAGNOSIS — R200 Anesthesia of skin: Secondary | ICD-10-CM | POA: Diagnosis not present

## 2024-08-27 DIAGNOSIS — M5126 Other intervertebral disc displacement, lumbar region: Secondary | ICD-10-CM | POA: Diagnosis not present

## 2024-08-31 ENCOUNTER — Other Ambulatory Visit (HOSPITAL_BASED_OUTPATIENT_CLINIC_OR_DEPARTMENT_OTHER): Payer: Self-pay

## 2024-09-04 ENCOUNTER — Encounter: Payer: Self-pay | Admitting: Obstetrics and Gynecology

## 2024-09-04 ENCOUNTER — Ambulatory Visit (INDEPENDENT_AMBULATORY_CARE_PROVIDER_SITE_OTHER): Admitting: Obstetrics and Gynecology

## 2024-09-04 ENCOUNTER — Other Ambulatory Visit (HOSPITAL_BASED_OUTPATIENT_CLINIC_OR_DEPARTMENT_OTHER): Payer: Self-pay

## 2024-09-04 VITALS — BP 132/78 | HR 81 | Ht 66.5 in | Wt 235.2 lb

## 2024-09-04 DIAGNOSIS — Z30011 Encounter for initial prescription of contraceptive pills: Secondary | ICD-10-CM | POA: Diagnosis not present

## 2024-09-04 DIAGNOSIS — Z3009 Encounter for other general counseling and advice on contraception: Secondary | ICD-10-CM

## 2024-09-04 MED ORDER — NORETHINDRONE ACET-ETHINYL EST 1-20 MG-MCG PO TABS
1.0000 | ORAL_TABLET | Freq: Every day | ORAL | 11 refills | Status: DC
  Filled 2024-09-04: qty 21, 21d supply, fill #0

## 2024-09-04 NOTE — Progress Notes (Addendum)
° °  GYNECOLOGY PROGRESS NOTE  History:  23 y.o. G1P0101 presents to [facility] for birth control. She was on Nexplanon  in February and got it taken out in  August due to side effects. She was having irregular bleeding started OCP with nexplanon , minimally helpful. She was having continued headaches with nexplanon  After nexplanon , shestarted nuvaring August, working fine in the beginning then started having headaches and nausea.  She endorses on the 13th supposed to start period, but then started 21st experienced heavy bleeding, then period 2-3 days.  She took her nuvaring out and has not placed it again   She reports she has always had problems with cycle, previous OB said had PCOS based on prediabetes, irregular periods, abnormal hair growth. She would skip months at a time, or if get period last one and a half weeks  Normal on nuvaring just lasted a a litle over a week  The following portions of the patient's history were reviewed and updated as appropriate: allergies, current medications, past family history, past medical history, past social history, past surgical history and problem list. Last pap smear on 01/30/24 was NILM.  Health Maintenance Due  Topic Date Due   Meningococcal B Vaccine (1 of 2 - Standard) Never done   DTaP/Tdap/Td (1 - Tdap) Never done   Pneumococcal Vaccine (1 of 2 - PCV) Never done   Hepatitis B Vaccines 19-59 Average Risk (1 of 3 - 19+ 3-dose series) Never done   Influenza Vaccine  04/26/2024   COVID-19 Vaccine (3 - 2025-26 season) 05/27/2024     Review of Systems:  Pertinent items are noted in HPI.   Objective:  Physical Exam Blood pressure 132/78, pulse 81, height 5' 6.5 (1.689 m), weight 235 lb 3.2 oz (106.7 kg), last menstrual period 08/16/2024, not currently breastfeeding. VS reviewed, nursing note reviewed,  Constitutional: well developed, well nourished, no distress HEENT: normocephalic Pulm/chest wall: normal effort Neuro: alert and oriented   Skin: warm, dry Psych: affect normal Pelvic exam:deferred  Assessment & Plan:  1. Birth control counseling (Primary) 2. Encounter for initial prescription of contraceptive pills Discussed options for lower dose OCP vs POP given symptoms, also discussed optionf for IUD. Would like to try OCP. Plan to follow up, discussed if symptoms worsen on pill notify, will discontinue and trial POP  Instructed to start day one of cycle   - norethindrone -ethinyl estradiol  (LOESTRIN  1/20, 21,) 1-20 MG-MCG tablet; Take 1 tablet by mouth daily.  Dispense: 21 tablet; Refill: 11   Return in about 3 months (around 12/03/2024), or month follow up with Savilla Turbyfill.     Nidia Daring, FNP 9:51 PM

## 2024-09-04 NOTE — Progress Notes (Signed)
 Pt has Nuvaring. C/o nausea, vomiting and headaches and irregular periods with Nuva ring. Pt removed Nuvaring approx. 08-08-24

## 2024-09-23 ENCOUNTER — Emergency Department (HOSPITAL_BASED_OUTPATIENT_CLINIC_OR_DEPARTMENT_OTHER): Admitting: Radiology

## 2024-09-23 ENCOUNTER — Other Ambulatory Visit: Payer: Self-pay

## 2024-09-23 ENCOUNTER — Emergency Department (HOSPITAL_BASED_OUTPATIENT_CLINIC_OR_DEPARTMENT_OTHER)
Admission: EM | Admit: 2024-09-23 | Discharge: 2024-09-23 | Disposition: A | Discharge status: Home / Self Care | Attending: Emergency Medicine | Admitting: Emergency Medicine

## 2024-09-23 ENCOUNTER — Encounter (HOSPITAL_BASED_OUTPATIENT_CLINIC_OR_DEPARTMENT_OTHER): Payer: Self-pay | Admitting: Emergency Medicine

## 2024-09-23 ENCOUNTER — Emergency Department (HOSPITAL_BASED_OUTPATIENT_CLINIC_OR_DEPARTMENT_OTHER)

## 2024-09-23 DIAGNOSIS — N939 Abnormal uterine and vaginal bleeding, unspecified: Secondary | ICD-10-CM | POA: Diagnosis not present

## 2024-09-23 DIAGNOSIS — R509 Fever, unspecified: Secondary | ICD-10-CM | POA: Diagnosis present

## 2024-09-23 DIAGNOSIS — J111 Influenza due to unidentified influenza virus with other respiratory manifestations: Secondary | ICD-10-CM | POA: Diagnosis not present

## 2024-09-23 LAB — URINALYSIS, MICROSCOPIC (REFLEX): Bacteria, UA: NONE SEEN

## 2024-09-23 LAB — URINALYSIS, ROUTINE W REFLEX MICROSCOPIC
Glucose, UA: NEGATIVE mg/dL
Ketones, ur: 15 mg/dL — AB
Leukocytes,Ua: NEGATIVE
Nitrite: NEGATIVE
Specific Gravity, Urine: >=1.03 (ref 1.005–1.030)
pH: 6 (ref 5.0–8.0)

## 2024-09-23 LAB — CBC WITH DIFFERENTIAL/PLATELET
Abs Immature Granulocytes: 0.01 K/uL (ref 0.00–0.07)
Basophils Absolute: 0 K/uL (ref 0.0–0.1)
Basophils Relative: 0 %
Eosinophils Absolute: 0 K/uL (ref 0.0–0.5)
Eosinophils Relative: 0 %
HCT: 37 % (ref 36.0–46.0)
Hemoglobin: 11.9 g/dL — ABNORMAL LOW (ref 12.0–15.0)
Immature Granulocytes: 0 %
Lymphocytes Relative: 8 %
Lymphs Abs: 0.5 K/uL — ABNORMAL LOW (ref 0.7–4.0)
MCH: 25.1 pg — ABNORMAL LOW (ref 26.0–34.0)
MCHC: 32.2 g/dL (ref 30.0–36.0)
MCV: 77.9 fL — ABNORMAL LOW (ref 80.0–100.0)
Monocytes Absolute: 0.6 K/uL (ref 0.1–1.0)
Monocytes Relative: 11 %
Neutro Abs: 4.4 K/uL (ref 1.7–7.7)
Neutrophils Relative %: 81 %
Platelets: 161 K/uL (ref 150–400)
RBC: 4.75 MIL/uL (ref 3.87–5.11)
RDW: 15.9 % — ABNORMAL HIGH (ref 11.5–15.5)
WBC: 5.5 K/uL (ref 4.0–10.5)
nRBC: 0 % (ref 0.0–0.2)

## 2024-09-23 LAB — COMPREHENSIVE METABOLIC PANEL WITH GFR
ALT: 22 U/L (ref 0–44)
AST: 18 U/L (ref 15–41)
Albumin: 4.2 g/dL (ref 3.5–5.0)
Alkaline Phosphatase: 93 U/L (ref 38–126)
Anion gap: 13 (ref 5–15)
BUN: 7 mg/dL (ref 6–20)
CO2: 21 mmol/L — ABNORMAL LOW (ref 22–32)
Calcium: 9.4 mg/dL (ref 8.9–10.3)
Chloride: 102 mmol/L (ref 98–111)
Creatinine, Ser: 0.75 mg/dL (ref 0.44–1.00)
GFR, Estimated: >60 mL/min
Glucose, Bld: 100 mg/dL — ABNORMAL HIGH (ref 70–99)
Potassium: 3.5 mmol/L (ref 3.5–5.1)
Sodium: 136 mmol/L (ref 135–145)
Total Bilirubin: 0.3 mg/dL (ref 0.0–1.2)
Total Protein: 7.4 g/dL (ref 6.5–8.1)

## 2024-09-23 LAB — HCG, SERUM, QUALITATIVE: Preg, Serum: NEGATIVE

## 2024-09-23 LAB — PREGNANCY, URINE: Preg Test, Ur: NEGATIVE

## 2024-09-23 LAB — LIPASE, BLOOD: Lipase: 20 U/L (ref 11–51)

## 2024-09-23 MED ORDER — ONDANSETRON 4 MG PO TBDP
4.0000 mg | ORAL_TABLET | Freq: Once | ORAL | Status: AC
  Administered 2024-09-23: 4 mg via ORAL
  Filled 2024-09-23: qty 1

## 2024-09-23 MED ORDER — ALBUTEROL SULFATE HFA 108 (90 BASE) MCG/ACT IN AERS
1.0000 | INHALATION_SPRAY | Freq: Once | RESPIRATORY_TRACT | Status: AC
  Administered 2024-09-23: 1 via RESPIRATORY_TRACT
  Filled 2024-09-23: qty 6.7

## 2024-09-23 MED ORDER — ACETAMINOPHEN 500 MG PO TABS
1000.0000 mg | ORAL_TABLET | Freq: Once | ORAL | Status: AC
  Administered 2024-09-23: 1000 mg via ORAL
  Filled 2024-09-23: qty 2

## 2024-09-23 MED ORDER — ONDANSETRON 4 MG PO TBDP: 4.0000 mg | ORAL_TABLET | Freq: Three times a day (TID) | ORAL | 0 refills | Status: DC | PRN

## 2024-09-23 MED ORDER — OSELTAMIVIR PHOSPHATE 75 MG PO CAPS: 75.0000 mg | ORAL_CAPSULE | Freq: Two times a day (BID) | ORAL | 0 refills | Status: DC

## 2024-09-23 NOTE — ED Provider Triage Note (Signed)
 Emergency Medicine Provider Triage Evaluation Note  Jessica Hendrix , a 23 y.o. female  was evaluated in triage.  Pt complains of body aches, headache, rhinorrhea, congestion, cough, sore throat. Fever of 103F. Has not taken any OTC medications. Also with nausea and vomiting.    Educated patient that these symptoms seem to be related to viral illness. Patient stating that they would prefer to stay in ED for further workup d/t lower abdominal pain x1 month and heavy vaginal bleeding x1 week. Last BM yesterday.  Review of Systems  Positive:  Negative:   Physical Exam  BP 138/71 (BP Location: Right Arm)   Pulse (!) 119   Temp 99.6 F (37.6 C)   Resp 20   LMP 08/16/2024   SpO2 99%  Gen:   Awake, no distress   Resp:  Normal effort  MSK:   Moves extremities without difficulty  Other:    Medical Decision Making  Medically screening exam initiated at 1:48 PM.  Appropriate orders placed.  Jessica Hendrix was informed that the remainder of the evaluation will be completed by another provider, this initial triage assessment does not replace that evaluation, and the importance of remaining in the ED until their evaluation is complete.     Jessica Hendrix, [place] 09/23/24 1355

## 2024-09-23 NOTE — ED Triage Notes (Signed)
 Body ache weakness Headache Started last night

## 2024-09-23 NOTE — ED Notes (Signed)
 Patient notified of need for urine sample to complete eval/assessment. Specimen cup provided and instructions for clean catch given. Patient will notify staff when able to provide

## 2024-09-23 NOTE — ED Provider Notes (Signed)
 " [place] EMERGENCY DEPARTMENT AT [facility] Provider Note   CSN: [id number] Arrival date & time: 09/23/24  1212     Patient presents with: Generalized Body Aches   Jessica Hendrix is a 23 y.o. female.   Patient with history of asthma presents to the emergency department for evaluation of flulike illness.  Symptoms started last night.  She describes body aches, low-grade fever, cough, sore throat.  Unknown sick contacts.  No vomiting or diarrhea but she has been nauseous.  Patient also reports ongoing lower abdominal pain, mainly left lower pelvic pain, over several weeks.  Patient states that she discontinued 2 different birth controls recently.  She then developed cramping and bleeding.  She did not have bleeding today, but pelvic ultrasound was ordered and afterwards she noted some bleeding.       Prior to Admission medications  Medication Sig Start Date End Date Taking? Authorizing Provider  albuterol  (VENTOLIN  HFA) 108 (90 Base) MCG/ACT inhaler Inhale 2 puffs into the lungs every 6 (six) hours as needed for wheezing or shortness of breath. 06/11/24   Gladis Elsie BROCKS, PA-C  cyclobenzaprine  (FLEXERIL ) 5 MG tablet Take 1 tablet (5 mg total) by mouth at bedtime as needed. Patient not taking: Reported on 09/04/2024 07/15/24   Christopher Savannah, PA-C  fluticasone  (FLONASE ) 50 MCG/ACT nasal spray Place 2 sprays into both nostrils daily. 06/11/24   Gladis Elsie BROCKS, PA-C  norethindrone -ethinyl estradiol  (LOESTRIN  1/20, 21,) 1-20 MG-MCG tablet Take 1 tablet by mouth daily. 09/04/24   Delores Nidia CROME, FNP  ondansetron  (ZOFRAN -ODT) 4 MG disintegrating tablet Take 1 tablet (4 mg total) by mouth every 8 (eight) hours as needed for nausea or vomiting. 07/15/24   Christopher Savannah, PA-C  predniSONE  (DELTASONE ) 20 MG tablet Take 2 tablets daily with breakfast. Patient not taking: Reported on 09/04/2024 07/15/24   Christopher Savannah, PA-C    Allergies: Cinnamon, Penicillins, and Citrus    Review  of Systems  Updated Vital Signs BP 138/71 (BP Location: Right Arm)   Pulse (!) 119   Temp 99.6 F (37.6 C)   Resp 20   LMP 08/16/2024   SpO2 99%   Physical Exam Vitals and nursing note reviewed.  Constitutional:      Appearance: She is well-developed.  HENT:     Head: Normocephalic and atraumatic.     Jaw: No trismus.     Right Ear: Ear canal and external ear normal. A middle ear effusion is present. Tympanic membrane is erythematous and bulging.     Left Ear: Tympanic membrane, ear canal and external ear normal. Tympanic membrane is not erythematous or bulging.     Nose: Congestion and rhinorrhea present. No mucosal edema.     Mouth/Throat:     Mouth: Mucous membranes are moist. Mucous membranes are not dry. No oral lesions.     Pharynx: Uvula midline. Posterior oropharyngeal erythema present. No oropharyngeal exudate or uvula swelling.     Tonsils: No tonsillar abscesses.  Eyes:     General:        Right eye: No discharge.        Left eye: No discharge.     Conjunctiva/sclera: Conjunctivae normal.  Cardiovascular:     Rate and Rhythm: Normal rate and regular rhythm.     Heart sounds: Normal heart sounds.  Pulmonary:     Effort: Pulmonary effort is normal. No respiratory distress.     Breath sounds: Normal breath sounds. No wheezing or rales.  Abdominal:  Palpations: Abdomen is soft.     Tenderness: There is abdominal tenderness. There is no guarding or rebound.     Comments: Mild left lower quadrant abdominal tenderness without rebound  Musculoskeletal:     Cervical back: Normal range of motion and neck supple.  Lymphadenopathy:     Cervical: No cervical adenopathy.  Skin:    General: Skin is warm and dry.  Neurological:     Mental Status: She is alert.  Psychiatric:        Mood and Affect: Mood normal.     (all labs ordered are listed, but only abnormal results are displayed) Labs Reviewed  COMPREHENSIVE METABOLIC PANEL WITH GFR - Abnormal; Notable for the  following components:      Result Value   CO2 21 (*)    Glucose, Bld 100 (*)    All other components within normal limits  URINALYSIS, ROUTINE W REFLEX MICROSCOPIC - Abnormal; Notable for the following components:   Hgb urine dipstick SMALL (*)    Bilirubin Urine SMALL (*)    Ketones, ur 15 (*)    Protein, ur TRACE (*)    All other components within normal limits  CBC WITH DIFFERENTIAL/PLATELET - Abnormal; Notable for the following components:   Hemoglobin 11.9 (*)    MCV 77.9 (*)    MCH 25.1 (*)    RDW 15.9 (*)    Lymphs Abs 0.5 (*)    All other components within normal limits  LIPASE, BLOOD  HCG, SERUM, QUALITATIVE  PREGNANCY, URINE  URINALYSIS, MICROSCOPIC (REFLEX)    EKG: None  Radiology: DG Chest 2 View Result Date: 09/23/2024 EXAM: 2 VIEW(S) XRAY OF THE CHEST 09/23/2024 04:19:00 PM COMPARISON: 04/21/2024 CLINICAL HISTORY: SOB FINDINGS: LUNGS AND PLEURA: No focal pulmonary opacity. No pleural effusion. No pneumothorax. HEART AND MEDIASTINUM: No acute abnormality of the cardiac and mediastinal silhouettes. BONES AND SOFT TISSUES: No acute osseous abnormality. IMPRESSION: 1. No acute process. Electronically signed by: Greig Pique MD 09/23/2024 06:16 PM EST RP Workstation: [id number]   US  PELVIC COMPLETE W TRANSVAGINAL AND TORSION R/O Result Date: 09/23/2024 EXAM: US  Pelvis, Complete Transvaginal and Transabdominal without Doppler TECHNIQUE: Transabdominal and transvaginal pelvic duplex ultrasound using B-mode/gray scaled imaging without Doppler spectral analysis and color flow was obtained. COMPARISON: US  Pelvis Complete 10/08/2023. CLINICAL HISTORY: Bilateral pelvic pain for 1.5 weeks. FINDINGS: UTERUS: Uterus measures 6.6 x 2.9 x 4.8 cm. Myometrial echotexture is diffusely heterogeneous. ENDOMETRIAL STRIPE: Endometrium measures 0.1 cm. There is a hypoechoic area in the lower uterine segment endometrium measuring 6 x 3 x 6 mm, possibly blood products. RIGHT OVARY: Right ovary  measures 3.3 x 1.5 x 2.3 cm. Right ovary is within normal limits. There is normal arterial and venous Doppler flow. LEFT OVARY: Left ovary measures 2.7 x 1.3 x 1.6 cm. Left ovary is within normal limits. There is normal arterial and venous Doppler flow. FREE FLUID: No free fluid. IMPRESSION: 1. Diffusely heterogeneous uterine echotexture may be related to fibroid change or adenomyosis. 2. Small hypoechoic focus in the lower uterine segment endometrium measuring 6 x 3 x 6 mm, which may represent blood products. Electronically signed by: Greig Pique MD 09/23/2024 06:11 PM EST RP Workstation: [id number]     Procedures   Medications Ordered in the ED  ondansetron  (ZOFRAN -ODT) disintegrating tablet 4 mg (4 mg Oral Given 09/23/24 1403)  acetaminophen  (TYLENOL ) tablet 1,000 mg (1,000 mg Oral Given 09/23/24 1403)  albuterol  (VENTOLIN  HFA) 108 (90 Base) MCG/ACT inhaler 1 puff (1 puff Inhalation Given  09/23/24 1404)   ED Course  Patient seen and examined. History obtained directly from patient. Work-up including labs, imaging, EKG ordered in triage, if performed, were reviewed.    Labs/EKG: Independently reviewed and interpreted.  This included: CBC showing normal white blood cell count, minimal anemia with hemoglobin of 11.9; CMP unremarkable; lipase normal; hemoglobin normal.  Imaging: Independently visualized and interpreted.  This included: Chest x-ray, appears normal without infiltrate, no radiology read yet.  Also awaiting pelvic ultrasound report.  Medications/Fluids: None ordered  Most recent vital signs reviewed and are as follows: BP 138/71 (BP Location: Right Arm)   Pulse (!) 119   Temp 99.6 F (37.6 C)   Resp 20   LMP 08/16/2024   SpO2 99%   Initial impression: Flulike illness, high clinical concern for influenza.  Patient would like Tamiflu  as she is in the window for this.  Will also plan to discharge with Zofran .  Awaiting results of pelvic ultrasound which will likely require  outpatient OB/GYN follow-up.  She has established care.  6:31 PM Reassessment performed. Patient appears stable, comfortable.  Heart rate low 100s.  Reviewed chest x-ray, agree negative.  Reviewed pelvic ultrasound results, shows irregularity in the uterus which will need followed up, but no emergent findings.  Reviewed pertinent lab work and imaging with patient at bedside. Questions answered.   Most current vital signs reviewed and are as follows: BP 126/68 (BP Location: Right Arm)   Pulse (!) 118   Temp 99.6 F (37.6 C) (Oral)   Resp 19   LMP 08/16/2024   SpO2 100%   Plan: Discharge to home.   Prescriptions written for: Tamiflu , Zofran   Other home care instructions discussed: Discussed importance of rest, maintaining good hydration. Also discussed use of OTC meds as desired for symptomatic treatment. Discussed typical course of generic viral illness.   ED return instructions discussed: Encouraged return with severe symptoms or significant worsening of current symptoms.  This includes high persistent fever, persistent vomiting, worsening difficulty breathing or shortness of breath, increased work of breathing, or other concerns.  Follow-up instructions discussed: Patient encouraged to follow-up with their PCP in 5 days if not improving.                                   Medical Decision Making Risk Prescription drug management.   Patient presents with flulike symptoms.  Clinical suspicion for influenza.  Patient is well-appearing.  Vital signs are stable, fevers controlled.  No clinical signs and symptoms of significant dehydration and is currently tolerating fluids.  Lab workup is reassuring without any emergent findings.  Considered secondary infection such as pneumonia, however lungs are clear on exam and patient without hypoxia.  Low concern for sepsis.  In addition, low clinical concern for mononucleosis.  Low clinical concern for PE based on symptoms consistent with  influenza.  Given well appearance, supportive therapy indicated currently.  Discussed signs and symptoms to return with patient at bedside.  Patient seems reliable to return as needed.  In regards to vaginal bleeding, likely related to recent discontinuing of birth control.  Ultrasound without any emergent findings.  Patient to follow-up as outpatient.  The patient's vital signs, pertinent lab work and imaging were reviewed and interpreted as discussed in the ED course. Hospitalization was considered for further testing, treatments, or serial exams/observation. However as patient is well-appearing, has a stable exam, and reassuring studies today, I do  not feel that they warrant admission at this time. This plan was discussed with the patient who verbalizes agreement and comfort with this plan and seems reliable and able to return to the Emergency Department with worsening or changing symptoms.        Final diagnoses:  Influenza-like illness  Vaginal bleeding    ED Discharge Orders          Ordered    oseltamivir  (TAMIFLU ) 75 MG capsule  Every 12 hours        09/23/24 1828    ondansetron  (ZOFRAN -ODT) 4 MG disintegrating tablet  Every 8 hours PRN        09/23/24 1828               Desiderio Chew, PA-C 09/23/24 1833  "

## 2024-09-23 NOTE — Discharge Instructions (Addendum)
 Please read and follow all provided instructions.  Your diagnoses today include:  1. Influenza-like illness   2. Vaginal bleeding    Tests performed today include: Complete blood cell count: Normal blood counts Complete metabolic panel: Normal liver and kidney function Lipase (pancreas function test): Was normal Urinalysis (urine test): No signs of infection Pregnancy test (urine or blood, in women only): Negative Chest x-ray: Did not show any pneumonia or other problems with the lungs Ultrasound of the pelvis: Showed possible fibroid or leiomyoma, no emergencies.  Please follow-up with OB/GYN. Vital signs. See below for your results today.   Medications prescribed:  Tamiflu  - medication for influenza  This medication, when taken within the first 48 hours of illness, may help decrease the severity of the flu and cause symptoms to improve approximately 12 hours sooner. About 1 out of 5 patients may have diarrhea and vomiting from this medication.   Zofran  (ondansetron ) - for nausea and vomiting  Take any prescribed medications only as directed.  Home care instructions:  Follow any educational materials contained in this packet. Please continue drinking plenty of fluids. Use over-the-counter cold and flu medications as needed as directed on packaging for symptom relief. You may also use ibuprofen  or tylenol  as directed on packaging for pain or fever.   BE VERY CAREFUL not to take multiple medicines containing Tylenol  (also called acetaminophen ). Doing so can lead to an overdose which can damage your liver and cause liver failure and possibly death.   Follow-up instructions: Please follow-up with your primary care provider in the next 5 days for further evaluation of your symptoms if not improving.   Return instructions:  Please return to the Emergency Department if you experience worsening symptoms. Please return if you have a high fever greater than 101 degrees not controlled with  over-the-counter medications, persistent vomiting and cannot keep down fluids, or worsening trouble breathing. Please return if you have any other emergent concerns.  Additional Information:  Your vital signs today were: BP 126/68 (BP Location: Right Arm)   Pulse (!) 118   Temp 99.6 F (37.6 C) (Oral)   Resp 19   LMP 08/16/2024   SpO2 100%  If your blood pressure (BP) was elevated above 135/85 this visit, please have this repeated by your doctor within one month.

## 2024-09-23 NOTE — ED Notes (Signed)

## 2024-10-02 NOTE — Therapy (Signed)
 " OUTPATIENT PHYSICAL THERAPY THORACOLUMBAR EVALUATION   Patient Name: Jessica Hendrix MRN: [id number] DOB:10/08/2000, 24 y.o., female Today's Date: 10/04/2024  END OF SESSION:  PT End of Session - 10/04/24 1336     Visit Number 1    Number of Visits 12    Date for Recertification  12/02/24    Authorization Type [facility] [employer]    Authorization Time Period after 25 visits    PT Start Time 1335    PT Stop Time 1415    PT Time Calculation (min) 40 min    Activity Tolerance Patient tolerated treatment well    Behavior During Therapy [facility] for tasks assessed/performed          Past Medical History:  Diagnosis Date   Allergy    Anemia    Anxiety    Asthma    Constipation    Depression    Depression, major, single episode, moderate (HCC) 12/05/2019   GAD (generalized anxiety disorder) 03/08/2020   Gestational thrombocytopenia, third trimester 07/20/2023   Postpartum hypertension 09/19/2023   Pyelonephritis    Twitch 12/05/2019   UTI (urinary tract infection)    Past Surgical History:  Procedure Laterality Date   CESAREAN SECTION N/A 09/15/2023   Procedure: CESAREAN SECTION;  Surgeon: Erik Kieth BROCKS, MD;  Location: [facility] LD ORS;  Service: Obstetrics;  Laterality: N/A;   WISDOM TOOTH EXTRACTION     Patient Active Problem List   Diagnosis Date Noted   Moderate persistent asthma without complication 01/13/2023    PCP: Wendolyn Jenkins Jansky, MD   REFERRING PROVIDER: Orlean Suzen Lacks, NP  REFERRING DIAG: M54.16 (ICD-10-CM) - Radiculopathy, lumbar region  Rationale for Evaluation and Treatment: Rehabilitation  THERAPY DIAG:  Other low back pain - Plan: PT plan of care cert/re-cert  Radiculopathy, lumbar region - Plan: PT plan of care cert/re-cert  Muscle weakness (generalized) - Plan: PT plan of care cert/re-cert  ONSET DATE: March 2024  SUBJECTIVE:                                                                                                                                                                                            SUBJECTIVE STATEMENT: Very pleasant out of 24 year old comes in today as a follow-up for her MRI. She continues to have back pain and left leg pain in her buttocks and hip. She denies any numbness or tingling. She did have an epidural when she was in labor. She had a spinal fluid leak after that. They did a blood patch. The pain is pretty severe at times. She rates it a 6/10, constant, and achy. Activity makes it  worse. Denies any change in bowel bladder habits.  PERTINENT HISTORY:  Relates a history of tight tendons as a child requiring PT.   PAIN:  Are you having pain? Yes: NPRS scale: 6/10 Pain location: Low back Pain description: ache  Aggravating factors: activity and prolonged weight bearing tasks including sitting Relieving factors: lying/sitting in a certain position  PRECAUTIONS: None  RED FLAGS: None   WEIGHT BEARING RESTRICTIONS: No  FALLS:  Has patient fallen in last 6 months? No  OCCUPATION: [name]  PLOF: Independent  PATIENT GOALS: To manage my back symptoms  NEXT MD VISIT: TBD  OBJECTIVE:  Note: Objective measures were completed at Evaluation unless otherwise noted.  DIAGNOSTIC FINDINGS:  IMPRESSION: 1. Tiny dorsal subdural fluid collection at the thoracolumbar junction and indistinct appearance of the cauda equina nerve roots following recent total spine Myelography, probably are incidental findings. No abnormal thecal sac or nerve root enhancement. No evidence of an epidural process. No evidence of spinal infection. 2. Stable isolated L4-L5 disc degeneration with no significant stenosis.     Electronically Signed   By: VEAR Hurst M.D.   On: 12/03/2023 05:59  PATIENT SURVEYS:  Modified Oswestry:   Interpretation of scores: Score Category Description  0-20% Minimal Disability The patient can cope with most living activities. Usually no treatment is indicated apart from  advice on lifting, sitting and exercise  21-40% Moderate Disability The patient experiences more pain and difficulty with sitting, lifting and standing. Travel and social life are more difficult and they may be disabled from work. Personal care, sexual activity and sleeping are not grossly affected, and the patient can usually be managed by conservative means  41-60% Severe Disability Pain remains the main problem in this group, but activities of daily living are affected. These patients require a detailed investigation  61-80% Crippled Back pain impinges on all aspects of the patients life. Positive intervention is required  81-100% Bed-bound These patients are either bed-bound or exaggerating their symptoms  Bluford FORBES [name], et al. Surgery versus conservative management of stable thoracolumbar fracture: the PRESTO feasibility RCT. [place] ([place]): [employer]; 2021 Nov. [facility] Technology Assessment, No. 25.62.) Appendix 3, Oswestry Disability Index category descriptors. Available from: Findjewelers.cz  Minimally Clinically Important Difference (MCID) = 12.8%  MUSCLE LENGTH: Hamstrings: Right 45 deg; Left 45 deg Thomas test: PKB unremarkable  POSTURE: No Significant postural limitations  PALPATION: Globally tender to all paraspinal muscles  LUMBAR ROM:   AROM eval  Flexion 75%p!  Extension 75%p!  Right lateral flexion 75%  Left lateral flexion 75%  Right rotation 75%  Left rotation 50%   (Blank rows = not tested)  LOWER EXTREMITY ROM:   WFL throughout  Active  Right eval Left eval  Hip flexion    Hip extension    Hip abduction    Hip adduction    Hip internal rotation    Hip external rotation    Knee flexion    Knee extension    Ankle dorsiflexion    Ankle plantarflexion    Ankle inversion    Ankle eversion     (Blank rows = not tested)  LOWER EXTREMITY MMT:  see 30s chair stand test  MMT Right eval  Left eval  Hip flexion    Hip extension    Hip abduction    Hip adduction    Hip internal rotation    Hip external rotation    Knee flexion    Knee extension  Ankle dorsiflexion    Ankle plantarflexion    Ankle inversion    Ankle eversion     (Blank rows = not tested)  LUMBAR SPECIAL TESTS:  Straight leg raise test: limited to 45d B due to soft tissue restrictions and pain and Slump test: Negative  FUNCTIONAL TESTS:  30 seconds chair stand test 2 reps  GAIT: Distance walked: 7ftx2 Assistive device utilized: None Level of assistance: Complete Independence Comments: slow and guarded  TREATMENT:                [facility] Adult PT Treatment:                                                DATE: 10/04/24 Eval and HEP Self Care: Additional minutes spent for educating on updated Therapeutic Home Exercise Program as well as comparing current status to condition at start of symptoms. This included exercises focusing on stretching, strengthening, with focus on eccentric aspects. Long term goals include an improvement in range of motion, strength, endurance as well as avoiding reinjury. Patient's frequency would include in 1-2 times a day, 3-5 times a week for a duration of 6-12 weeks. Proper technique shown and discussed handout in great detail. All questions were discussed and addressed.                                                                                                              PATIENT EDUCATION:  Education details: Discussed eval findings, rehab rationale and POC and patient is in agreement  Person educated: Patient Education method: Explanation and Handouts Education comprehension: verbalized understanding and needs further education  HOME EXERCISE PROGRAM: Access Code: [email or web address] URL: [email or web address] Date: 10/04/2024 Prepared by: Reyes Kohut  Exercises - Seated Table Hamstring Stretch  - 2-3 x daily - 5 x weekly - 3 sets - 10 reps - 30s  hold - Seated Sciatic Tensioner  - 2-3 x daily - 5 x weekly - 3 sets - 10 reps - Supine Posterior Pelvic Tilt  - 2-3 x daily - 5 x weekly - 3 sets - 10 reps - 3s hold  ASSESSMENT:  CLINICAL IMPRESSION: Patient is a 24 y.o. female who was seen today for physical therapy evaluation and treatment for low back pain.  Symptoms are more global in nature extending from thoracic region to distal LE's.  Overall trunk mobility is [facility] but restricted by soft tissue discomfort/irritation.  Patient globally tender through paraspinal and LE and hip major muscle groups, similar to fibromyalgia symptoms.  Neural tension signs unremarkable to symptoms due to seem orthopedic in nature, possibly systemic.  Patient agreeable to trial of OPPT focused on stretching and mobility tasks.  OBJECTIVE IMPAIRMENTS: Abnormal gait, decreased activity tolerance, decreased endurance, decreased knowledge of condition, decreased mobility, difficulty walking, decreased ROM, decreased strength, increased fascial restrictions, impaired perceived functional ability, obesity, and pain.   ACTIVITY LIMITATIONS: carrying, lifting,  bending, sitting, standing, squatting, sleeping, bed mobility, and caring for others  PERSONAL FACTORS: Age, Fitness, Past/current experiences, and Time since onset of injury/illness/exacerbation are also affecting patient's functional outcome.   REHAB POTENTIAL: Good  CLINICAL DECISION MAKING: Stable/uncomplicated  EVALUATION COMPLEXITY: Moderate   GOALS: Goals reviewed with patient? No    SHORT TERM GOALS=LONG TERM GOALS: Target date: 11/29/2024    Patient will increase 30s chair stand reps from 2 to 4 with/without arms to demonstrate and improved functional ability with less pain/difficulty as well as reduce fall risk.  Baseline: 2 Goal status: INITIAL  2.  Patient will score at least 15/50 on ODI to signify clinically meaningful improvement in functional abilities.   Baseline: 25/50 Goal status:  INITIAL  3.  Patient will acknowledge 4/10 pain at least once during episode of care  Baseline: 6/10 Goal status: INITIAL  4.  Patient to demonstrate independence in HEP  Baseline: [id number] Goal status: INITIAL   PLAN:  PT FREQUENCY: 1-2x/week  PT DURATION: 6 weeks  PLANNED INTERVENTIONS: [id number]-Therapeutic exercises, 97530- Therapeutic activity, [id number]- Neuromuscular re-education, 97535- Self Care, [id number]- Manual therapy, and Patient/Family education.  PLAN FOR NEXT SESSION: HEP review and update, manual techniques as appropriate, aerobic tasks, ROM and flexibility activities, strengthening and PREs, TPDN, gait and balance training,aquatic therapy, modalities for pain and NMRE      Ilina Xu M Yilia Sacca, PT 10/04/2024, 2:31 PM  "

## 2024-10-04 ENCOUNTER — Other Ambulatory Visit: Payer: Self-pay

## 2024-10-04 ENCOUNTER — Ambulatory Visit: Attending: Family Medicine

## 2024-10-04 DIAGNOSIS — M5459 Other low back pain: Secondary | ICD-10-CM | POA: Diagnosis present

## 2024-10-04 DIAGNOSIS — M5416 Radiculopathy, lumbar region: Secondary | ICD-10-CM | POA: Diagnosis present

## 2024-10-04 DIAGNOSIS — M6281 Muscle weakness (generalized): Secondary | ICD-10-CM | POA: Insufficient documentation

## 2024-10-11 ENCOUNTER — Ambulatory Visit: Admitting: Physician Assistant

## 2024-10-11 ENCOUNTER — Encounter: Payer: Self-pay | Admitting: Physician Assistant

## 2024-10-11 VITALS — BP 104/68 | HR 69 | Temp 97.5°F | Ht 66.5 in | Wt 233.8 lb

## 2024-10-11 DIAGNOSIS — M7918 Myalgia, other site: Secondary | ICD-10-CM | POA: Diagnosis not present

## 2024-10-11 DIAGNOSIS — R7303 Prediabetes: Secondary | ICD-10-CM

## 2024-10-11 DIAGNOSIS — F419 Anxiety disorder, unspecified: Secondary | ICD-10-CM | POA: Diagnosis not present

## 2024-10-11 DIAGNOSIS — E538 Deficiency of other specified B group vitamins: Secondary | ICD-10-CM

## 2024-10-11 DIAGNOSIS — F32A Depression, unspecified: Secondary | ICD-10-CM

## 2024-10-11 DIAGNOSIS — R252 Cramp and spasm: Secondary | ICD-10-CM

## 2024-10-11 DIAGNOSIS — D509 Iron deficiency anemia, unspecified: Secondary | ICD-10-CM

## 2024-10-11 DIAGNOSIS — H9191 Unspecified hearing loss, right ear: Secondary | ICD-10-CM

## 2024-10-11 DIAGNOSIS — H6123 Impacted cerumen, bilateral: Secondary | ICD-10-CM

## 2024-10-11 LAB — IBC + FERRITIN
Ferritin: 12.3 ng/mL (ref 10.0–291.0)
Iron: 36 ug/dL — ABNORMAL LOW (ref 42–145)
Saturation Ratios: 8.1 % — ABNORMAL LOW (ref 20.0–50.0)
TIBC: 443.8 ug/dL (ref 250.0–450.0)
Transferrin: 317 mg/dL (ref 212.0–360.0)

## 2024-10-11 LAB — COMPREHENSIVE METABOLIC PANEL WITH GFR
ALT: 20 U/L (ref 3–35)
AST: 18 U/L (ref 5–37)
Albumin: 4.1 g/dL (ref 3.5–5.2)
Alkaline Phosphatase: 71 U/L (ref 39–117)
BUN: 11 mg/dL (ref 6–23)
CO2: 25 meq/L (ref 19–32)
Calcium: 9.3 mg/dL (ref 8.4–10.5)
Chloride: 107 meq/L (ref 96–112)
Creatinine, Ser: 0.84 mg/dL (ref 0.40–1.20)
GFR: 99.01 mL/min
Glucose, Bld: 92 mg/dL (ref 70–99)
Potassium: 4.2 meq/L (ref 3.5–5.1)
Sodium: 138 meq/L (ref 135–145)
Total Bilirubin: 0.4 mg/dL (ref 0.2–1.2)
Total Protein: 7.4 g/dL (ref 6.0–8.3)

## 2024-10-11 LAB — CBC WITH DIFFERENTIAL/PLATELET
Basophils Absolute: 0 K/uL (ref 0.0–0.1)
Basophils Relative: 0.5 % (ref 0.0–3.0)
Eosinophils Absolute: 0 K/uL (ref 0.0–0.7)
Eosinophils Relative: 0.8 % (ref 0.0–5.0)
HCT: 38.4 % (ref 36.0–46.0)
Hemoglobin: 12.3 g/dL (ref 12.0–15.0)
Lymphocytes Relative: 30 % (ref 12.0–46.0)
Lymphs Abs: 1.8 K/uL (ref 0.7–4.0)
MCHC: 32.2 g/dL (ref 30.0–36.0)
MCV: 77.1 fl — ABNORMAL LOW (ref 78.0–100.0)
Monocytes Absolute: 0.4 K/uL (ref 0.1–1.0)
Monocytes Relative: 6 % (ref 3.0–12.0)
Neutro Abs: 3.8 K/uL (ref 1.4–7.7)
Neutrophils Relative %: 62.7 % (ref 43.0–77.0)
Platelets: 153 K/uL (ref 150.0–400.0)
RBC: 4.98 Mil/uL (ref 3.87–5.11)
RDW: 16.7 % — ABNORMAL HIGH (ref 11.5–15.5)
WBC: 6.1 K/uL (ref 4.0–10.5)

## 2024-10-11 LAB — TSH: TSH: 1.6 u[IU]/mL (ref 0.35–5.50)

## 2024-10-11 LAB — VITAMIN B12: Vitamin B-12: 300 pg/mL (ref 211–911)

## 2024-10-11 LAB — C-REACTIVE PROTEIN: CRP: 0.8 mg/dL — ABNORMAL LOW (ref 1.0–20.0)

## 2024-10-11 LAB — MAGNESIUM: Magnesium: 1.8 mg/dL (ref 1.5–2.5)

## 2024-10-11 LAB — HEMOGLOBIN A1C: Hgb A1c MFr Bld: 5.9 % (ref 4.6–6.5)
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
## 2024-10-11 NOTE — Patient Instructions (Signed)
" °  VISIT SUMMARY: During your visit, we addressed your ear fullness, muscle pain, iron deficiency anemia, vitamin B12 deficiency, and concerns about prediabetes. We discussed referrals, diagnostic tests, and lifestyle modifications to manage your symptoms.  YOUR PLAN: CHRONIC CERUMEN IMPACTION WITH RIGHT-SIDED HEARING LOSS: You have chronic ear wax buildup and hearing loss in your right ear. Your ear canals are small, and the hearing loss is not due to wax buildup. -You are referred to an ENT specialist for further evaluation and management. -Use softening drops like Debrox on weekends. -Consider using Flonase  or Claritin for potential Eustachian tube dysfunction.  GENERALIZED MYALGIA AND MUSCLE CRAMPS: You experience chronic muscle pain and cramps, which may be related to fibromyalgia. Physical therapy has not been effective. -We ordered an ANA panel and inflammatory markers to check for autoimmune conditions. -If the ANA test is positive, you will be referred to a rheumatologist for further evaluation. -If the ANA test is negative, we may refer you to sports medicine for further evaluation.  IRON DEFICIENCY ANEMIA: You have low iron levels and difficulty with iron supplements due to gastrointestinal side effects. Possible celiac disease may be contributing to this. -We ordered blood work to check your current iron levels and anemia status. -We discussed dietary sources of iron and the potential need for alternative supplementation methods.  VITAMIN B12 DEFICIENCY: You have a vitamin B12 deficiency. -We ordered blood work to check your current B12 levels.  PREDIABETES: You have concerns about recent weight gain and poor dietary habits affecting your blood sugar levels. -We ordered an A1c test to evaluate your current blood sugar control. -We discussed dietary changes and lifestyle modifications to manage prediabetes.                      Contains text generated by  Abridge.                                 Contains text generated by Abridge.   "

## 2024-10-13 LAB — ANA, IFA COMPREHENSIVE PANEL
Anti Nuclear Antibody (ANA): NEGATIVE
ENA SM Ab Ser-aCnc: <1 AI
SM/RNP: <1 AI
SSA (Ro) (ENA) Antibody, IgG: <1 AI
SSB (La) (ENA) Antibody, IgG: <1 AI
Scleroderma (Scl-70) (ENA) Antibody, IgG: <1 AI
ds DNA Ab: <1 [IU]/mL

## 2024-10-13 LAB — RHEUMATOID FACTOR: Rheumatoid fact SerPl-aCnc: <10 [IU]/mL

## 2024-10-13 LAB — SEDIMENTATION RATE: Sed Rate: 6 mm/h (ref 0–20)

## 2024-10-14 NOTE — Therapy (Unsigned)
 " OUTPATIENT PHYSICAL THERAPY TREATMENT NOTE   Patient Name: Jessica Hendrix MRN: [id number] DOB:2001-01-14, 24 y.o., female Today's Date: 10/15/2024  END OF SESSION:  PT End of Session - 10/15/24 1529     Visit Number 2    Number of Visits 12    Date for Recertification  12/02/24    Authorization Type [facility] [employer]    Authorization Time Period after 25 visits    PT Start Time 1530    PT Stop Time 1610    PT Time Calculation (min) 40 min    Activity Tolerance Patient tolerated treatment well    Behavior During Therapy [facility] for tasks assessed/performed           Past Medical History:  Diagnosis Date   Allergy    Anemia    Anxiety    Asthma    Constipation    Depression    Depression, major, single episode, moderate (HCC) 12/05/2019   GAD (generalized anxiety disorder) 03/08/2020   Gestational thrombocytopenia, third trimester 07/20/2023   Postpartum hypertension 09/19/2023   Pyelonephritis    Tooth pain 08/05/2024   Twitch 12/05/2019   UTI (urinary tract infection)    Past Surgical History:  Procedure Laterality Date   CESAREAN SECTION N/A 09/15/2023   Procedure: CESAREAN SECTION;  Surgeon: Jessica Kieth BROCKS, MD;  Location: [facility] LD [facility];  Service: Obstetrics;  Laterality: N/A;   WISDOM TOOTH EXTRACTION     Patient Active Problem List   Diagnosis Date Noted   Moderate persistent asthma without complication 01/13/2023    PCP: Jessica Jenkins Jansky, MD   REFERRING PROVIDER: Orlean Suzen Lacks, NP  REFERRING DIAG: [phone] (ICD-10-CM) - Radiculopathy, lumbar region  Rationale for Evaluation and Treatment: Rehabilitation  THERAPY DIAG:  Other low back pain  Radiculopathy, lumbar region  Muscle weakness (generalized)  ONSET DATE: March 2024  SUBJECTIVE:                                                                                                                                                                                           SUBJECTIVE STATEMENT:   Patient returns to OPPT with increased symptoms as she has been more active at work.  Symptoms include BLE tingling as well as radicular symptoms to lateral aspect of legs.       Very pleasant out of 24 year old comes in today as a follow-up for her MRI. She continues to have back pain and left leg pain in her buttocks and hip. She denies any numbness or tingling. She did have an epidural when she was in labor. She had a spinal fluid leak after that. They  did a blood patch. The pain is pretty severe at times. She rates it a 6/10, constant, and achy. Activity makes it worse. Denies any change in bowel bladder habits.  PERTINENT HISTORY:  Relates a history of tight tendons as a child requiring PT.   PAIN:  Are you having pain? Yes: NPRS scale: 6/10 Pain location: Low back Pain description: ache  Aggravating factors: activity and prolonged weight bearing tasks including sitting Relieving factors: lying/sitting in a certain position  PRECAUTIONS: None  RED FLAGS: None   WEIGHT BEARING RESTRICTIONS: No  FALLS:  Has patient fallen in last 6 months? No  OCCUPATION: Jessica Hendrix  PLOF: Independent  PATIENT GOALS: To manage my back symptoms  NEXT MD VISIT: TBD  OBJECTIVE:  Note: Objective measures were completed at Evaluation unless otherwise noted.  DIAGNOSTIC FINDINGS:  IMPRESSION: 1. Tiny dorsal subdural fluid collection at the thoracolumbar junction and indistinct appearance of the cauda equina nerve roots following recent total spine Myelography, probably are incidental findings. No abnormal thecal sac or nerve root enhancement. No evidence of an epidural process. No evidence of spinal infection. 2. Stable isolated L4-L5 disc degeneration with no significant stenosis.     Electronically Signed   By: Jessica Hendrix M.D.   On: 12/03/2023 05:59  PATIENT SURVEYS:  Modified Oswestry:   Interpretation of scores: Score Category Description  0-20% Minimal Disability The patient  can cope with most living activities. Usually no treatment is indicated apart from advice on lifting, sitting and exercise  21-40% Moderate Disability The patient experiences more pain and difficulty with sitting, lifting and standing. Travel and social life are more difficult and they may be disabled from work. Personal care, sexual activity and sleeping are not grossly affected, and the patient can usually be managed by conservative means  41-60% Severe Disability Pain remains the main problem in this group, but activities of daily living are affected. These patients require a detailed investigation  61-80% Crippled Back pain impinges on all aspects of the patients life. Positive intervention is required  81-100% Bed-bound These patients are either bed-bound or exaggerating their symptoms  Jessica Hendrix Jessica Hendrix, et al. Surgery versus conservative management of stable thoracolumbar fracture: the PRESTO feasibility RCT. [place] ([place]): [employer]; 2021 Nov. Jessica Hendrix [employer] Assessment, No. 25.62.) Appendix 3, Oswestry Disability Index category descriptors. Available from: Findjewelers.cz  Minimally Clinically Important Difference (MCID) = 12.8%  MUSCLE LENGTH: Hamstrings: Right 45 deg; Left 45 deg Thomas test: PKB unremarkable  POSTURE: No Significant postural limitations  PALPATION: Globally tender to all paraspinal muscles  LUMBAR ROM:   AROM eval  Flexion 75%p!  Extension 75%p!  Right lateral flexion 75%  Left lateral flexion 75%  Right rotation 75%  Left rotation 50%   (Blank rows = not tested)  LOWER EXTREMITY ROM:   WFL throughout  Active  Right eval Left eval  Hip flexion    Hip extension    Hip abduction    Hip adduction    Hip internal rotation    Hip external rotation    Knee flexion    Knee extension    Ankle dorsiflexion    Ankle plantarflexion    Ankle inversion    Ankle eversion     (Blank rows = not  tested)  LOWER EXTREMITY MMT:  see 30s chair stand test  MMT Right eval Left eval  Hip flexion    Hip extension    Hip abduction    Hip adduction  Hip internal rotation    Hip external rotation    Knee flexion    Knee extension    Ankle dorsiflexion    Ankle plantarflexion    Ankle inversion    Ankle eversion     (Blank rows = not tested)  LUMBAR SPECIAL TESTS:  Straight leg raise test: limited to 45d B due to soft tissue restrictions and pain and Slump test: Negative  FUNCTIONAL TESTS:  30 seconds chair stand test 2 reps  GAIT: Distance walked: [id number] Assistive device utilized: None Level of assistance: Complete Independence Comments: slow and guarded  TREATMENT:     [facility] Adult PT Treatment:                                                DATE: 10/15/24 Therapeutic Exercise: Nustep L2 1 min Unable to tolerate additional time  Therapeutic Activity: Seated hamstring stretch 30s x2 B Supine QL stretch 30s x2 B Supine hip flexor stretch 30s x2 B Supine OH flexion 10/10 Supine march 10/10 S/L clams 10/10       [facility] Adult PT Treatment:                                                DATE: 10/04/24 Eval and HEP Self Care: Additional minutes spent for educating on updated Therapeutic Home Exercise Program as well as comparing current status to condition at start of symptoms. This included exercises focusing on stretching, strengthening, with focus on eccentric aspects. Long term goals include an improvement in range of motion, strength, endurance as well as avoiding reinjury. Patient's frequency would include in 1-2 times a day, 3-5 times a week for a duration of 6-12 weeks. Proper technique shown and discussed handout in great detail. All questions were discussed and addressed.                                                                                                              PATIENT EDUCATION:  Education details: Discussed eval findings, rehab rationale and POC  and patient is in agreement  Person educated: Patient Education method: Explanation and Handouts Education comprehension: verbalized understanding and needs further education  HOME EXERCISE PROGRAM: Access Code: [email or web address] URL: [email or web address] Date: 10/04/2024 Prepared by: Reyes Kohut  Exercises - Seated Table Hamstring Stretch  - 2-3 x daily - 5 x weekly - 3 sets - 10 reps - 30s hold - Seated Sciatic Tensioner  - 2-3 x daily - 5 x weekly - 3 sets - 10 reps - Supine Posterior Pelvic Tilt  - 2-3 x daily - 5 x weekly - 3 sets - 10 reps - 3s hold  ASSESSMENT:  CLINICAL IMPRESSION:  First f/u session consisted aerobic warm up as tolerated advancing to LE and lumbosacral stretching.  Due to elevated symptoms/soft tissue irritability no resistance added to tasks.  Marked hip flexor tightness noted.  Limited tolerance to stretching and core strengthening due to soft tissue symptoms.     Patient is a 24 y.o. female who was seen today for physical therapy evaluation and treatment for low back pain.  Symptoms are more global in nature extending from thoracic region to distal LE's.  Overall trunk mobility is [facility] but restricted by soft tissue discomfort/irritation.  Patient globally tender through paraspinal and LE and hip major muscle groups, similar to fibromyalgia symptoms.  Neural tension signs unremarkable to symptoms due to seem orthopedic in nature, possibly systemic.  Patient agreeable to trial of OPPT focused on stretching and mobility tasks.  OBJECTIVE IMPAIRMENTS: Abnormal gait, decreased activity tolerance, decreased endurance, decreased knowledge of condition, decreased mobility, difficulty walking, decreased ROM, decreased strength, increased fascial restrictions, impaired perceived functional ability, obesity, and pain.   ACTIVITY LIMITATIONS: carrying, lifting, bending, sitting, standing, squatting, sleeping, bed mobility, and caring for others  PERSONAL FACTORS:  Age, Fitness, Past/current experiences, and Time since onset of injury/illness/exacerbation are also affecting patient's functional outcome.   REHAB POTENTIAL: Good  CLINICAL DECISION MAKING: Stable/uncomplicated  EVALUATION COMPLEXITY: Moderate   GOALS: Goals reviewed with patient? No    SHORT TERM GOALS=LONG TERM GOALS: Target date: 11/29/2024    Patient will increase 30s chair stand reps from 2 to 4 with/without arms to demonstrate and improved functional ability with less pain/difficulty as well as reduce fall risk.  Baseline: 2 Goal status: INITIAL  2.  Patient will score at least 15/50 on ODI to signify clinically meaningful improvement in functional abilities.   Baseline: 25/50 Goal status: INITIAL  3.  Patient will acknowledge 4/10 pain at least once during episode of care  Baseline: 6/10 Goal status: INITIAL  4.  Patient to demonstrate independence in HEP  Baseline: [id number] Goal status: INITIAL   PLAN:  PT FREQUENCY: 1-2x/week  PT DURATION: 6 weeks  PLANNED INTERVENTIONS: 97110-Therapeutic exercises, 97530- Therapeutic activity, [id number]- Neuromuscular re-education, 97535- Self Care, [id number]- Manual therapy, and Patient/Family education.  PLAN FOR NEXT SESSION: HEP review and update, manual techniques as appropriate, aerobic tasks, ROM and flexibility activities, strengthening and PREs, TPDN, gait and balance training,aquatic therapy, modalities for pain and NMRE      Kynzleigh Bandel M Laramie Meissner, PT 10/15/2024, 4:14 PM  "

## 2024-10-15 ENCOUNTER — Ambulatory Visit

## 2024-10-15 DIAGNOSIS — M5459 Other low back pain: Secondary | ICD-10-CM

## 2024-10-15 DIAGNOSIS — M5416 Radiculopathy, lumbar region: Secondary | ICD-10-CM

## 2024-10-15 DIAGNOSIS — M6281 Muscle weakness (generalized): Secondary | ICD-10-CM

## 2024-10-17 ENCOUNTER — Ambulatory Visit: Payer: Self-pay | Admitting: Physician Assistant

## 2024-10-17 NOTE — Therapy (Signed)
 " OUTPATIENT PHYSICAL THERAPY TREATMENT NOTE   Patient Name: Jessica Hendrix MRN: [id number] DOB:[birth year], 24 y.o., female Today's Date: 10/17/2024  END OF SESSION:     Past Medical History:  Diagnosis Date   Allergy    Anemia    Anxiety    Asthma    Constipation    Depression    Depression, major, single episode, moderate (HCC) 12/05/2019   GAD (generalized anxiety disorder) 03/08/2020   Gestational thrombocytopenia, third trimester 07/20/2023   Postpartum hypertension 09/19/2023   Pyelonephritis    Tooth pain 08/05/2024   Twitch 12/05/2019   UTI (urinary tract infection)    Past Surgical History:  Procedure Laterality Date   CESAREAN SECTION N/A 09/15/2023   Procedure: CESAREAN SECTION;  Surgeon: Jessica Kieth BROCKS, MD;  Location: [facility] LD ORS;  Service: Obstetrics;  Laterality: N/A;   WISDOM TOOTH EXTRACTION     Patient Active Problem List   Diagnosis Date Noted   Moderate persistent asthma without complication 01/13/2023    PCP: Jessica Jenkins Jansky, MD   REFERRING PROVIDER: Orlean Suzen Lacks, NP  REFERRING DIAG: M54.16 (ICD-10-CM) - Radiculopathy, lumbar region  Rationale for Evaluation and Treatment: Rehabilitation  THERAPY DIAG:  No diagnosis found.  ONSET DATE: March 2024  SUBJECTIVE:                                                                                                                                                                                           SUBJECTIVE STATEMENT:  Patient returns to OPPT with increased symptoms as she has been more active at work.  Symptoms include BLE tingling as well as radicular symptoms to lateral aspect of legs.       Very pleasant out of 24 year old comes in today as a follow-up for her MRI. She continues to have back pain and left leg pain in her buttocks and hip. She denies any numbness or tingling. She did have an epidural when she was in labor. She had a spinal fluid leak after that. They did a  blood patch. The pain is pretty severe at times. She rates it a 6/10, constant, and achy. Activity makes it worse. Denies any change in bowel bladder habits.  PERTINENT HISTORY:  Relates a history of tight tendons as a child requiring PT.   PAIN:  Are you having pain? Yes: NPRS scale: 6/10 Pain location: Low back Pain description: ache  Aggravating factors: activity and prolonged weight bearing tasks including sitting Relieving factors: lying/sitting in a certain position  PRECAUTIONS: None  RED FLAGS: None   WEIGHT BEARING RESTRICTIONS: No  FALLS:  Has patient fallen in last  6 months? No  OCCUPATION: Jessica Hendrix  PLOF: Independent  PATIENT GOALS: To manage my back symptoms  NEXT MD VISIT: TBD  OBJECTIVE:  Note: Objective measures were completed at Evaluation unless otherwise noted.  DIAGNOSTIC FINDINGS:  IMPRESSION: 1. Tiny dorsal subdural fluid collection at the thoracolumbar junction and indistinct appearance of the cauda equina nerve roots following recent total spine Myelography, probably are incidental findings. No abnormal thecal sac or nerve root enhancement. No evidence of an epidural process. No evidence of spinal infection. 2. Stable isolated L4-L5 disc degeneration with no significant stenosis.     Electronically Signed   By: Jessica Hendrix M.D.   On: 12/03/2023 05:59  PATIENT SURVEYS:  Modified Oswestry:   Interpretation of scores: Score Category Description  0-20% Minimal Disability The patient can cope with most living activities. Usually no treatment is indicated apart from advice on lifting, sitting and exercise  21-40% Moderate Disability The patient experiences more pain and difficulty with sitting, lifting and standing. Travel and social life are more difficult and they may be disabled from work. Personal care, sexual activity and sleeping are not grossly affected, and the patient can usually be managed by conservative means  41-60% Severe  Disability Pain remains the main problem in this group, but activities of daily living are affected. These patients require a detailed investigation  61-80% Crippled Back pain impinges on all aspects of the patients life. Positive intervention is required  81-100% Bed-bound These patients are either bed-bound or exaggerating their symptoms  Bluford FORBES [name], et al. Surgery versus conservative management of stable thoracolumbar fracture: the PRESTO feasibility RCT. [place] ([place]): [employer]; 2021 Nov. [facility] Technology Assessment, No. 25.62.) Appendix 3, Oswestry Disability Index category descriptors. Available from: Findjewelers.cz  Minimally Clinically Important Difference (MCID) = 12.8%  MUSCLE LENGTH: Hamstrings: Right 45 deg; Left 45 deg Thomas test: PKB unremarkable  POSTURE: No Significant postural limitations  PALPATION: Globally tender to all paraspinal muscles  LUMBAR ROM:   AROM eval  Flexion 75%p!  Extension 75%p!  Right lateral flexion 75%  Left lateral flexion 75%  Right rotation 75%  Left rotation 50%   (Blank rows = not tested)  LOWER EXTREMITY ROM:   WFL throughout  Active  Right eval Left eval  Hip flexion    Hip extension    Hip abduction    Hip adduction    Hip internal rotation    Hip external rotation    Knee flexion    Knee extension    Ankle dorsiflexion    Ankle plantarflexion    Ankle inversion    Ankle eversion     (Blank rows = not tested)  LOWER EXTREMITY MMT:  see 30s chair stand test  MMT Right eval Left eval  Hip flexion    Hip extension    Hip abduction    Hip adduction    Hip internal rotation    Hip external rotation    Knee flexion    Knee extension    Ankle dorsiflexion    Ankle plantarflexion    Ankle inversion    Ankle eversion     (Blank rows = not tested)  LUMBAR SPECIAL TESTS:  Straight leg raise test: limited to 45d B due to soft tissue restrictions  and pain and Slump test: Negative  FUNCTIONAL TESTS:  30 seconds chair stand test 2 reps  GAIT: Distance walked: 28ftx2 Assistive device utilized: None Level of assistance: Complete Independence Comments: slow and guarded  TREATMENT:     [facility] Adult PT Treatment:                                                DATE: 10/15/24 Therapeutic Exercise: Nustep L2 1 min Unable to tolerate additional time  Therapeutic Activity: Seated hamstring stretch 30s x2 B Supine QL stretch 30s x2 B Supine hip flexor stretch 30s x2 B Supine OH flexion 10/10 Supine march 10/10 S/L clams 10/10       [facility] Adult PT Treatment:                                                DATE: 10/04/24 Eval and HEP Self Care: Additional minutes spent for educating on updated Therapeutic Home Exercise Program as well as comparing current status to condition at start of symptoms. This included exercises focusing on stretching, strengthening, with focus on eccentric aspects. Long term goals include an improvement in range of motion, strength, endurance as well as avoiding reinjury. Patient's frequency would include in 1-2 times a day, 3-5 times a week for a duration of 6-12 weeks. Proper technique shown and discussed handout in great detail. All questions were discussed and addressed.                                                                                                              PATIENT EDUCATION:  Education details: Discussed eval findings, rehab rationale and POC and patient is in agreement  Person educated: Patient Education method: Explanation and Handouts Education comprehension: verbalized understanding and needs further education  HOME EXERCISE PROGRAM: Access Code: [email or web address] URL: [email or web address] Date: 10/04/2024 Prepared by: Reyes Kohut  Exercises - Seated Table Hamstring Stretch  - 2-3 x daily - 5 x weekly - 3 sets - 10 reps - 30s hold - Seated Sciatic Tensioner  - 2-3 x daily  - 5 x weekly - 3 sets - 10 reps - Supine Posterior Pelvic Tilt  - 2-3 x daily - 5 x weekly - 3 sets - 10 reps - 3s hold  ASSESSMENT:  CLINICAL IMPRESSION:  First f/u session consisted aerobic warm up as tolerated advancing to LE and lumbosacral stretching.  Due to elevated symptoms/soft tissue irritability no resistance added to tasks.  Marked hip flexor tightness noted.  Limited tolerance to stretching and core strengthening due to soft tissue symptoms.     Patient is a 24 y.o. female who was seen today for physical therapy evaluation and treatment for low back pain.  Symptoms are more global in nature extending from thoracic region to distal LE's.  Overall trunk mobility is [facility] but restricted by soft tissue discomfort/irritation.  Patient globally tender through paraspinal and LE and hip major muscle groups, similar to fibromyalgia symptoms.  Neural tension signs  unremarkable to symptoms due to seem orthopedic in nature, possibly systemic.  Patient agreeable to trial of OPPT focused on stretching and mobility tasks.  OBJECTIVE IMPAIRMENTS: Abnormal gait, decreased activity tolerance, decreased endurance, decreased knowledge of condition, decreased mobility, difficulty walking, decreased ROM, decreased strength, increased fascial restrictions, impaired perceived functional ability, obesity, and pain.   ACTIVITY LIMITATIONS: carrying, lifting, bending, sitting, standing, squatting, sleeping, bed mobility, and caring for others  PERSONAL FACTORS: Age, Fitness, Past/current experiences, and Time since onset of injury/illness/exacerbation are also affecting patient's functional outcome.   REHAB POTENTIAL: Good  CLINICAL DECISION MAKING: Stable/uncomplicated  EVALUATION COMPLEXITY: Moderate   GOALS: Goals reviewed with patient? No    SHORT TERM GOALS=LONG TERM GOALS: Target date: 11/29/2024    Patient will increase 30s chair stand reps from 2 to 4 with/without arms to demonstrate and  improved functional ability with less pain/difficulty as well as reduce fall risk.  Baseline: 2 Goal status: INITIAL  2.  Patient will score at least 15/50 on ODI to signify clinically meaningful improvement in functional abilities.   Baseline: 25/50 Goal status: INITIAL  3.  Patient will acknowledge 4/10 pain at least once during episode of care  Baseline: 6/10 Goal status: INITIAL  4.  Patient to demonstrate independence in HEP  Baseline: [id number] Goal status: INITIAL   PLAN:  PT FREQUENCY: 1-2x/week  PT DURATION: 6 weeks  PLANNED INTERVENTIONS: 97110-Therapeutic exercises, 97530- Therapeutic activity, [id number]- Neuromuscular re-education, 97535- Self Care, [id number]- Manual therapy, and Patient/Family education.  PLAN FOR NEXT SESSION: HEP review and update, manual techniques as appropriate, aerobic tasks, ROM and flexibility activities, strengthening and PREs, TPDN, gait and balance training,aquatic therapy, modalities for pain and NMRE      Maizee Reinhold M Celestine Bougie, PT 10/17/2024, 12:59 PM  "

## 2024-10-18 ENCOUNTER — Ambulatory Visit

## 2024-10-18 DIAGNOSIS — M5459 Other low back pain: Secondary | ICD-10-CM | POA: Diagnosis not present

## 2024-10-18 DIAGNOSIS — M5416 Radiculopathy, lumbar region: Secondary | ICD-10-CM

## 2024-10-18 DIAGNOSIS — M6281 Muscle weakness (generalized): Secondary | ICD-10-CM

## 2024-10-18 NOTE — Therapy (Signed)
 " OUTPATIENT PHYSICAL THERAPY TREATMENT NOTE   Patient Name: Jessica Hendrix MRN: [id number] DOB:[birth year], 24 y.o., female Today's Date: 10/18/2024  END OF SESSION:  PT End of Session - 10/18/24 1306     Visit Number 3    Number of Visits 12    Date for Recertification  12/02/24    Authorization Type [facility] [employer]    Authorization Time Period after 25 visits    PT Start Time 1302    PT Stop Time 1340    PT Time Calculation (min) 38 min    Activity Tolerance Patient tolerated treatment well    Behavior During Therapy [facility] for tasks assessed/performed            Past Medical History:  Diagnosis Date   Allergy    Anemia    Anxiety    Asthma    Constipation    Depression    Depression, major, single episode, moderate (HCC) 12/05/2019   GAD (generalized anxiety disorder) 03/08/2020   Gestational thrombocytopenia, third trimester 07/20/2023   Postpartum hypertension 09/19/2023   Pyelonephritis    Tooth pain 08/05/2024   Twitch 12/05/2019   UTI (urinary tract infection)    Past Surgical History:  Procedure Laterality Date   CESAREAN SECTION N/A 09/15/2023   Procedure: CESAREAN SECTION;  Surgeon: Erik Kieth BROCKS, MD;  Location: [facility] LD ORS;  Service: Obstetrics;  Laterality: N/A;   WISDOM TOOTH EXTRACTION     Patient Active Problem List   Diagnosis Date Noted   Moderate persistent asthma without complication 01/13/2023    PCP: Wendolyn Jenkins Jansky, MD   REFERRING PROVIDER: Orlean Suzen Lacks, NP  REFERRING DIAG: [phone] (ICD-10-CM) - Radiculopathy, lumbar region  Rationale for Evaluation and Treatment: Rehabilitation  THERAPY DIAG:  Other low back pain  Radiculopathy, lumbar region  Muscle weakness (generalized)  ONSET DATE: March 2024  SUBJECTIVE:                                                                                                                                                                                           SUBJECTIVE STATEMENT:    Pt reports feeling fine today.     Very pleasant out of 24 year old comes in today as a follow-up for her MRI. She continues to have back pain and left leg pain in her buttocks and hip. She denies any numbness or tingling. She did have an epidural when she was in labor. She had a spinal fluid leak after that. They did a blood patch. The pain is pretty severe at times. She rates it a 6/10, constant, and achy. Activity makes it worse. Denies any  change in bowel bladder habits.  PERTINENT HISTORY:  Relates a history of tight tendons as a child requiring PT.   PAIN:  Are you having pain? Yes: NPRS scale: 6/10 Pain location: Low back Pain description: ache  Aggravating factors: activity and prolonged weight bearing tasks including sitting Relieving factors: lying/sitting in a certain position  PRECAUTIONS: None  RED FLAGS: None   WEIGHT BEARING RESTRICTIONS: No  FALLS:  Has patient fallen in last 6 months? No  OCCUPATION: [name]  PLOF: Independent  PATIENT GOALS: To manage my back symptoms  NEXT MD VISIT: TBD  OBJECTIVE:  Note: Objective measures were completed at Evaluation unless otherwise noted.  DIAGNOSTIC FINDINGS:  IMPRESSION: 1. Tiny dorsal subdural fluid collection at the thoracolumbar junction and indistinct appearance of the cauda equina nerve roots following recent total spine Myelography, probably are incidental findings. No abnormal thecal sac or nerve root enhancement. No evidence of an epidural process. No evidence of spinal infection. 2. Stable isolated L4-L5 disc degeneration with no significant stenosis.     Electronically Signed   By: VEAR Hurst M.D.   On: 12/03/2023 05:59  PATIENT SURVEYS:  Modified Oswestry:   Interpretation of scores: Score Category Description  0-20% Minimal Disability The patient can cope with most living activities. Usually no treatment is indicated apart from advice on lifting, sitting and exercise  21-40% Moderate  Disability The patient experiences more pain and difficulty with sitting, lifting and standing. Travel and social life are more difficult and they may be disabled from work. Personal care, sexual activity and sleeping are not grossly affected, and the patient can usually be managed by conservative means  41-60% Severe Disability Pain remains the main problem in this group, but activities of daily living are affected. These patients require a detailed investigation  61-80% Crippled Back pain impinges on all aspects of the patients life. Positive intervention is required  81-100% Bed-bound These patients are either bed-bound or exaggerating their symptoms  Bluford FORBES [name], et al. Surgery versus conservative management of stable thoracolumbar fracture: the PRESTO feasibility RCT. [place] ([place]): [employer]; 2021 Nov. [facility] Technology Assessment, No. 25.62.) Appendix 3, Oswestry Disability Index category descriptors. Available from: Findjewelers.cz  Minimally Clinically Important Difference (MCID) = 12.8%  MUSCLE LENGTH: Hamstrings: Right 45 deg; Left 45 deg Thomas test: PKB unremarkable  POSTURE: No Significant postural limitations  PALPATION: Globally tender to all paraspinal muscles  LUMBAR ROM:   AROM eval  Flexion 75%p!  Extension 75%p!  Right lateral flexion 75%  Left lateral flexion 75%  Right rotation 75%  Left rotation 50%   (Blank rows = not tested)  LOWER EXTREMITY ROM:   WFL throughout  Active  Right eval Left eval  Hip flexion    Hip extension    Hip abduction    Hip adduction    Hip internal rotation    Hip external rotation    Knee flexion    Knee extension    Ankle dorsiflexion    Ankle plantarflexion    Ankle inversion    Ankle eversion     (Blank rows = not tested)  LOWER EXTREMITY MMT:  see 30s chair stand test  MMT Right eval Left eval  Hip flexion    Hip extension    Hip abduction     Hip adduction    Hip internal rotation    Hip external rotation    Knee flexion    Knee extension    Ankle  dorsiflexion    Ankle plantarflexion    Ankle inversion    Ankle eversion     (Blank rows = not tested)  LUMBAR SPECIAL TESTS:  Straight leg raise test: limited to 45d B due to soft tissue restrictions and pain and Slump test: Negative  FUNCTIONAL TESTS:  30 seconds chair stand test 2 reps  GAIT: Distance walked: 75ftx2 Assistive device utilized: None Level of assistance: Complete Independence Comments: slow and guarded  TREATMENT:     [facility] Adult PT Treatment:                                                DATE: 10/18/24 Therapeutic Exercise: Nustep L2 6 min   Neuro Re-Ed: LAQ 3# x 10 B, x 10 with toe pointed on L only  Seated sciatic nerve glide x 15 B  Seated core brace x 3 mins STS with 8lb DB x 10  STS with core brace x 10  Supine core brace with march into leg extended x 7 B  LTR 5x5-10 hold B        [facility] Adult PT Treatment:                                                DATE: 10/04/24 Eval and HEP Self Care: Additional minutes spent for educating on updated Therapeutic Home Exercise Program as well as comparing current status to condition at start of symptoms. This included exercises focusing on stretching, strengthening, with focus on eccentric aspects. Long term goals include an improvement in range of motion, strength, endurance as well as avoiding reinjury. Patient's frequency would include in 1-2 times a day, 3-5 times a week for a duration of 6-12 weeks. Proper technique shown and discussed handout in great detail. All questions were discussed and addressed.                                                                                                              PATIENT EDUCATION:  Education details: Discussed eval findings, rehab rationale and POC and patient is in agreement  Person educated: Patient Education method: Explanation and  Handouts Education comprehension: verbalized understanding and needs further education  HOME EXERCISE PROGRAM: Access Code: [email or web address] URL: [email or web address] Date: 10/04/2024 Prepared by: Jeffrey Ziemba  Exercises - Seated Table Hamstring Stretch  - 2-3 x daily - 5 x weekly - 3 sets - 10 reps - 30s hold - Seated Sciatic Tensioner  - 2-3 x daily - 5 x weekly - 3 sets - 10 reps - Supine Posterior Pelvic Tilt  - 2-3 x daily - 5 x weekly - 3 sets - 10 reps - 3s hold  Access Code: [email or web address] URL: [email or web address] Date: 10/18/2024 Prepared by: Marijo Berber  Exercises - Multifidus Knee Lift  Off With Transverse Abdominis and Pelvic Floor Contraction  - 1 x daily - 7 x weekly - 3 sets - 10 reps - 5sec hold - Supine Active Straight Leg Raise  - 1 x daily - 7 x weekly - 1-2 sets - 10 reps - Supine 90/90 Sciatic Nerve Glide with Knee Flexion/Extension  - 1 x daily - 7 x weekly - 1-2 sets - 10 reps  ASSESSMENT:  CLINICAL IMPRESSION:  Pt able to tolerate more exercises today with focus on core stability and sciatic mobility. Pt unable to hold core brace through STS, regressed to supine. Updated HEP to include more core stability and a sciatic nerve glide variation. The pt will benefit from skilled physical therapy to decrease pain and increase function.      Patient is a 24 y.o. female who was seen today for physical therapy evaluation and treatment for low back pain.  Symptoms are more global in nature extending from thoracic region to distal LE's.  Overall trunk mobility is [facility] but restricted by soft tissue discomfort/irritation.  Patient globally tender through paraspinal and LE and hip major muscle groups, similar to fibromyalgia symptoms.  Neural tension signs unremarkable to symptoms due to seem orthopedic in nature, possibly systemic.  Patient agreeable to trial of OPPT focused on stretching and mobility tasks.  OBJECTIVE IMPAIRMENTS: Abnormal gait,  decreased activity tolerance, decreased endurance, decreased knowledge of condition, decreased mobility, difficulty walking, decreased ROM, decreased strength, increased fascial restrictions, impaired perceived functional ability, obesity, and pain.   ACTIVITY LIMITATIONS: carrying, lifting, bending, sitting, standing, squatting, sleeping, bed mobility, and caring for others  PERSONAL FACTORS: Age, Fitness, Past/current experiences, and Time since onset of injury/illness/exacerbation are also affecting patient's functional outcome.   REHAB POTENTIAL: Good  CLINICAL DECISION MAKING: Stable/uncomplicated  EVALUATION COMPLEXITY: Moderate   GOALS: Goals reviewed with patient? No    SHORT TERM GOALS=LONG TERM GOALS: Target date: 11/29/2024    Patient will increase 30s chair stand reps from 2 to 4 with/without arms to demonstrate and improved functional ability with less pain/difficulty as well as reduce fall risk.  Baseline: 2 Goal status: INITIAL  2.  Patient will score at least 15/50 on ODI to signify clinically meaningful improvement in functional abilities.   Baseline: 25/50 Goal status: INITIAL  3.  Patient will acknowledge 4/10 pain at least once during episode of care  Baseline: 6/10 Goal status: INITIAL  4.  Patient to demonstrate independence in HEP  Baseline: [id number] Goal status: INITIAL   PLAN:  PT FREQUENCY: 1-2x/week  PT DURATION: 6 weeks  PLANNED INTERVENTIONS: 97110-Therapeutic exercises, 97530- Therapeutic activity, [id number]- Neuromuscular re-education, 97535- Self Care, [id number]- Manual therapy, and Patient/Family education.  PLAN FOR NEXT SESSION: HEP review and update, manual techniques as appropriate, aerobic tasks, ROM and flexibility activities, strengthening and PREs, TPDN, gait and balance training,aquatic therapy, modalities for pain and NMRE      Marijo DELENA Berber, PT 10/18/2024, 1:56 PM  "

## 2024-10-22 ENCOUNTER — Ambulatory Visit

## 2024-10-22 DIAGNOSIS — M5459 Other low back pain: Secondary | ICD-10-CM

## 2024-10-22 DIAGNOSIS — M6281 Muscle weakness (generalized): Secondary | ICD-10-CM

## 2024-10-22 DIAGNOSIS — M5416 Radiculopathy, lumbar region: Secondary | ICD-10-CM

## 2024-10-22 NOTE — Therapy (Signed)
 " OUTPATIENT PHYSICAL THERAPY TREATMENT NOTE   Patient Name: Ensley Blas MRN: [id number] DOB:2001/05/02, 24 y.o., female Today's Date: 10/22/2024  END OF SESSION:  PT End of Session - 10/22/24 1529     Visit Number 4    Number of Visits 12    Date for Recertification  12/02/24    Authorization Type [facility] [employer]    Authorization Time Period after 25 visits    PT Start Time 1530    PT Stop Time 1610    PT Time Calculation (min) 40 min    Activity Tolerance Patient tolerated treatment well    Behavior During Therapy [facility] for tasks assessed/performed            Past Medical History:  Diagnosis Date   Allergy    Anemia    Anxiety    Asthma    Constipation    Depression    Depression, major, single episode, moderate (HCC) 12/05/2019   GAD (generalized anxiety disorder) 03/08/2020   Gestational thrombocytopenia, third trimester 07/20/2023   Postpartum hypertension 09/19/2023   Pyelonephritis    Tooth pain 08/05/2024   Twitch 12/05/2019   UTI (urinary tract infection)    Past Surgical History:  Procedure Laterality Date   CESAREAN SECTION N/A 09/15/2023   Procedure: CESAREAN SECTION;  Surgeon: Erik Kieth BROCKS, MD;  Location: [facility] LD ORS;  Service: Obstetrics;  Laterality: N/A;   WISDOM TOOTH EXTRACTION     Patient Active Problem List   Diagnosis Date Noted   Moderate persistent asthma without complication 01/13/2023    PCP: Wendolyn Jenkins Jansky, MD   REFERRING PROVIDER: Orlean Suzen Lacks, NP  REFERRING DIAG: [phone] (ICD-10-CM) - Radiculopathy, lumbar region  Rationale for Evaluation and Treatment: Rehabilitation  THERAPY DIAG:  Other low back pain  Radiculopathy, lumbar region  Muscle weakness (generalized)  ONSET DATE: March 2024  SUBJECTIVE:                                                                                                                                                                                           SUBJECTIVE STATEMENT:   Continues with symptoms which had decreased slightly.  Following PT sessions, symptoms improved for about an hour.  Awakes with stiffness/soreness which resolves after an hour of activity.  Prolonged standing/walking at work aggravate symptoms as well.     Very pleasant out of 24 year old comes in today as a follow-up for her MRI. She continues to have back pain and left leg pain in her buttocks and hip. She denies any numbness or tingling. She did have an epidural when she was in labor. She had a  spinal fluid leak after that. They did a blood patch. The pain is pretty severe at times. She rates it a 6/10, constant, and achy. Activity makes it worse. Denies any change in bowel bladder habits.  PERTINENT HISTORY:  Relates a history of tight tendons as a child requiring PT.   PAIN:  Are you having pain? Yes: NPRS scale: 6/10 Pain location: Low back Pain description: ache  Aggravating factors: activity and prolonged weight bearing tasks including sitting Relieving factors: lying/sitting in a certain position  PRECAUTIONS: None  RED FLAGS: None   WEIGHT BEARING RESTRICTIONS: No  FALLS:  Has patient fallen in last 6 months? No  OCCUPATION: [name]  PLOF: Independent  PATIENT GOALS: To manage my back symptoms  NEXT MD VISIT: TBD  OBJECTIVE:  Note: Objective measures were completed at Evaluation unless otherwise noted.  DIAGNOSTIC FINDINGS:  IMPRESSION: 1. Tiny dorsal subdural fluid collection at the thoracolumbar junction and indistinct appearance of the cauda equina nerve roots following recent total spine Myelography, probably are incidental findings. No abnormal thecal sac or nerve root enhancement. No evidence of an epidural process. No evidence of spinal infection. 2. Stable isolated L4-L5 disc degeneration with no significant stenosis.     Electronically Signed   By: VEAR Hurst M.D.   On: 12/03/2023 05:59  PATIENT SURVEYS:  Modified Oswestry:    Interpretation of scores: Score Category Description  0-20% Minimal Disability The patient can cope with most living activities. Usually no treatment is indicated apart from advice on lifting, sitting and exercise  21-40% Moderate Disability The patient experiences more pain and difficulty with sitting, lifting and standing. Travel and social life are more difficult and they may be disabled from work. Personal care, sexual activity and sleeping are not grossly affected, and the patient can usually be managed by conservative means  41-60% Severe Disability Pain remains the main problem in this group, but activities of daily living are affected. These patients require a detailed investigation  61-80% Crippled Back pain impinges on all aspects of the patients life. Positive intervention is required  81-100% Bed-bound These patients are either bed-bound or exaggerating their symptoms  Bluford FORBES [name], et al. Surgery versus conservative management of stable thoracolumbar fracture: the PRESTO feasibility RCT. [place] ([place]): [employer]; 2021 Nov. [facility] Technology Assessment, No. 25.62.) Appendix 3, Oswestry Disability Index category descriptors. Available from: Findjewelers.cz  Minimally Clinically Important Difference (MCID) = 12.8%  MUSCLE LENGTH: Hamstrings: Right 45 deg; Left 45 deg Thomas test: PKB unremarkable  POSTURE: No Significant postural limitations  PALPATION: Globally tender to all paraspinal muscles  LUMBAR ROM:   AROM eval  Flexion 75%p!  Extension 75%p!  Right lateral flexion 75%  Left lateral flexion 75%  Right rotation 75%  Left rotation 50%   (Blank rows = not tested)  LOWER EXTREMITY ROM:   WFL throughout  Active  Right eval Left eval  Hip flexion    Hip extension    Hip abduction    Hip adduction    Hip internal rotation    Hip external rotation    Knee flexion    Knee extension    Ankle  dorsiflexion    Ankle plantarflexion    Ankle inversion    Ankle eversion     (Blank rows = not tested)  LOWER EXTREMITY MMT:  see 30s chair stand test  MMT Right eval Left eval  Hip flexion    Hip extension    Hip abduction  Hip adduction    Hip internal rotation    Hip external rotation    Knee flexion    Knee extension    Ankle dorsiflexion    Ankle plantarflexion    Ankle inversion    Ankle eversion     (Blank rows = not tested)  LUMBAR SPECIAL TESTS:  Straight leg raise test: limited to 45d B due to soft tissue restrictions and pain and Slump test: Negative  FUNCTIONAL TESTS:  30 seconds chair stand test 2 reps  GAIT: Distance walked: 7ftx2 Assistive device utilized: None Level of assistance: Complete Independence Comments: slow and guarded  TREATMENT:     [facility] Adult PT Treatment:                                                DATE: 10/22/24 Therapeutic Exercise: Nustep L2 8 min   Therapeutic Activity: Seated hamstring stretch 30s x2 B (with towel to facilitate DF) Supine piriformis stretch (fig 4) 30s x2 B Supine hip flexor stretch 30s x2 B Slant board stretch Gastroc/Soleus 30s x2 B  [facility] Adult PT Treatment:                                                DATE: 10/18/24 Therapeutic Exercise: Nustep L2 6 min   Neuro Re-Ed: LAQ 3# x 10 B, x 10 with toe pointed on L only  Seated sciatic nerve glide x 15 B  Seated core brace x 3 mins STS with 8lb DB x 10  STS with core brace x 10  Supine core brace with march into leg extended x 7 B  LTR 5x5-10 hold B   [facility] Adult PT Treatment:                                                DATE: 10/15/24 Therapeutic Exercise: Nustep L2 1 min Unable to tolerate additional time   Therapeutic Activity: Seated hamstring stretch 30s x2 B Supine QL stretch 30s x2 B Supine hip flexor stretch 30s x2 B Supine OH flexion 10/10 Supine march 10/10 S/L clams 10/10      [facility] Adult PT Treatment:                                                 DATE: 10/04/24 Eval and HEP Self Care: Additional minutes spent for educating on updated Therapeutic Home Exercise Program as well as comparing current status to condition at start of symptoms. This included exercises focusing on stretching, strengthening, with focus on eccentric aspects. Long term goals include an improvement in range of motion, strength, endurance as well as avoiding reinjury. Patient's frequency would include in 1-2 times a day, 3-5 times a week for a duration of 6-12 weeks. Proper technique shown and discussed handout in great detail. All questions were discussed and addressed.  PATIENT EDUCATION:  Education details: Discussed eval findings, rehab rationale and POC and patient is in agreement  Person educated: Patient Education method: Explanation and Handouts Education comprehension: verbalized understanding and needs further education  HOME EXERCISE PROGRAM: Access Code: [email or web address] URL: [email or web address] Date: 10/04/2024 Prepared by: Elianis Fischbach  Exercises - Seated Table Hamstring Stretch  - 2-3 x daily - 5 x weekly - 3 sets - 10 reps - 30s hold - Seated Sciatic Tensioner  - 2-3 x daily - 5 x weekly - 3 sets - 10 reps - Supine Posterior Pelvic Tilt  - 2-3 x daily - 5 x weekly - 3 sets - 10 reps - 3s hold  Access Code: [email or web address] URL: [email or web address] Date: 10/18/2024 Prepared by: Marijo Berber  Exercises - Multifidus Knee Lift Off With Transverse Abdominis and Pelvic Floor Contraction  - 1 x daily - 7 x weekly - 3 sets - 10 reps - 5sec hold - Supine Active Straight Leg Raise  - 1 x daily - 7 x weekly - 1-2 sets - 10 reps - Supine 90/90 Sciatic Nerve Glide with Knee Flexion/Extension  - 1 x daily - 7 x weekly - 1-2 sets - 10 reps  ASSESSMENT:  CLINICAL IMPRESSION:  Able to increase time on aerobic  component to 8 min.  Added additional gastroc/soleus stretch which reproduced N&T in feet.  Modified LE stretching to focus more on myofascial component.   Patient is a 24 y.o. female who was seen today for physical therapy evaluation and treatment for low back pain.  Symptoms are more global in nature extending from thoracic region to distal LE's.  Overall trunk mobility is [facility] but restricted by soft tissue discomfort/irritation.  Patient globally tender through paraspinal and LE and hip major muscle groups, similar to fibromyalgia symptoms.  Neural tension signs unremarkable to symptoms due to seem orthopedic in nature, possibly systemic.  Patient agreeable to trial of OPPT focused on stretching and mobility tasks.  OBJECTIVE IMPAIRMENTS: Abnormal gait, decreased activity tolerance, decreased endurance, decreased knowledge of condition, decreased mobility, difficulty walking, decreased ROM, decreased strength, increased fascial restrictions, impaired perceived functional ability, obesity, and pain.   ACTIVITY LIMITATIONS: carrying, lifting, bending, sitting, standing, squatting, sleeping, bed mobility, and caring for others  PERSONAL FACTORS: Age, Fitness, Past/current experiences, and Time since onset of injury/illness/exacerbation are also affecting patient's functional outcome.   REHAB POTENTIAL: Good  CLINICAL DECISION MAKING: Stable/uncomplicated  EVALUATION COMPLEXITY: Moderate   GOALS: Goals reviewed with patient? No    SHORT TERM GOALS=LONG TERM GOALS: Target date: 11/29/2024    Patient will increase 30s chair stand reps from 2 to 4 with/without arms to demonstrate and improved functional ability with less pain/difficulty as well as reduce fall risk.  Baseline: 2 Goal status: INITIAL  2.  Patient will score at least 15/50 on ODI to signify clinically meaningful improvement in functional abilities.   Baseline: 25/50 Goal status: INITIAL  3.  Patient will acknowledge 4/10 pain  at least once during episode of care  Baseline: 6/10 Goal status: INITIAL  4.  Patient to demonstrate independence in HEP  Baseline: [id number] Goal status: INITIAL   PLAN:  PT FREQUENCY: 1-2x/week  PT DURATION: 6 weeks  PLANNED INTERVENTIONS: 97110-Therapeutic exercises, 97530- Therapeutic activity, [id number]- Neuromuscular re-education, 97535- Self Care, [id number]- Manual therapy, and Patient/Family education.  PLAN FOR NEXT SESSION: HEP review and update, manual techniques as appropriate, aerobic tasks, ROM and flexibility activities, strengthening and PREs, TPDN, gait and balance training,aquatic therapy, modalities for pain and  NMRE      Isidoro Santillana M Qunisha Bryk, PT 10/22/2024, 4:11 PM  "

## 2024-10-23 NOTE — Therapy (Signed)
 " OUTPATIENT PHYSICAL THERAPY TREATMENT NOTE   Patient Name: Sheryle Vice MRN: [id number] DOB:[birth year], 24 y.o., female Today's Date: 10/25/2024  END OF SESSION:  PT End of Session - 10/25/24 1215     Visit Number 5    Number of Visits 12    Date for Recertification  12/02/24    Authorization Type [facility] [employer]    Authorization Time Period after 25 visits    PT Start Time 1215    PT Stop Time 1255    PT Time Calculation (min) 40 min    Activity Tolerance Patient tolerated treatment well    Behavior During Therapy [facility] for tasks assessed/performed             Past Medical History:  Diagnosis Date   Allergy    Anemia    Anxiety    Asthma    Constipation    Depression    Depression, major, single episode, moderate (HCC) 12/05/2019   GAD (generalized anxiety disorder) 03/08/2020   Gestational thrombocytopenia, third trimester 07/20/2023   Postpartum hypertension 09/19/2023   Pyelonephritis    Tooth pain 08/05/2024   Twitch 12/05/2019   UTI (urinary tract infection)    Past Surgical History:  Procedure Laterality Date   CESAREAN SECTION N/A 09/15/2023   Procedure: CESAREAN SECTION;  Surgeon: Erik Kieth BROCKS, MD;  Location: [facility] LD ORS;  Service: Obstetrics;  Laterality: N/A;   WISDOM TOOTH EXTRACTION     Patient Active Problem List   Diagnosis Date Noted   Moderate persistent asthma without complication 01/13/2023    PCP: Wendolyn Jenkins Jansky, MD   REFERRING PROVIDER: Orlean Suzen Lacks, NP  REFERRING DIAG: [phone] (ICD-10-CM) - Radiculopathy, lumbar region  Rationale for Evaluation and Treatment: Rehabilitation  THERAPY DIAG:  Other low back pain  Radiculopathy, lumbar region  Muscle weakness (generalized)  ONSET DATE: March 2024  SUBJECTIVE:                                                                                                                                                                                           SUBJECTIVE  STATEMENT:  Returns with less discomfort but sore from last session and stretches.     Very pleasant out of 24 year old comes in today as a follow-up for her MRI. She continues to have back pain and left leg pain in her buttocks and hip. She denies any numbness or tingling. She did have an epidural when she was in labor. She had a spinal fluid leak after that. They did a blood patch. The pain is pretty severe at times. She rates it a 6/10, constant, and achy.  Activity makes it worse. Denies any change in bowel bladder habits.  PERTINENT HISTORY:  Relates a history of tight tendons as a child requiring PT.   PAIN:  Are you having pain? Yes: NPRS scale: 6/10 Pain location: Low back Pain description: ache  Aggravating factors: activity and prolonged weight bearing tasks including sitting Relieving factors: lying/sitting in a certain position  PRECAUTIONS: None  RED FLAGS: None   WEIGHT BEARING RESTRICTIONS: No  FALLS:  Has patient fallen in last 6 months? No  OCCUPATION: [name]  PLOF: Independent  PATIENT GOALS: To manage my back symptoms  NEXT MD VISIT: TBD  OBJECTIVE:  Note: Objective measures were completed at Evaluation unless otherwise noted.  DIAGNOSTIC FINDINGS:  IMPRESSION: 1. Tiny dorsal subdural fluid collection at the thoracolumbar junction and indistinct appearance of the cauda equina nerve roots following recent total spine Myelography, probably are incidental findings. No abnormal thecal sac or nerve root enhancement. No evidence of an epidural process. No evidence of spinal infection. 2. Stable isolated L4-L5 disc degeneration with no significant stenosis.     Electronically Signed   By: VEAR Hurst M.D.   On: 12/03/2023 05:59  PATIENT SURVEYS:  Modified Oswestry:   Interpretation of scores: Score Category Description  0-20% Minimal Disability The patient can cope with most living activities. Usually no treatment is indicated apart from advice  on lifting, sitting and exercise  21-40% Moderate Disability The patient experiences more pain and difficulty with sitting, lifting and standing. Travel and social life are more difficult and they may be disabled from work. Personal care, sexual activity and sleeping are not grossly affected, and the patient can usually be managed by conservative means  41-60% Severe Disability Pain remains the main problem in this group, but activities of daily living are affected. These patients require a detailed investigation  61-80% Crippled Back pain impinges on all aspects of the patients life. Positive intervention is required  81-100% Bed-bound These patients are either bed-bound or exaggerating their symptoms  Bluford FORBES [name], et al. Surgery versus conservative management of stable thoracolumbar fracture: the PRESTO feasibility RCT. [place] ([place]): [employer]; 2021 Nov. [facility] Technology Assessment, No. 25.62.) Appendix 3, Oswestry Disability Index category descriptors. Available from: [email or web address].cz  Minimally Clinically Important Difference (MCID) = 12.8%  MUSCLE LENGTH: Hamstrings: Right 45 deg; Left 45 deg Thomas test: PKB unremarkable  POSTURE: No Significant postural limitations  PALPATION: Globally tender to all paraspinal muscles  LUMBAR ROM:   AROM eval  Flexion 75%p!  Extension 75%p!  Right lateral flexion 75%  Left lateral flexion 75%  Right rotation 75%  Left rotation 50%   (Blank rows = not tested)  LOWER EXTREMITY ROM:   WFL throughout  Active  Right eval Left eval  Hip flexion    Hip extension    Hip abduction    Hip adduction    Hip internal rotation    Hip external rotation    Knee flexion    Knee extension    Ankle dorsiflexion    Ankle plantarflexion    Ankle inversion    Ankle eversion     (Blank rows = not tested)  LOWER EXTREMITY MMT:  see 30s chair stand test  MMT Right eval Left eval   Hip flexion    Hip extension    Hip abduction    Hip adduction    Hip internal rotation    Hip external rotation    Knee flexion  Knee extension    Ankle dorsiflexion    Ankle plantarflexion    Ankle inversion    Ankle eversion     (Blank rows = not tested)  LUMBAR SPECIAL TESTS:  Straight leg raise test: limited to 45d B due to soft tissue restrictions and pain and Slump test: Negative  FUNCTIONAL TESTS:  30 seconds chair stand test 2 reps  GAIT: Distance walked: 5ftx2 Assistive device utilized: None Level of assistance: Complete Independence Comments: slow and guarded  TREATMENT:     [facility] Adult PT Treatment:                                                DATE: 10/25/24 Therapeutic Exercise: Nustep L3 8 min  Therapeutic Activity:  Supine piriformis stretch (fig 4) 30s x2 B Supine hip flexor stretch 30s x2 B w/bolster Slant board stretch Gastroc/Soleus 30s x2 B Open book 10/10 incorporating breathing patterns B ITB stretch w/strap 30s x2 B  [facility] Adult PT Treatment:                                                DATE: 10/22/24 Therapeutic Exercise: Nustep L2 8 min   Therapeutic Activity: Seated hamstring stretch 30s x2 B (with towel to facilitate DF) Supine piriformis stretch (fig 4) 30s x2 B Supine hip flexor stretch 30s x2 B Slant board stretch Gastroc/Soleus 30s x2 B  [facility] Adult PT Treatment:                                                DATE: 10/18/24 Therapeutic Exercise: Nustep L2 6 min   Neuro Re-Ed: LAQ 3# x 10 B, x 10 with toe pointed on L only  Seated sciatic nerve glide x 15 B  Seated core brace x 3 mins STS with 8lb DB x 10  STS with core brace x 10  Supine core brace with march into leg extended x 7 B  LTR 5x5-10 hold B   [facility] Adult PT Treatment:                                                DATE: 10/15/24 Therapeutic Exercise: Nustep L2 1 min Unable to tolerate additional time   Therapeutic Activity: Seated hamstring stretch 30s x2  B Supine QL stretch 30s x2 B Supine hip flexor stretch 30s x2 B Supine OH flexion 10/10 Supine march 10/10 S/L clams 10/10      [facility] Adult PT Treatment:                                                DATE: 10/04/24 Eval and HEP Self Care: Additional minutes spent for educating on updated Therapeutic Home Exercise Program as well as comparing current status to condition at start of symptoms. This included exercises focusing on stretching,  strengthening, with focus on eccentric aspects. Long term goals include an improvement in range of motion, strength, endurance as well as avoiding reinjury. Patient's frequency would include in 1-2 times a day, 3-5 times a week for a duration of 6-12 weeks. Proper technique shown and discussed handout in great detail. All questions were discussed and addressed.                                                                                                              PATIENT EDUCATION:  Education details: Discussed eval findings, rehab rationale and POC and patient is in agreement  Person educated: Patient Education method: Explanation and Handouts Education comprehension: verbalized understanding and needs further education  HOME EXERCISE PROGRAM: Access Code: [email or web address] URL: [email or web address] Date: 10/04/2024 Prepared by: Bryson Palen  Exercises - Seated Table Hamstring Stretch  - 2-3 x daily - 5 x weekly - 3 sets - 10 reps - 30s hold - Seated Sciatic Tensioner  - 2-3 x daily - 5 x weekly - 3 sets - 10 reps - Supine Posterior Pelvic Tilt  - 2-3 x daily - 5 x weekly - 3 sets - 10 reps - 3s hold  Access Code: [email or web address] URL: [email or web address] Date: 10/18/2024 Prepared by: Marijo Berber  Exercises - Multifidus Knee Lift Off With Transverse Abdominis and Pelvic Floor Contraction  - 1 x daily - 7 x weekly - 3 sets - 10 reps - 5sec hold - Supine Active Straight Leg Raise  - 1 x daily - 7 x weekly - 1-2 sets - 10  reps - Supine 90/90 Sciatic Nerve Glide with Knee Flexion/Extension  - 1 x daily - 7 x weekly - 1-2 sets - 10 reps  ASSESSMENT:  CLINICAL IMPRESSION: Continued with stretch based activities.  Was able to tolerate an increase in resistance on aerobic component.  Observed patient to obtain stretch positions with less discomfort/struggle indicating increased overall mobility.  Incorporated open book stretch with breathing patterns.     Patient is a 24 y.o. female who was seen today for physical therapy evaluation and treatment for low back pain.  Symptoms are more global in nature extending from thoracic region to distal LE's.  Overall trunk mobility is [facility] but restricted by soft tissue discomfort/irritation.  Patient globally tender through paraspinal and LE and hip major muscle groups, similar to fibromyalgia symptoms.  Neural tension signs unremarkable to symptoms due to seem orthopedic in nature, possibly systemic.  Patient agreeable to trial of OPPT focused on stretching and mobility tasks.  OBJECTIVE IMPAIRMENTS: Abnormal gait, decreased activity tolerance, decreased endurance, decreased knowledge of condition, decreased mobility, difficulty walking, decreased ROM, decreased strength, increased fascial restrictions, impaired perceived functional ability, obesity, and pain.   ACTIVITY LIMITATIONS: carrying, lifting, bending, sitting, standing, squatting, sleeping, bed mobility, and caring for others  PERSONAL FACTORS: Age, Fitness, Past/current experiences, and Time since onset of injury/illness/exacerbation are also affecting patient's functional outcome.   REHAB POTENTIAL: Good  CLINICAL DECISION MAKING: Stable/uncomplicated  EVALUATION COMPLEXITY: Moderate   GOALS: Goals reviewed with patient?  No    SHORT TERM GOALS=LONG TERM GOALS: Target date: 11/29/2024    Patient will increase 30s chair stand reps from 2 to 4 with/without arms to demonstrate and improved functional ability with  less pain/difficulty as well as reduce fall risk.  Baseline: 2 Goal status: INITIAL  2.  Patient will score at least 15/50 on ODI to signify clinically meaningful improvement in functional abilities.   Baseline: 25/50 Goal status: INITIAL  3.  Patient will acknowledge 4/10 pain at least once during episode of care  Baseline: 6/10 Goal status: INITIAL  4.  Patient to demonstrate independence in HEP  Baseline: [id number] Goal status: INITIAL   PLAN:  PT FREQUENCY: 1-2x/week  PT DURATION: 6 weeks  PLANNED INTERVENTIONS: 97110-Therapeutic exercises, 97530- Therapeutic activity, [id number]- Neuromuscular re-education, 97535- Self Care, [id number]- Manual therapy, and Patient/Family education.  PLAN FOR NEXT SESSION: HEP review and update, manual techniques as appropriate, aerobic tasks, ROM and flexibility activities, strengthening and PREs, TPDN, gait and balance training,aquatic therapy, modalities for pain and NMRE      Rhyse Loux M Harrol Novello, PT 10/25/2024, 12:55 PM  "

## 2024-10-25 ENCOUNTER — Ambulatory Visit

## 2024-10-25 DIAGNOSIS — M5459 Other low back pain: Secondary | ICD-10-CM | POA: Diagnosis not present

## 2024-10-25 DIAGNOSIS — M6281 Muscle weakness (generalized): Secondary | ICD-10-CM

## 2024-10-25 DIAGNOSIS — M5416 Radiculopathy, lumbar region: Secondary | ICD-10-CM

## 2024-10-28 ENCOUNTER — Encounter: Payer: Self-pay | Admitting: Family Medicine

## 2024-10-28 ENCOUNTER — Telehealth: Admitting: Family Medicine

## 2024-10-28 ENCOUNTER — Ambulatory Visit: Admitting: Family Medicine

## 2024-10-28 DIAGNOSIS — J302 Other seasonal allergic rhinitis: Secondary | ICD-10-CM | POA: Insufficient documentation

## 2024-10-28 DIAGNOSIS — D509 Iron deficiency anemia, unspecified: Secondary | ICD-10-CM

## 2024-10-28 DIAGNOSIS — R7303 Prediabetes: Secondary | ICD-10-CM | POA: Insufficient documentation

## 2024-10-28 DIAGNOSIS — M7918 Myalgia, other site: Secondary | ICD-10-CM | POA: Diagnosis not present

## 2024-10-28 DIAGNOSIS — J4540 Moderate persistent asthma, uncomplicated: Secondary | ICD-10-CM

## 2024-10-28 DIAGNOSIS — F419 Anxiety disorder, unspecified: Secondary | ICD-10-CM | POA: Diagnosis not present

## 2024-10-28 DIAGNOSIS — E538 Deficiency of other specified B group vitamins: Secondary | ICD-10-CM | POA: Diagnosis not present

## 2024-10-28 DIAGNOSIS — F32A Depression, unspecified: Secondary | ICD-10-CM | POA: Diagnosis not present

## 2024-10-28 MED ORDER — ALBUTEROL SULFATE HFA 108 (90 BASE) MCG/ACT IN AERS: 2.0000 | INHALATION_SPRAY | Freq: Four times a day (QID) | RESPIRATORY_TRACT | 2 refills | Status: AC | PRN

## 2024-10-28 MED ORDER — BUDESONIDE-FORMOTEROL FUMARATE 160-4.5 MCG/ACT IN AERO: 2.0000 | INHALATION_SPRAY | Freq: Two times a day (BID) | RESPIRATORY_TRACT | 1 refills | Status: AC

## 2024-10-28 MED ORDER — FLUTICASONE PROPIONATE 50 MCG/ACT NA SUSP: 2.0000 | Freq: Every day | NASAL | 3 refills | Status: AC

## 2024-10-28 NOTE — Progress Notes (Signed)
 " MyChart Video Visit Virtual Visit via Video Note   This visit type was conducted w/patient consent. This format is felt to be most appropriate for this patient at this time. Physical exam was limited by quality of the video and audio technology used for the visit. CMA was able to get the patient set up on a video visit.  Patient location: Home. Patient and provider in visit Provider location: Office  I discussed the limitations of evaluation and management by telemedicine and the availability of in person appointments. The patient expressed understanding and agreed to proceed.  Visit Date: 10/28/2024  Today's healthcare provider: Jenkins CHRISTELLA Carrel, MD     Subjective:    Patient ID: Jessica Hendrix, female    DOB: 26-May-2001, 24 y.o.   MRN: [id number]  Chief Complaint  Patient presents with   Depression    Discussed the use of AI scribe software for clinical note transcription with the patient, who gave verbal consent to proceed.  History of Present Illness Jessica Hendrix is a 24 year old female with asthma who presents with increased use of albuterol  inhaler and musculoskeletal pain.  She has been using her albuterol  inhaler daily due to increased physical activity and cold weather, which exacerbate her asthma symptoms. She has previously used Symbicort  as a maintenance inhaler and requests a refill. She also uses Flonase  for allergies and requests a refill for this as well.  Her blood pressure has improved since discontinuing birth control pills, which previously caused sickness. Recent readings have been in the 110s. No chest pain, headaches, or dizziness are reported. She has an upcoming appointment to discuss further birth control options w/her gyn.  She experiences mixed anxiety and depression, which worsened postpartum. She has a history of therapy and is considering resuming it. No suicidal thoughts are present.  She has a history of musculoskeletal pain, particularly in the  mornings and after physical activity. She reports that her physical therapists mentioned a possible sciatic nerve issue and fibromyalgia, and told her that her muscles are constantly tense. This has been a long-standing issue since childhood, with previous physical therapy interventions.  Recent blood work indicated prediabetes, low iron stores, and low B12 levels. She has a family history of anemia and has struggled with maintaining adequate iron levels. She has not been taking prenatal vitamins recently, which were previously beneficial during pregnancy.    Past Medical History:  Diagnosis Date   Allergy    Anemia    Anxiety    Asthma    Constipation    Depression    Depression, major, single episode, moderate (HCC) 12/05/2019   GAD (generalized anxiety disorder) 03/08/2020   Gestational thrombocytopenia, third trimester 07/20/2023   Postpartum hypertension 09/19/2023   Pyelonephritis    Tooth pain 08/05/2024   Twitch 12/05/2019   UTI (urinary tract infection)     Past Surgical History:  Procedure Laterality Date   CESAREAN SECTION N/A 09/15/2023   Procedure: CESAREAN SECTION;  Surgeon: Erik Kieth BROCKS, MD;  Location: [facility] LD ORS;  Service: Obstetrics;  Laterality: N/A;   WISDOM TOOTH EXTRACTION      Outpatient Medications Prior to Visit  Medication Sig Dispense Refill   albuterol  (VENTOLIN  HFA) 108 (90 Base) MCG/ACT inhaler Inhale 2 puffs into the lungs every 6 (six) hours as needed for wheezing or shortness of breath. 6.7 g 0   fluticasone  (FLONASE ) 50 MCG/ACT nasal spray Place 2 sprays into both nostrils daily. 16 g 0   Fluticasone   Furoate 50 MCG/ACT AEPB      No facility-administered medications prior to visit.    Allergies[1]      Objective:     Physical Exam  Vitals and nursing note reviewed.  Constitutional:      General:  is not in acute distress.    Appearance: Normal appearance.  HENT:     Head: Normocephalic.  Pulmonary:     Effort: No respiratory  distress.  Skin:    General: Skin is dry.     Coloration: Skin is not pale.  Neurological:     Mental Status: Pt is alert and oriented to person, place, and time.  Psychiatric:        Mood and Affect: Mood normal.   Reviewed labs and notes from recent ov  LMP 09/13/2024 (Exact Date)   Wt Readings from Last 3 Encounters:  10/11/24 233 lb 12.8 oz (106.1 kg)  09/04/24 235 lb 3.2 oz (106.7 kg)  06/05/24 228 lb (103.4 kg)       Assessment & Plan:   Problem List Items Addressed This Visit       Respiratory   Moderate persistent asthma without complication - Primary   Relevant Medications   albuterol  (VENTOLIN  HFA) 108 (90 Base) MCG/ACT inhaler   budesonide -formoterol  (SYMBICORT ) 160-4.5 MCG/ACT inhaler   Seasonal allergic rhinitis   Relevant Medications   fluticasone  (FLONASE ) 50 MCG/ACT nasal spray     Other   B12 deficiency   Prediabetes   Other Visit Diagnoses       Iron deficiency anemia, unspecified iron deficiency anemia type         Myalgia, multiple sites         Anxiety and depression       Relevant Orders   Ambulatory referral to Behavioral Health       Assessment and Plan Assessment & Plan Moderate persistent asthma   Asthma is moderate and persistent, requiring daily albuterol . Increased activity and cold weather may exacerbate symptoms. Prescribed Symbicort , two puffs twice a day, with instructions to rinse mouth after use. Refilled albuterol  inhaler and Flonase  for allergy management.  Seasonal allergic rhinitis   Managed with Flonase . Refilled Flonase  for allergy management.  Mixed anxiety and depressive disorder   Anxiety and depression are mixed, worsening postpartum. Previous therapy was beneficial. No current suicidal ideation. Prefers therapy before medication. Referred to therapy at [facility] for counseling and educated on progressive relaxation exercises.  Chronic musculoskeletal pain with possible fibromyalgia   Chronic musculoskeletal  pain, especially in the morning, possibly related to anxiety. Physical therapy ongoing. Referred to sports medicine for further evaluation. Recommended turmeric and ginger supplements. Consider progressive relaxation exercises.  Elevated blood pressure   Blood pressure improved since discontinuing birth control pills. Continue to monitor blood pressure regularly.  Prediabetes   A1c indicates prediabetes. Emphasized lifestyle modifications to prevent progression to diabetes. Advised on diet and exercise modifications.  Iron deficiency without anemia   Iron stores are low, but no anemia present. B12 is low normal. Previous intolerance to iron supplements noted. Family history of anemia. Recommended prenatal vitamins to improve iron and B12 levels. Will monitor iron and B12 levels.       Meds ordered this encounter  Medications   fluticasone  (FLONASE ) 50 MCG/ACT nasal spray    Sig: Place 2 sprays into both nostrils daily.    Dispense:  48 g    Refill:  3   albuterol  (VENTOLIN  HFA) 108 (90 Base) MCG/ACT inhaler  Sig: Inhale 2 puffs into the lungs every 6 (six) hours as needed for wheezing or shortness of breath.    Dispense:  6.7 g    Refill:  2   budesonide -formoterol  (SYMBICORT ) 160-4.5 MCG/ACT inhaler    Sig: Inhale 2 puffs into the lungs 2 (two) times daily.    Dispense:  3 each    Refill:  1    I discussed the assessment and treatment plan with the patient. The patient was provided an opportunity to ask questions and all were answered. The patient agreed with the plan and demonstrated an understanding of the instructions.   The patient was advised to call back or seek an in-person evaluation if the symptoms worsen or if the condition fails to improve as anticipated.  Return in about 3 months (around 01/25/2025) for mood, iron/B12, prediabetes.  Jenkins CHRISTELLA Carrel, MD [facility] [phone] (phone) [phone] (fax)  [facility]     [1]  Allergies Allergen Reactions   Cinnamon     spices   Iodinated Contrast Media     MRI contrast   Penicillins Other (See Comments)    unk   Citrus Rash   "

## 2024-10-29 ENCOUNTER — Ambulatory Visit

## 2024-10-29 DIAGNOSIS — M6281 Muscle weakness (generalized): Secondary | ICD-10-CM

## 2024-10-29 DIAGNOSIS — M5416 Radiculopathy, lumbar region: Secondary | ICD-10-CM

## 2024-10-29 DIAGNOSIS — M5459 Other low back pain: Secondary | ICD-10-CM

## 2024-10-31 NOTE — Therapy (Unsigned)
 " OUTPATIENT PHYSICAL THERAPY TREATMENT NOTE   Patient Name: Jessica Hendrix MRN: [id number] DOB:10-02-2000, 24 y.o., female Today's Date: 11/01/2024  END OF SESSION:  PT End of Session - 11/01/24 1319     Visit Number 7    Number of Visits 12    Date for Recertification  12/02/24    Authorization Type [facility] [employer]    Authorization Time Period after 25 visits    PT Start Time 1319    PT Stop Time 1345    PT Time Calculation (min) 26 min    Activity Tolerance Patient tolerated treatment well    Behavior During Therapy [facility] for tasks assessed/performed               Past Medical History:  Diagnosis Date   Allergy    Anemia    Anxiety    Asthma    Constipation    Depression    Depression, major, single episode, moderate (HCC) 12/05/2019   GAD (generalized anxiety disorder) 03/08/2020   Gestational thrombocytopenia, third trimester 07/20/2023   Postpartum hypertension 09/19/2023   Pyelonephritis    Tooth pain 08/05/2024   Twitch 12/05/2019   UTI (urinary tract infection)    Past Surgical History:  Procedure Laterality Date   CESAREAN SECTION N/A 09/15/2023   Procedure: CESAREAN SECTION;  Surgeon: Jessica Kieth BROCKS, MD;  Location: [facility] LD ORS;  Service: Obstetrics;  Laterality: N/A;   WISDOM TOOTH EXTRACTION     Patient Active Problem List   Diagnosis Date Noted   Seasonal allergic rhinitis 10/28/2024   Prediabetes 10/28/2024   B12 deficiency 10/28/2024   Moderate persistent asthma without complication 01/13/2023    PCP: Jessica Jenkins Jansky, MD   REFERRING PROVIDER: Orlean Suzen Lacks, NP  REFERRING DIAG: [phone] (ICD-10-CM) - Radiculopathy, lumbar region  Rationale for Evaluation and Treatment: Rehabilitation  THERAPY DIAG:  Other low back pain  Radiculopathy, lumbar region  Muscle weakness (generalized)  ONSET DATE: March 2024  SUBJECTIVE:                                                                                                                                                                                            SUBJECTIVE STATEMENT:  No changes since last session.     Very pleasant out of 24 year old comes in today as a follow-up for her MRI. She continues to have back pain and left leg pain in her buttocks and hip. She denies any numbness or tingling. She did have an epidural when she was in labor. She had a spinal fluid leak after that. They did a blood patch. The pain is pretty  severe at times. She rates it a 6/10, constant, and achy. Activity makes it worse. Denies any change in bowel bladder habits.  PERTINENT HISTORY:  Relates a history of tight tendons as a child requiring PT.   PAIN:  Are you having pain? Yes: NPRS scale: 6/10 Pain location: Low back Pain description: ache  Aggravating factors: activity and prolonged weight bearing tasks including sitting Relieving factors: lying/sitting in a certain position  PRECAUTIONS: None  RED FLAGS: None   WEIGHT BEARING RESTRICTIONS: No  FALLS:  Has patient fallen in last 6 months? No  OCCUPATION: Jessica Hendrix  PLOF: Independent  PATIENT GOALS: To manage my back symptoms  NEXT MD VISIT: TBD  OBJECTIVE:  Note: Objective measures were completed at Evaluation unless otherwise noted.  DIAGNOSTIC FINDINGS:  IMPRESSION: 1. Tiny dorsal subdural fluid collection at the thoracolumbar junction and indistinct appearance of the cauda equina nerve roots following recent total spine Myelography, probably are incidental findings. No abnormal thecal sac or nerve root enhancement. No evidence of an epidural process. No evidence of spinal infection. 2. Stable isolated L4-L5 disc degeneration with no significant stenosis.     Electronically Signed   By: Jessica Hendrix M.D.   On: 12/03/2023 05:59  PATIENT SURVEYS:  Modified Oswestry:   Interpretation of scores: Score Category Description  0-20% Minimal Disability The patient can cope with most living activities.  Usually no treatment is indicated apart from advice on lifting, sitting and exercise  21-40% Moderate Disability The patient experiences more pain and difficulty with sitting, lifting and standing. Travel and social life are more difficult and they may be disabled from work. Personal care, sexual activity and sleeping are not grossly affected, and the patient can usually be managed by conservative means  41-60% Severe Disability Pain remains the main problem in this group, but activities of daily living are affected. These patients require a detailed investigation  61-80% Crippled Back pain impinges on all aspects of the patients life. Positive intervention is required  81-100% Bed-bound These patients are either bed-bound or exaggerating their symptoms  Jessica Hendrix, et al. Surgery versus conservative management of stable thoracolumbar fracture: the PRESTO feasibility RCT. [place] ([place]): [employer]; 2021 Nov. [facility] Technology Assessment, No. 25.62.) Appendix 3, Oswestry Disability Index category descriptors. Available from: Findjewelers.cz  Minimally Clinically Important Difference (MCID) = 12.8%  MUSCLE LENGTH: Hamstrings: Right 45 deg; Left 45 deg Thomas test: PKB unremarkable  POSTURE: No Significant postural limitations  PALPATION: Globally tender to all paraspinal muscles  LUMBAR ROM:   AROM eval  Flexion 75%p!  Extension 75%p!  Right lateral flexion 75%  Left lateral flexion 75%  Right rotation 75%  Left rotation 50%   (Blank rows = not tested)  LOWER EXTREMITY ROM:   WFL throughout  Active  Right eval Left eval  Hip flexion    Hip extension    Hip abduction    Hip adduction    Hip internal rotation    Hip external rotation    Knee flexion    Knee extension    Ankle dorsiflexion    Ankle plantarflexion    Ankle inversion    Ankle eversion     (Blank rows = not tested)  LOWER EXTREMITY MMT:  see 30s  chair stand test  MMT Right eval Left eval  Hip flexion    Hip extension    Hip abduction    Hip adduction    Hip internal rotation  Hip external rotation    Knee flexion    Knee extension    Ankle dorsiflexion    Ankle plantarflexion    Ankle inversion    Ankle eversion     (Blank rows = not tested)  LUMBAR SPECIAL TESTS:  Straight leg raise test: limited to 45d B due to soft tissue restrictions and pain and Slump test: Negative  FUNCTIONAL TESTS:  30 seconds chair stand test 2 reps  GAIT: Distance walked: 65ftx2 Assistive device utilized: None Level of assistance: Complete Independence Comments: slow and guarded  TREATMENT:     [facility] Adult PT Treatment:                                                DATE: 11/01/24 Therapeutic Exercise: Nustep L4 6 min 60 SPM  Therapeutic Activity: Supine p-ball curl ups 10x B 10/10 Supine hip fallouts BluTB 10x B, 10/10 Supine bridge BluTB 10x S/L clams 10/10 Bird dog 10/10  [facility] Adult PT Treatment:                                                DATE: 10/29/24 Therapeutic Exercise: Nustep L4 8 min   Therapeutic Activity: Supine piriformis stretch (fig 4) 30s x2 B Supine hip flexor stretch 30s x2 B w/bolster Slant board stretch Gastroc/Soleus 30s x2 B Open book 10/10 incorporating breathing patterns B ITB stretch w/strap 30s x2 B  [facility] Adult PT Treatment:                                                DATE: 10/25/24 Therapeutic Exercise: Nustep L3 8 min  Therapeutic Activity:  Supine piriformis stretch (fig 4) 30s x2 B Supine hip flexor stretch 30s x2 B w/bolster Slant board stretch Gastroc/Soleus 30s x2 B Open book 10/10 incorporating breathing patterns B ITB stretch w/strap 30s x2 B  [facility] Adult PT Treatment:                                                DATE: 10/22/24 Therapeutic Exercise: Nustep L2 8 min   Therapeutic Activity: Seated hamstring stretch 30s x2 B (with towel to facilitate DF) Supine piriformis  stretch (fig 4) 30s x2 B Supine hip flexor stretch 30s x2 B Slant board stretch Gastroc/Soleus 30s x2 B  [facility] Adult PT Treatment:                                                DATE: 10/18/24 Therapeutic Exercise: Nustep L2 6 min   Neuro Re-Ed: LAQ 3# x 10 B, x 10 with toe pointed on L only  Seated sciatic nerve glide x 15 B  Seated core brace x 3 mins STS with 8lb DB x 10  STS with core brace x 10  Supine core brace with march into leg  extended x 7 B  LTR 5x5-10 hold B   [facility] Adult PT Treatment:                                                DATE: 10/15/24 Therapeutic Exercise: Nustep L2 1 min Unable to tolerate additional time   Therapeutic Activity: Seated hamstring stretch 30s x2 B Supine QL stretch 30s x2 B Supine hip flexor stretch 30s x2 B Supine OH flexion 10/10 Supine march 10/10 S/L clams 10/10      [facility] Adult PT Treatment:                                                DATE: 10/04/24 Eval and HEP Self Care: Additional minutes spent for educating on updated Therapeutic Home Exercise Program as well as comparing current status to condition at start of symptoms. This included exercises focusing on stretching, strengthening, with focus on eccentric aspects. Long term goals include an improvement in range of motion, strength, endurance as well as avoiding reinjury. Patient's frequency would include in 1-2 times a day, 3-5 times a week for a duration of 6-12 weeks. Proper technique shown and discussed handout in great detail. All questions were discussed and addressed.                                                                                                              PATIENT EDUCATION:  Education details: Discussed eval findings, rehab rationale and POC and patient is in agreement  Person educated: Patient Education method: Explanation and Handouts Education comprehension: verbalized understanding and needs further education  HOME EXERCISE PROGRAM: Access Code:  [email or web address] URL: [email or web address] Date: 10/29/2024 Prepared by: Bernetta Sutley  Exercises - Seated Table Hamstring Stretch  - 1-2 x daily - 3-5 x weekly - 1 sets - 2 reps - 30s hold - Modified Thomas Stretch  - 1-2 x daily - 3-5 x weekly - 1 sets - 2 reps - 30s hold - Supine Piriformis Stretch with Foot on Ground  - 1-2 x daily - 3-5 x weekly - 1 sets - 2 reps - 30s hold - Supine ITB Stretch with Strap  - 1-2 x daily - 3-5 x weekly - 1 sets - 2 reps - 30s hold - Standing Bilateral Gastroc Stretch with Step  - 1-2 x daily - 3-5 x weekly - 1 sets - 2 reps - 30s hold  Access Code: [email or web address] URL: [email or web address] Date: 10/18/2024 Prepared by: Marijo Berber  Exercises - Multifidus Knee Lift Off With Transverse Abdominis and Pelvic Floor Contraction  - 1 x daily - 7 x weekly - 3 sets - 10 reps - 5sec hold - Supine Active Straight Leg Raise  - 1 x daily - 7 x weekly - 1-2 sets -  10 reps - Supine 90/90 Sciatic Nerve Glide with Knee Flexion/Extension  - 1 x daily - 7 x weekly - 1-2 sets - 10 reps  ASSESSMENT:  CLINICAL IMPRESSION: Scope of session limited due to late arrival.  Focus of treatment was aerobic warm up followed by core and lumbosacral strengthening.  Incorporated quadruped exercises for spine strength and functional proprioception.   Patient is a 24 y.o. female who was seen today for physical therapy evaluation and treatment for low back pain.  Symptoms are more global in nature extending from thoracic region to distal LE's.  Overall trunk mobility is [facility] but restricted by soft tissue discomfort/irritation.  Patient globally tender through paraspinal and LE and hip major muscle groups, similar to fibromyalgia symptoms.  Neural tension signs unremarkable to symptoms due to seem orthopedic in nature, possibly systemic.  Patient agreeable to trial of OPPT focused on stretching and mobility tasks.  OBJECTIVE IMPAIRMENTS: Abnormal gait, decreased  activity tolerance, decreased endurance, decreased knowledge of condition, decreased mobility, difficulty walking, decreased ROM, decreased strength, increased fascial restrictions, impaired perceived functional ability, obesity, and pain.   ACTIVITY LIMITATIONS: carrying, lifting, bending, sitting, standing, squatting, sleeping, bed mobility, and caring for others  PERSONAL FACTORS: Age, Fitness, Past/current experiences, and Time since onset of injury/illness/exacerbation are also affecting patient's functional outcome.   REHAB POTENTIAL: Good  CLINICAL DECISION MAKING: Stable/uncomplicated  EVALUATION COMPLEXITY: Moderate   GOALS: Goals reviewed with patient? No    SHORT TERM GOALS=LONG TERM GOALS: Target date: 11/29/2024    Patient will increase 30s chair stand reps from 2 to 4 with/without arms to demonstrate and improved functional ability with less pain/difficulty as well as reduce fall risk.  Baseline: 2 Goal status: INITIAL  2.  Patient will score at least 15/50 on ODI to signify clinically meaningful improvement in functional abilities.   Baseline: 25/50 Goal status: INITIAL  3.  Patient will acknowledge 4/10 pain at least once during episode of care  Baseline: 6/10 Goal status: INITIAL  4.  Patient to demonstrate independence in HEP  Baseline: [id number] Goal status: INITIAL   PLAN:  PT FREQUENCY: 1-2x/week  PT DURATION: 6 weeks  PLANNED INTERVENTIONS: 97110-Therapeutic exercises, 97530- Therapeutic activity, [id number]- Neuromuscular re-education, 97535- Self Care, [id number]- Manual therapy, and Patient/Family education.  PLAN FOR NEXT SESSION: HEP review and update, manual techniques as appropriate, aerobic tasks, ROM and flexibility activities, strengthening and PREs, TPDN, gait and balance training,aquatic therapy, modalities for pain and NMRE      Lakina Mcintire M Raeshawn Tafolla, PT 11/01/2024, 1:45 PM  "

## 2024-11-01 ENCOUNTER — Encounter: Payer: Self-pay | Admitting: Family Medicine

## 2024-11-01 ENCOUNTER — Ambulatory Visit

## 2024-11-01 ENCOUNTER — Ambulatory Visit: Admitting: Family Medicine

## 2024-11-01 VITALS — BP 118/74 | HR 82 | Temp 97.2°F | Ht 66.5 in | Wt 238.4 lb

## 2024-11-01 DIAGNOSIS — M5459 Other low back pain: Secondary | ICD-10-CM

## 2024-11-01 DIAGNOSIS — M6281 Muscle weakness (generalized): Secondary | ICD-10-CM

## 2024-11-01 DIAGNOSIS — J4540 Moderate persistent asthma, uncomplicated: Secondary | ICD-10-CM

## 2024-11-01 DIAGNOSIS — M5416 Radiculopathy, lumbar region: Secondary | ICD-10-CM

## 2024-11-01 NOTE — Patient Instructions (Signed)
 It was very nice to see you today!  Congrats on new job!!   PLEASE NOTE:  If you had any lab tests please let us  know if you have not heard back within a few days. You may see your results on MyChart before we have a chance to review them but we will give you a call once they are reviewed by us . If we ordered any referrals today, please let us  know if you have not heard from their office within the next week.   Please try these tips to maintain a healthy lifestyle:  Eat most of your calories during the day when you are active. Eliminate processed foods including packaged sweets (pies, cakes, cookies), reduce intake of potatoes, white bread, white pasta, and white rice. Look for whole grain options, oat flour or almond flour.  Each meal should contain half fruits/vegetables, one quarter protein, and one quarter carbs (no bigger than a computer mouse).  Cut down on sweet beverages. This includes juice, soda, and sweet tea. Also watch fruit intake, though this is a healthier sweet option, it still contains natural sugar! Limit to 3 servings daily.  Drink at least 1 glass of water  with each meal and aim for at least 8 glasses per day  Exercise at least 150 minutes every week.

## 2024-11-01 NOTE — Progress Notes (Signed)
 "  Subjective:     Patient ID: Jessica Hendrix, female    DOB: [birth year], 24 y.o.   MRN: [id number]  Chief Complaint  Patient presents with   Letter for School/Work    Pt has a form that needs to be filled out for new job    Discussed the use of AI scribe software for clinical note transcription with the patient, who gave verbal consent to proceed.  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  The patient denies having any physical conditions that would limit her ability to work with children. The patient denies currently being under any treatment or taking any medications that would affect her ability to work with children.  She has a history of anxiety, which she states is related to life circumstances and does not affect her capability to work with children.  No compromised immune system, unexplained cough, fever, night sweats, shortness of breath, chest pain, or unexplained fatigue. She has not traveled outside the [place]  for more than one month, nor has she been exposed to tuberculosis or used illicit drugs.  Asthma-doing well on meds    There are no preventive care reminders to display for this patient.   Past Medical History:  Diagnosis Date   Allergy    Anemia    Anxiety    Asthma    Constipation    Depression    Depression, major, single episode, moderate (HCC) 12/05/2019   GAD (generalized anxiety disorder) 03/08/2020   Gestational thrombocytopenia, third trimester 07/20/2023   Postpartum hypertension 09/19/2023   Pyelonephritis    Tooth pain 08/05/2024   Twitch 12/05/2019   UTI (urinary tract infection)     Past Surgical History:  Procedure Laterality Date   CESAREAN SECTION N/A 09/15/2023   Procedure: CESAREAN SECTION;  Surgeon: Erik Kieth BROCKS, MD;  Location: [facility] LD ORS;  Service: Obstetrics;  Laterality: N/A;   WISDOM TOOTH EXTRACTION      Current  Medications[1]  Allergies[2] ROS neg/noncontributory except as noted HPI/below      Objective:     BP 118/74 (BP Location: Left Arm, Patient Position: Sitting, Cuff Size: Normal)   Pulse 82   Temp (!) 97.2 F (36.2 C) (Temporal)   Ht 5' 6.5 (1.689 m)   Wt 238 lb 6 oz (108.1 kg)   LMP 10/17/2024   SpO2 98%   BMI 37.90 kg/m  Wt Readings from Last 3 Encounters:  11/01/24 238 lb 6 oz (108.1 kg)  10/11/24 233 lb 12.8 oz (106.1 kg)  09/04/24 235 lb 3.2 oz (106.7 kg)    Physical Exam GENERAL: Well developed, well nourished, no acute distress. HEAD EYES EARS NOSE THROAT: Normocephalic, atraumatic, conjunctiva not injected, sclera nonicteric. CARDIAC: Regular rate and rhythm, S1 S2 present, no murmur. LUNGS: Clear to auscultation bilaterally, no wheezes. EXTREMITIES: No edema. MUSCULOSKELETAL: No gross abnormalities. NEUROLOGICAL: Alert and oriented x3, cranial nerves II through XII intact. PSYCHIATRIC: Normal mood, good eye contact.  Form filled out.        Assessment & Plan:  Moderate persistent asthma without complication    Assessment and Plan Assessment & Plan General Health Maintenance   She reports no new surgeries or significant changes in health status. There are no physical or emotional conditions affecting her ability to work with children, and no current treatments or medications impacting her work. Her immune system is uncompromised, with  no exposure to TB or symptoms of infectious diseases. She has not traveled outside the [place]  for more than one month and has no history of crack cocaine use or incarceration. She experiences no unexplained symptoms such as cough, fever, night sweats, shortness of breath, chest pain, or fatigue. 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 No TB test is required as all screening questions were negative.  Asthma-doing well on symbicort  and flonase .  continue     Return if symptoms worsen or fail to improve.  Jenkins CHRISTELLA Carrel, MD     [1]  Current Outpatient Medications:    albuterol  (VENTOLIN  HFA) 108 (90 Base) MCG/ACT inhaler, Inhale 2 puffs into the lungs every 6 (six) hours as needed for wheezing or shortness of breath., Disp: 6.7 g, Rfl: 2   budesonide -formoterol  (SYMBICORT ) 160-4.5 MCG/ACT inhaler, Inhale 2 puffs into the lungs 2 (two) times daily., Disp: 3 each, Rfl: 1   fluticasone  (FLONASE ) 50 MCG/ACT nasal spray, Place 2 sprays into both nostrils daily., Disp: 48 g, Rfl: 3 [2]  Allergies Allergen Reactions   Cinnamon     spices   Iodinated Contrast Media     MRI contrast   Penicillins Other (See Comments)    unk   Citrus Rash   "

## 2024-11-04 ENCOUNTER — Ambulatory Visit: Payer: Self-pay | Admitting: Obstetrics and Gynecology

## 2024-11-04 ENCOUNTER — Institutional Professional Consult (permissible substitution) (INDEPENDENT_AMBULATORY_CARE_PROVIDER_SITE_OTHER): Admitting: Physician Assistant

## 2024-11-05 ENCOUNTER — Ambulatory Visit

## 2024-11-07 ENCOUNTER — Ambulatory Visit

## 2024-11-12 ENCOUNTER — Ambulatory Visit

## 2024-11-15 ENCOUNTER — Ambulatory Visit

## 2025-01-27 ENCOUNTER — Ambulatory Visit: Admitting: Family Medicine
